# Patient Record
Sex: Female | Born: 1940
Health system: Southern US, Community
[De-identification: ages and names within clinical notes are randomized; demographics above are authoritative.]

## PROBLEM LIST (undated history)

## (undated) DIAGNOSIS — H409 Unspecified glaucoma: Secondary | ICD-10-CM

## (undated) DIAGNOSIS — C801 Malignant (primary) neoplasm, unspecified: Secondary | ICD-10-CM

## (undated) DIAGNOSIS — E119 Type 2 diabetes mellitus without complications: Secondary | ICD-10-CM

## (undated) DIAGNOSIS — R0600 Dyspnea, unspecified: Secondary | ICD-10-CM

## (undated) DIAGNOSIS — E785 Hyperlipidemia, unspecified: Secondary | ICD-10-CM

## (undated) DIAGNOSIS — M1990 Unspecified osteoarthritis, unspecified site: Secondary | ICD-10-CM

## (undated) DIAGNOSIS — F32A Depression, unspecified: Secondary | ICD-10-CM

## (undated) DIAGNOSIS — F329 Major depressive disorder, single episode, unspecified: Secondary | ICD-10-CM

## (undated) DIAGNOSIS — I10 Essential (primary) hypertension: Secondary | ICD-10-CM

## (undated) DIAGNOSIS — Z972 Presence of dental prosthetic device (complete) (partial): Secondary | ICD-10-CM

## (undated) DIAGNOSIS — Z9989 Dependence on other enabling machines and devices: Secondary | ICD-10-CM

## (undated) HISTORY — PX: EYE SURGERY: SHX253

## (undated) HISTORY — PX: ABDOMINAL HYSTERECTOMY: SHX81

---

## 1998-01-18 ENCOUNTER — Other Ambulatory Visit: Admission: RE | Admit: 1998-01-18 | Discharge: 1998-01-18 | Payer: Self-pay | Admitting: Obstetrics and Gynecology

## 2001-10-16 ENCOUNTER — Encounter: Admission: RE | Admit: 2001-10-16 | Discharge: 2001-10-16 | Payer: Self-pay | Admitting: *Deleted

## 2001-10-31 ENCOUNTER — Encounter (INDEPENDENT_AMBULATORY_CARE_PROVIDER_SITE_OTHER): Payer: Self-pay | Admitting: *Deleted

## 2001-10-31 ENCOUNTER — Ambulatory Visit (HOSPITAL_BASED_OUTPATIENT_CLINIC_OR_DEPARTMENT_OTHER): Admission: RE | Admit: 2001-10-31 | Discharge: 2001-10-31 | Payer: Self-pay | Admitting: *Deleted

## 2002-07-02 ENCOUNTER — Encounter: Admission: RE | Admit: 2002-07-02 | Discharge: 2002-07-02 | Payer: Self-pay | Admitting: *Deleted

## 2002-12-26 ENCOUNTER — Encounter: Admission: RE | Admit: 2002-12-26 | Discharge: 2002-12-26 | Payer: Self-pay | Admitting: *Deleted

## 2003-03-09 ENCOUNTER — Ambulatory Visit (HOSPITAL_BASED_OUTPATIENT_CLINIC_OR_DEPARTMENT_OTHER): Admission: RE | Admit: 2003-03-09 | Discharge: 2003-03-09 | Payer: Self-pay | Admitting: Orthopedic Surgery

## 2003-03-09 ENCOUNTER — Ambulatory Visit (HOSPITAL_COMMUNITY): Admission: RE | Admit: 2003-03-09 | Discharge: 2003-03-09 | Payer: Self-pay | Admitting: Orthopedic Surgery

## 2003-03-18 ENCOUNTER — Emergency Department (HOSPITAL_COMMUNITY): Admission: AD | Admit: 2003-03-18 | Discharge: 2003-03-18 | Payer: Self-pay | Admitting: Family Medicine

## 2004-01-04 ENCOUNTER — Encounter: Admission: RE | Admit: 2004-01-04 | Discharge: 2004-01-04 | Payer: Self-pay | Admitting: *Deleted

## 2005-01-27 ENCOUNTER — Encounter: Admission: RE | Admit: 2005-01-27 | Discharge: 2005-01-27 | Payer: Self-pay | Admitting: *Deleted

## 2005-04-10 HISTORY — PX: CERVICAL FUSION: SHX112

## 2005-07-10 ENCOUNTER — Encounter: Admission: RE | Admit: 2005-07-10 | Discharge: 2005-07-10 | Payer: Self-pay | Admitting: Endocrinology

## 2005-07-14 ENCOUNTER — Inpatient Hospital Stay (HOSPITAL_COMMUNITY): Admission: EM | Admit: 2005-07-14 | Discharge: 2005-07-20 | Payer: Self-pay | Admitting: Emergency Medicine

## 2005-07-19 ENCOUNTER — Encounter: Payer: Self-pay | Admitting: Anesthesiology

## 2006-01-31 ENCOUNTER — Encounter: Admission: RE | Admit: 2006-01-31 | Discharge: 2006-01-31 | Payer: Self-pay | Admitting: *Deleted

## 2007-02-12 ENCOUNTER — Encounter: Admission: RE | Admit: 2007-02-12 | Discharge: 2007-02-12 | Payer: Self-pay | Admitting: *Deleted

## 2010-08-26 NOTE — Op Note (Signed)
NAME:  JOSEY, DETTMANN                        ACCOUNT NO.:  [id number]   MEDICAL RECORD NO.:  [id number]                   PATIENT TYPE:  AMB   LOCATION:  DSC                                  FACILITY:  [facility]   PHYSICIAN:  Harvie Junior, M.D.                DATE OF BIRTH:  1940-10-20   DATE OF PROCEDURE:  03/09/2003  DATE OF DISCHARGE:                                 OPERATIVE REPORT   PREOPERATIVE DIAGNOSIS:  Plantar fasciitis left.   POSTOPERATIVE DIAGNOSIS:  Plantar fasciitis left.   PROCEDURE:  Left plantar fascia release, endoscopic.   SURGEON:  Harvie Junior, M.D.   ASSISTANT:  Marshia Ly, P.A.   ANESTHESIA:  General.   INDICATIONS FOR PROCEDURE:  A 70 year old female with a long history of  having significant plantar fascial pain on the left side.  She ultimately  had been evaluated and undergone multiple injection procedures which had  worked, but her pain ultimately recurred with physical therapy that included  stretching and icing.  She had been diligent with all of these by report,  but ultimately had failure of this treatment, so she was brought to the  operating room for endoscopic plantar fascia release.   DESCRIPTION OF PROCEDURE:  The patient was brought to the operating room and  after adequate anesthesia was obtained with a general anesthetic, the  patient was placed supine on the operating table.  The left leg was prepped  and draped in the usual sterile fashion.  Following this, an incision was  made in the skin at the intersection of the skin line at the heel with the  portion of the medial malleolus.  Where these two lines intersected, a small  incision was made, and the skin was dissected down to the level of the heel.  At this point, the cannula was placed through the heel. The plantar fascia  was identified off the superior and inferior surface.  This was freed above  and below and then a cannula was placed just below the endoscopic plantar  fascia.  At this point, a triangular blade was used.  Clearly  endoscopically, the plantar fascia could be identified.  This was then  divided with the triangular blade.  Once this was divided, the fascia could  be seen to gap open.  This was done under endoscopic guidance.  At this  point, the cannula was removed.  The probe was used again to see if there  were any remaining bands of the endoscopic fiber.  There were a few  remaining bands on the lateral side. This was divided with an osteotome and  then a check was made across the plantar aspect of the foot with the  osteotome to make sure that there were no further bands connecting to the  heel.  At this point, the wound was copiously irrigated and suctioned dry.  The portals were then closed with  a single nylon interrupted stitch.  10 mL  of 0.25% Marcaine was instilled into the heel for postoperative pain relief  and the patient was then taken to the recovery room where she was noted to  be in satisfactory condition after a sterile compressive dressing had been  applied.                                               Harvie Junior, M.D.    Ranae Plumber  D:  03/09/2003  T:  03/09/2003  Job:  [id number]

## 2010-08-26 NOTE — Discharge Summary (Signed)
NAME:  JASHLEY, YELLIN              ACCOUNT NO.:  [id number]   MEDICAL RECORD NO.:  [id number]            PATIENT TYPE:   LOCATION:                                 FACILITY:   PHYSICIAN:  Alfonse Alpers. Dagoberto Ligas, M.D.     DATE OF BIRTH:   DATE OF ADMISSION:  DATE OF DISCHARGE:                                 DISCHARGE SUMMARY   This is a 70 year old woman who presents with a history of neck pain.  This  has been present for approximately seven days prior to admission.  It  started rather acutely in the morning.  There is no history of trauma.  A  cervical spine x-ray previously showed some degenerative changes.  She has  had increasing persistence and pain with numbness in her right arm.  She had  significant pain and required pain relief.   She also has a history of diabetes mellitus type 2 requiring insulin and  this has been well controlled with her usual insulin regimen.  She also has  a history of nonspecific chest pain.  This resolved spontaneously.   PHYSICAL EXAMINATION:  Revealed a well-developed woman who was in no  distress but is experiencing severe pain in her neck.  Her lungs were clear  and cardiovascular exam and rhythm was regular.   IMPRESSION:  1. Neck pain secondary to radiculopathy.  2. Type 2 diabetes requiring insulin.   HOSPITAL COURSE:  She was admitted to the hospital and started on  analgesics.  She was seen by the neurosurgical service.  A MRI was done.  During this time, she has still required IV analgesics.  The MR scan showed  a spondylosis and stenosis in the right C6/C7 herniated disc.  She was seen  in consultation by Dr. Kathaleen Maser. Pool who did surgery.  At that point, the  patient was transferred to the service of Dr. Jordan Likes.   DISCHARGE DIAGNOSES:  1. Herniated nucleus pulposus.  2. Diabetes mellitus type 2 requiring insulin.   DISCHARGE MEDICATIONS:  She will continue taking the same medications she  has been taking prior to her admission and other  medications from Dr. Jordan Likes  are to be noted.   PLAN IN FOLLOW-UP:  She will be seen in the office in a period of two to  three weeks.   CONDITION ON DISCHARGE:  Improved.           ______________________________  Alfonse Alpers Dagoberto Ligas, M.D.     CGG/MEDQ  D:  02/24/2006  T:  02/24/2006  Job:  [id number]

## 2010-08-26 NOTE — H&P (Signed)
NAME:  Kayla Griffin, Kayla Griffin              ACCOUNT NO.:  [id number]   MEDICAL RECORD NO.:  [id number]          PATIENT TYPE:  INP   LOCATION:  1417                         FACILITY:  [facility]   PHYSICIAN:  Alfonse Alpers. Gegick, M.D.DATE OF BIRTH:  01-26-1941   DATE OF ADMISSION:  07/14/2005  DATE OF DISCHARGE:                                HISTORY & PHYSICAL   CHIEF COMPLAINT:  This is a 70 year old woman who presents with a history of  neck pain and chest pain.   HISTORY OF PRESENT ILLNESS:  The patient began having symptoms of neck pain  beginning approximately five to seven days prior to this admission.  She  awoke with the pain, did not have any history of trauma, and was seen in the  office.  She had significant discomfort and an x-ray of her cervical spine  was found, and this was consistent with degenerative joint disease.  She was  treated with analgesics, but continues to have severe pain.  The pain is  increasing and the pain is also located in her chest area.  The chest pain  was one of the presenting symptoms when she presented to the emergency room,  and this resolved spontaneously.  The pain was associated with her shoulder  pain on the right side also.  Associated with the pain, is a sense of  numbness in the right hand.   She has a history of type 2 diabetes mellitus.  This has been present since  1974.  Her control has generally been pretty good with a hemoglobin A1C of  6.7%.  Her usual hemoglobin A1C's are in the 7 range or less than 7 range.   She has a history of dyslipidemia, for which she is taking medications, and  also has a history of hypertension and esophageal reflux.  She has  osteopenia with a bone density level T score of  -2.1.   PAST MEDICAL HISTORY:  Essentially negative except for that mentioned above.   FAMILY HISTORY:  Noncontributory.   PERSONAL HISTORY:  She does not smoke or drink excessive amounts of alcohol.   ALLERGIES:  No history of  allergies.   MEDICATIONS PRIOR TO THIS ADMISSION:  1.  Novolin 70/30 mix 15 in the morning and 100 at suppertime.  2.  Actos 45 mg daily.  3.  __________ 10 mcg b.i.d.  4.  Lopressor 100 mg one daily.  5.  Maxzide one daily.  6.  Lipitor 20 mg 1/2 daily.  7.  Aciphex 20 mg one daily.  8.  Enteric coated aspirin 81 mg one daily.   REVIEW OF SYSTEMS:  Her weight has been stable.  CARDIOVASCULAR/RESPIRATORY:  See above.  GASTROINTESTINAL:  See above.  GENITOURINARY:  No complaints.   PHYSICAL EXAMINATION:  GENERAL:  This is a well-developed woman who appears  markedly uncomfortable.  HEENT:  Her head is normocephalic.  NECK:  Supple.  She is able to move her neck without pain.  No tenderness is  present.  LUNGS:  Clear.  BREASTS:  No masses are present.  CARDIOVASCULAR:  Rhythm is regular.  ABDOMEN:  Soft, no tenderness is present.  EXTREMITIES:  Normal.  NEUROMUSCULAR:  Grossly intact.   IMPRESSION:  1.  Cervical spine pain with radiculopathy.  2.  Type 2 diabetes.  3.  Chest pain, not related to cardiac disease, probably secondary to      referred pain from neck.  4.  History of dyslipidemia.  5.  History of hypertension.           ______________________________  Alfonse Alpers Dagoberto Ligas, M.D.     CGG/MEDQ  D:  07/15/2005  T:  07/15/2005  Job:  [id number]

## 2010-08-26 NOTE — Op Note (Signed)
Kayla Griffin, Kayla Griffin              ACCOUNT NO.:  [id number]   MEDICAL RECORD NO.:  [id number]          PATIENT TYPE:  INP   LOCATION:  3016                         FACILITY:  [facility]   PHYSICIAN:  Kathaleen Maser. Pool, M.D.    DATE OF BIRTH:  1940/07/29   DATE OF PROCEDURE:  07/19/2005  DATE OF DISCHARGE:                                 OPERATIVE REPORT   PREOPERATIVE DIAGNOSIS:  C5-6 spondylosis with stenosis and right C6-7  herniated nucleus pulposus with radiculopathy.   POSTOPERATIVE DIAGNOSIS:  C5-6 spondylosis with stenosis and right C6-7  herniated nucleus pulposus with radiculopathy.   PROCEDURE NAME:  C5-6 and C6-7 anterior cervical diskectomy and fusion,  allograft and plating.   SURGEON:  Kathaleen Maser. Pool, M.D.   ASSISTANT:  Donalee Citrin, M.D.   ANESTHESIA:  General orotracheal.   INDICATIONS:  Kayla Griffin is a 70 year old female with history of neck and  right upper extremity pain, paresthesias and weakness consistent with a  right-sided C7 radiculopathy which has failed conservative measures.  Workup  demonstrates evidence of significant spondylosis with stenosis at C5-6, as  well as an acute right-sided C6-7 disk herniation with compression of the  exiting right-sided C7 nerve root.  The patient has been counseled as to her  options.  She has decided to proceed with a two level anterior cervical  diskectomy and fusion with allograft and anterior plating in hopes of  improving her symptoms.   OPERATIVE NOTE:  The patient was taken to the operating room and placed on  the table in a supine position.  After adequate oral anesthesia was  achieved, the patient was positioned supine with her neck slightly extended  and held in place with halter traction.  The patient's anterior cervical  region is prepped and draped sterilely.  A 10-blade was used to make a  linear skin incision overlying the C6 vertebral level.  This was carried  down sharply to the platysma.  The platysma was then  divided vertically and  dissection proceeds along the medial border of the sternocleidomastoid  muscle and carotid sheath.  Trachea and esophagus are mobilized and  retracted towards the left.  Prevertebral fascia is stripped off the  anterior spinal column.  Longus colli muscles are then elevated bilaterally,  using electrocautery.  Deep self-retaining retractor was placed.  Intraoperative fluoroscopy was used and the C5-6 and C6-7 levels were  confirmed.  Disk space was then incised at both levels using a 15-blade in a  rectangular fashion.  A wide disk space clean-out was then achieved using  pituitary rongeurs, forward and backward angled Carlen curets.  Kerrison  rongeurs and high speed drill __________ the disk removed down to the level  of the posterior annulus.  Microscope was then brought into the field and  used throughout the remainder of the diskectomy.  Remaining aspects of  annulus and osteophytes were removed using high speed drill down to the  level of the posterior longitudinal ligament.  The posterior longitudinal  ligament was then elevated and resected in the usual fashion, using Kerrison  rongeurs.  The underlying  thecal sac was identified.  A wide center  decompression was then performed, undercutting the bodies of C5 and C6.  Decompression then proceeded into the internal foramen.  Wide anterior  foraminotomies were then performed along the course of the exiting C6 nerve  roots bilaterally.  Attention was then placed to the C6-7 level.  Once  again, a thorough decompression was achieved, both within the canal and also  with each neural foramen in particular with emphasis upon the right C7 nerve  root.  There was a large amount of free disk herniation which was  encountered along the way off to the right side which was completely  resected.  At this point the wound was then irrigated with antibiotic  solution.  Gelfoam was placed topically and hemostasis found to be  good.  A  6-mm LifeNet allograft wedge was then packed into place and recessed  approximately 1 mm from the anterior vertebral margin at C6-7.  A 5-mm  LifeNet wedge was then packed into place at the C5-6 level.  A 42-mm  Atlantis anterior cervical plate was then placed to the C5, C6 and C7  levels.  This then attached, under fluoroscopic guidance, using 13-mm  variable angle screws, 2 each at all three levels.  All screws were given  final tightening and found to be solid within bone.  A locking screw was  engaged at all three levels.  Final images revealed good position of bone  grafts and hardware __________ with normal alignment of the spine.  The  wound was then irrigated with antibiotic solution.  Hemostasis was ensured  with the bipolar electrocautery and the wound was then closed in typical  fashion.  Steri-Strips and sterile dressing were applied.  There were no  complications.  The patient was doing well and she returns to recovery room  postoperatively.           ______________________________  Kathaleen Maser Pool, M.D.     HAP/MEDQ  D:  07/19/2005  T:  07/20/2005  Job:  [id number]

## 2016-06-13 DIAGNOSIS — H04123 Dry eye syndrome of bilateral lacrimal glands: Secondary | ICD-10-CM | POA: Diagnosis not present

## 2016-06-13 DIAGNOSIS — H401232 Low-tension glaucoma, bilateral, moderate stage: Secondary | ICD-10-CM | POA: Diagnosis not present

## 2016-07-03 ENCOUNTER — Emergency Department (HOSPITAL_COMMUNITY)
Admission: EM | Admit: 2016-07-03 | Discharge: 2016-07-03 | Disposition: A | Discharge status: Home / Self Care | Payer: Medicare HMO | Attending: Emergency Medicine | Admitting: Emergency Medicine

## 2016-07-03 ENCOUNTER — Encounter (HOSPITAL_COMMUNITY): Payer: Self-pay | Admitting: Emergency Medicine

## 2016-07-03 ENCOUNTER — Emergency Department (HOSPITAL_COMMUNITY): Payer: Medicare HMO

## 2016-07-03 DIAGNOSIS — Z79899 Other long term (current) drug therapy: Secondary | ICD-10-CM | POA: Insufficient documentation

## 2016-07-03 DIAGNOSIS — Y929 Unspecified place or not applicable: Secondary | ICD-10-CM | POA: Diagnosis not present

## 2016-07-03 DIAGNOSIS — M25561 Pain in right knee: Secondary | ICD-10-CM | POA: Diagnosis not present

## 2016-07-03 DIAGNOSIS — Z7982 Long term (current) use of aspirin: Secondary | ICD-10-CM | POA: Insufficient documentation

## 2016-07-03 DIAGNOSIS — I10 Essential (primary) hypertension: Secondary | ICD-10-CM | POA: Diagnosis not present

## 2016-07-03 DIAGNOSIS — E119 Type 2 diabetes mellitus without complications: Secondary | ICD-10-CM | POA: Insufficient documentation

## 2016-07-03 DIAGNOSIS — Z794 Long term (current) use of insulin: Secondary | ICD-10-CM | POA: Insufficient documentation

## 2016-07-03 DIAGNOSIS — Y939 Activity, unspecified: Secondary | ICD-10-CM | POA: Insufficient documentation

## 2016-07-03 DIAGNOSIS — W19XXXA Unspecified fall, initial encounter: Secondary | ICD-10-CM | POA: Diagnosis not present

## 2016-07-03 DIAGNOSIS — R52 Pain, unspecified: Secondary | ICD-10-CM | POA: Diagnosis not present

## 2016-07-03 DIAGNOSIS — S8991XA Unspecified injury of right lower leg, initial encounter: Secondary | ICD-10-CM | POA: Diagnosis present

## 2016-07-03 DIAGNOSIS — Y999 Unspecified external cause status: Secondary | ICD-10-CM | POA: Diagnosis not present

## 2016-07-03 DIAGNOSIS — S82031A Displaced transverse fracture of right patella, initial encounter for closed fracture: Secondary | ICD-10-CM | POA: Diagnosis not present

## 2016-07-03 DIAGNOSIS — S82034A Nondisplaced transverse fracture of right patella, initial encounter for closed fracture: Secondary | ICD-10-CM

## 2016-07-03 HISTORY — DX: Type 2 diabetes mellitus without complications: E11.9

## 2016-07-03 HISTORY — DX: Essential (primary) hypertension: I10

## 2016-07-03 HISTORY — DX: Unspecified glaucoma: H40.9

## 2016-07-03 LAB — CBC WITH DIFFERENTIAL/PLATELET
Basophils Absolute: 0 10*3/uL (ref 0.0–0.1)
Basophils Relative: 0 %
Eosinophils Absolute: 0 10*3/uL (ref 0.0–0.7)
Eosinophils Relative: 0 %
HCT: 39.3 % (ref 36.0–46.0)
Hemoglobin: 13.3 g/dL (ref 12.0–15.0)
Lymphocytes Relative: 16 %
Lymphs Abs: 2.2 10*3/uL (ref 0.7–4.0)
MCH: 29.4 pg (ref 26.0–34.0)
MCHC: 33.8 g/dL (ref 30.0–36.0)
MCV: 86.9 fL (ref 78.0–100.0)
Monocytes Absolute: 1 10*3/uL (ref 0.1–1.0)
Monocytes Relative: 8 %
Neutro Abs: 10.4 10*3/uL — ABNORMAL HIGH (ref 1.7–7.7)
Neutrophils Relative %: 76 %
Platelets: 290 10*3/uL (ref 150–400)
RBC: 4.52 MIL/uL (ref 3.87–5.11)
RDW: 13.3 % (ref 11.5–15.5)
WBC: 13.6 10*3/uL — ABNORMAL HIGH (ref 4.0–10.5)

## 2016-07-03 LAB — BASIC METABOLIC PANEL
Anion gap: 9 (ref 5–15)
BUN: 11 mg/dL (ref 6–20)
CO2: 24 mmol/L (ref 22–32)
Calcium: 9.8 mg/dL (ref 8.9–10.3)
Chloride: 97 mmol/L — ABNORMAL LOW (ref 101–111)
Creatinine, Ser: 0.74 mg/dL (ref 0.44–1.00)
GFR calc Af Amer: >60 mL/min (ref >60)
GFR calc non Af Amer: >60 mL/min (ref >60)
Glucose, Bld: 111 mg/dL — ABNORMAL HIGH (ref 65–99)
Potassium: 3.8 mmol/L (ref 3.5–5.1)
Sodium: 130 mmol/L — ABNORMAL LOW (ref 135–145)

## 2016-07-03 MED ORDER — SODIUM CHLORIDE 0.9 % IV BOLUS (SEPSIS): 1000.0000 mL | Freq: Once | INTRAVENOUS | Status: DC

## 2016-07-03 MED ORDER — HYDROCODONE-ACETAMINOPHEN 5-325 MG PO TABS
1.0000 | ORAL_TABLET | Freq: Four times a day (QID) | ORAL | 0 refills | Status: DC | PRN
Start: 2016-07-03 — End: 2016-07-10

## 2016-07-03 NOTE — ED Provider Notes (Signed)
Pt seen and valuated. Discussed with Montine Circle PA-C. Patient here after fall with knee pain. Has patellar fracture on x-rays. Comfortable in immobilizer here. The patient discussed between Dr. Whitman Hero, and PA. He is to be partial weightbearing. Patient can walk with Gilford Rile. We'll have her road test with orthopedic take here. If able to tolerate ambulating and pain will follow-up with Dr. Erlinda Hong in his office tomorrow.   Tanna Furry, MD 07/03/16 [phone]

## 2016-07-03 NOTE — ED Provider Notes (Signed)
[facility] DEPT Provider Note   CSN: [id number] Arrival date & time: 07/03/16  1509     History   Chief Complaint Chief Complaint  Patient presents with  . Knee Pain    HPI Kayla Griffin is a 76 y.o. female.  Patient presents to the emergency department with chief complaint of right knee pain. She states that she fell landing on her right knee today. He complains moderates her pain. The pain is worsened with movement and palpation. She denies any other injuries. She has not taken anything for pain. There are no other modifying factors or associated symptoms.   The history is provided by the patient. No language interpreter was used.    Past Medical History:  Diagnosis Date  . Diabetes mellitus without complication ([place])   . Glaucoma   . Hypertension     There are no active problems to display for this patient.   History reviewed. No pertinent surgical history.  OB History    No data available       Home Medications    Prior to Admission medications   Medication Sig Start Date End Date Taking? Authorizing Provider  aspirin EC 81 MG tablet Take 81 mg by mouth daily.   Yes Historical Provider, MD  atorvastatin (LIPITOR) 40 MG tablet Take 40 mg by mouth daily.  05/16/16  Yes Historical Provider, MD  DORZOLAMIDE HCL OP Place 1 drop into both eyes 2 (two) times daily.   Yes Historical Provider, MD  hydrochlorothiazide (HYDRODIURIL) 25 MG tablet Take 25 mg by mouth 2 (two) times daily.   Yes Historical Provider, MD  insulin NPH-regular Human (NOVOLIN 70/30) (70-30) 100 UNIT/ML injection Inject 30 Units into the skin 2 (two) times daily with a meal.   Yes Historical Provider, MD  latanoprost (XALATAN) 0.005 % ophthalmic solution Place 1 drop into both eyes at bedtime.  06/15/16  Yes Historical Provider, MD  metFORMIN (GLUCOPHAGE) 500 MG tablet Take 500 mg by mouth 2 (two) times daily with a meal.   Yes Historical Provider, MD  metoprolol (LOPRESSOR) 100 MG tablet Take  100 mg by mouth 2 (two) times daily.   Yes Historical Provider, MD  ramipril (ALTACE) 10 MG capsule Take 20 mg by mouth daily.  05/16/16  Yes Historical Provider, MD    Family History No family history on file.  Social History Social History  Substance Use Topics  . Smoking status: Not on file  . Smokeless tobacco: Never Used  . Alcohol use No     Allergies   Actos [pioglitazone]   Review of Systems Review of Systems  All other systems reviewed and are negative.    Physical Exam Updated Vital Signs BP (!) 165/82 (BP Location: Left Arm)   Pulse 66   Temp 98.2 F (36.8 C) (Oral)   Resp 18   SpO2 98%   Physical Exam  Constitutional: She is oriented to person, place, and time. She appears well-developed and well-nourished.  HENT:  Head: Normocephalic and atraumatic.  Eyes: Conjunctivae and EOM are normal. Pupils are equal, round, and reactive to light.  Neck: Normal range of motion. Neck supple.  Cardiovascular: Normal rate and regular rhythm.  Exam reveals no gallop and no friction rub.   No murmur heard. Pulmonary/Chest: Effort normal and breath sounds normal. No respiratory distress. She has no wheezes. She has no rales. She exhibits no tenderness.  Abdominal: Soft. Bowel sounds are normal. She exhibits no distension and no mass. There is no  tenderness. There is no rebound and no guarding.  Musculoskeletal: Normal range of motion. She exhibits no edema or tenderness.  Right knee moderately swollen, mild abrasion, tender to palpation over the patella, no visible deformity, range of motion strength deferred secondary to pain  Neurological: She is alert and oriented to person, place, and time.  Skin: Skin is warm and dry.  Psychiatric: She has a normal mood and affect. Her behavior is normal. Judgment and thought content normal.  Nursing note and vitals reviewed.    ED Treatments / Results  Labs (all labs ordered are listed, but only abnormal results are  displayed) Labs Reviewed - No data to display  EKG  EKG Interpretation None       Radiology Dg Knee Complete 4 Views Right  Result Date: 07/03/2016 CLINICAL DATA:  Fall today.  Right patellar pain. EXAM: RIGHT KNEE - COMPLETE 4+ VIEW COMPARISON:  None. FINDINGS: Comminuted transverse mid to lower right patellar fracture with mild 5 mm distraction of the dominant fracture fragments. Severe overlying soft tissue swelling. No additional fracture. Trace suprapatellar right knee joint effusion. No dislocation. No suspicious focal osseous lesion. Minimal medial and patellofemoral compartment osteoarthritis. No radiopaque foreign body. IMPRESSION: Comminuted transverse mid to lower right patellar fracture. Electronically Signed   By: Ilona Sorrel M.D.   On: 07/03/2016 16:01    Procedures Procedures (including critical care time)  Medications Ordered in ED Medications - No data to display   Initial Impression / Assessment and Plan / ED Course  I have reviewed the triage vital signs and the nursing notes.  Pertinent labs & imaging results that were available during my care of the patient were reviewed by me and considered in my medical decision making (see chart for details).     Patient with patella fracture as pictured above. I discussed case with Dr. Jeneen Rinks, who agrees with orthopedic consultation. Appreciate Dr. Erlinda Hong, For telephone consult. He recommends knee immobilizer and follow-up in his office in the next day or 2. I discussed plan with patient. She was able to maneuver well using a walker. She has a walker at home. She is stable for discharge.  Final Clinical Impressions(s) / ED Diagnoses   Final diagnoses:  Closed nondisplaced transverse fracture of right patella, initial encounter    New Prescriptions New Prescriptions   HYDROCODONE-ACETAMINOPHEN (NORCO/VICODIN) 5-325 MG TABLET    Take 1-2 tablets by mouth every 6 (six) hours as needed.     Montine Circle, PA-C 07/03/16  2011    Tanna Furry, MD 07/12/16 1224

## 2016-07-03 NOTE — ED Triage Notes (Signed)
Per EMS pt complaint of right knee pain with weight bearing post fall.

## 2016-07-04 ENCOUNTER — Ambulatory Visit (INDEPENDENT_AMBULATORY_CARE_PROVIDER_SITE_OTHER): Payer: Medicare HMO | Admitting: Orthopaedic Surgery

## 2016-07-04 ENCOUNTER — Encounter (INDEPENDENT_AMBULATORY_CARE_PROVIDER_SITE_OTHER): Payer: Self-pay | Admitting: Orthopaedic Surgery

## 2016-07-04 DIAGNOSIS — S82031A Displaced transverse fracture of right patella, initial encounter for closed fracture: Secondary | ICD-10-CM

## 2016-07-04 NOTE — Progress Notes (Signed)
   Office Visit Note   Patient: Kayla Griffin           Date of Birth: [birth year]           MRN: [id number] Visit Date: 07/04/2016              Requested by: Aretta Nip, MD [place], [place] [address] PCP: Aretta Nip, MD   Assessment & Plan: Visit Diagnoses:  1. Displaced transverse fracture of right patella, initial encounter for closed fracture     Plan: Displaced right patella fracture likely with associated retinacular tears. Recommend surgical repair with cannulated screws versus partial patellectomy later this week. We discussed the risks benefits alternatives to surgery and she understands and wishes to proceed.  Follow-Up Instructions: Return for 2 week postop visit.   Orders:  No orders of the defined types were placed in this encounter.  No orders of the defined types were placed in this encounter.     Procedures: No procedures performed   Clinical Data: No additional findings.   Subjective: Chief Complaint  Patient presents with  . Right Knee - Pain    Patient 76 year old female with mechanical fall onto her right knee yesterday. X-rays in the ER showed a displaced transverse patella fracture. She has chronic pain with ambulation and inability to extend her knee. She lives alone. The pain does not radiate and is worse with weightbearing    Review of Systems  Constitutional: Negative.   HENT: Negative.   Eyes: Negative.   Respiratory: Negative.   Cardiovascular: Negative.   Endocrine: Negative.   Musculoskeletal: Negative.   Neurological: Negative.   Hematological: Negative.   Psychiatric/Behavioral: Negative.   All other systems reviewed and are negative.    Objective: Vital Signs: There were no vitals taken for this visit.  Physical Exam  Constitutional: She is oriented to person, place, and time. She appears well-developed and well-nourished.  HENT:  Head: Normocephalic and atraumatic.  Eyes: EOM are  normal.  Neck: Neck supple.  Pulmonary/Chest: Effort normal.  Abdominal: Soft.  Neurological: She is alert and oriented to person, place, and time.  Skin: Skin is warm. Capillary refill takes less than 2 seconds.  Psychiatric: She has a normal mood and affect. Her behavior is normal. Judgment and thought content normal.  Nursing note and vitals reviewed.   Ortho Exam Right knee exam shows a superficial anterior abrasions with bruising and swelling. She is not able to maintain a straight leg. She is neurovascular intact. Compartments are soft. Specialty Comments:  No specialty comments available.  Imaging: No results found.   PMFS History: Patient Active Problem List   Diagnosis Date Noted  . Displaced transverse fracture of right patella, initial encounter for closed fracture 07/04/2016   Past Medical History:  Diagnosis Date  . Diabetes mellitus without complication ([place])   . Glaucoma   . Hypertension     No family history on file.  No past surgical history on file. Social History   Occupational History  . Not on file.   Social History Main Topics  . Smoking status: Never Smoker  . Smokeless tobacco: Never Used  . Alcohol use No  . Drug use: No  . Sexual activity: Not on file

## 2016-07-05 ENCOUNTER — Other Ambulatory Visit (INDEPENDENT_AMBULATORY_CARE_PROVIDER_SITE_OTHER): Payer: Self-pay | Admitting: Orthopaedic Surgery

## 2016-07-05 DIAGNOSIS — S82001A Unspecified fracture of right patella, initial encounter for closed fracture: Secondary | ICD-10-CM | POA: Diagnosis not present

## 2016-07-05 DIAGNOSIS — S82031A Displaced transverse fracture of right patella, initial encounter for closed fracture: Secondary | ICD-10-CM

## 2016-07-06 ENCOUNTER — Encounter (HOSPITAL_COMMUNITY): Payer: Self-pay | Admitting: *Deleted

## 2016-07-06 NOTE — Progress Notes (Addendum)
Kayla Griffin denies chest pain.   Kayla Griffin is seen by Dr Buddy Duty for diabetes, states last A1C was 6.8 six months ago.  PCP is Dr Jordan Hawks [name] at [place] , [place]  Patient states that she checks CBG in pm only, last evening it was 75.  I instructed patient to check CBG to check CBG and if it is less than 70 to treat it with Glucose Gel, Glucose tablets or 1/2 cup of clear juice like apple juice or cranberry juice, or 1/2 cup of regular soda. (not cream soda). I instructed patient to recheck CBG in 15 minutes and if CBG is not greater than 70, to  Call [phone] (pre- op). If it is before pre-op opens to retreat as before and recheck CBG in 15 minutes. I told patient to make note of time that liquid is taken and amount, that surgical time may have to be adjusted.   Patient states that she does not have anyone to stay with her the 1st 24 hours after surgery.  I left a message on Kayla Griffin's voice mail at Dr Phoebe Sharps office.

## 2016-07-07 ENCOUNTER — Encounter (HOSPITAL_COMMUNITY): Payer: Self-pay | Admitting: *Deleted

## 2016-07-07 ENCOUNTER — Observation Stay (HOSPITAL_COMMUNITY): Payer: Medicare HMO

## 2016-07-07 ENCOUNTER — Observation Stay (HOSPITAL_COMMUNITY)
Admission: RE | Admit: 2016-07-07 | Discharge: 2016-07-10 | Disposition: A | Discharge status: Home / Self Care | Payer: Medicare HMO | Source: Ambulatory Visit | Attending: Orthopaedic Surgery | Admitting: Orthopaedic Surgery

## 2016-07-07 ENCOUNTER — Ambulatory Visit (HOSPITAL_COMMUNITY): Payer: Medicare HMO | Admitting: Anesthesiology

## 2016-07-07 ENCOUNTER — Other Ambulatory Visit: Payer: Self-pay | Admitting: Cardiology

## 2016-07-07 ENCOUNTER — Encounter (HOSPITAL_COMMUNITY)
Admission: RE | Disposition: A | Discharge status: Home / Self Care | Payer: Self-pay | Source: Ambulatory Visit | Attending: Orthopaedic Surgery

## 2016-07-07 DIAGNOSIS — Z8249 Family history of ischemic heart disease and other diseases of the circulatory system: Secondary | ICD-10-CM | POA: Insufficient documentation

## 2016-07-07 DIAGNOSIS — M1990 Unspecified osteoarthritis, unspecified site: Secondary | ICD-10-CM | POA: Insufficient documentation

## 2016-07-07 DIAGNOSIS — S82031A Displaced transverse fracture of right patella, initial encounter for closed fracture: Secondary | ICD-10-CM

## 2016-07-07 DIAGNOSIS — Z794 Long term (current) use of insulin: Secondary | ICD-10-CM | POA: Insufficient documentation

## 2016-07-07 DIAGNOSIS — S82041A Displaced comminuted fracture of right patella, initial encounter for closed fracture: Principal | ICD-10-CM | POA: Insufficient documentation

## 2016-07-07 DIAGNOSIS — Z888 Allergy status to other drugs, medicaments and biological substances status: Secondary | ICD-10-CM | POA: Diagnosis not present

## 2016-07-07 DIAGNOSIS — F329 Major depressive disorder, single episode, unspecified: Secondary | ICD-10-CM | POA: Diagnosis not present

## 2016-07-07 DIAGNOSIS — G8918 Other acute postprocedural pain: Secondary | ICD-10-CM | POA: Diagnosis not present

## 2016-07-07 DIAGNOSIS — H409 Unspecified glaucoma: Secondary | ICD-10-CM | POA: Diagnosis not present

## 2016-07-07 DIAGNOSIS — Z7982 Long term (current) use of aspirin: Secondary | ICD-10-CM | POA: Insufficient documentation

## 2016-07-07 DIAGNOSIS — R69 Illness, unspecified: Secondary | ICD-10-CM | POA: Diagnosis not present

## 2016-07-07 DIAGNOSIS — E785 Hyperlipidemia, unspecified: Secondary | ICD-10-CM | POA: Insufficient documentation

## 2016-07-07 DIAGNOSIS — Z981 Arthrodesis status: Secondary | ICD-10-CM | POA: Diagnosis not present

## 2016-07-07 DIAGNOSIS — Z8542 Personal history of malignant neoplasm of other parts of uterus: Secondary | ICD-10-CM | POA: Insufficient documentation

## 2016-07-07 DIAGNOSIS — I10 Essential (primary) hypertension: Secondary | ICD-10-CM | POA: Insufficient documentation

## 2016-07-07 DIAGNOSIS — Z90710 Acquired absence of both cervix and uterus: Secondary | ICD-10-CM | POA: Insufficient documentation

## 2016-07-07 DIAGNOSIS — Z9889 Other specified postprocedural states: Secondary | ICD-10-CM

## 2016-07-07 DIAGNOSIS — S82001A Unspecified fracture of right patella, initial encounter for closed fracture: Secondary | ICD-10-CM | POA: Diagnosis not present

## 2016-07-07 DIAGNOSIS — Z79899 Other long term (current) drug therapy: Secondary | ICD-10-CM | POA: Diagnosis not present

## 2016-07-07 DIAGNOSIS — X58XXXA Exposure to other specified factors, initial encounter: Secondary | ICD-10-CM | POA: Insufficient documentation

## 2016-07-07 DIAGNOSIS — R0600 Dyspnea, unspecified: Secondary | ICD-10-CM | POA: Insufficient documentation

## 2016-07-07 DIAGNOSIS — Y939 Activity, unspecified: Secondary | ICD-10-CM | POA: Diagnosis not present

## 2016-07-07 DIAGNOSIS — Z823 Family history of stroke: Secondary | ICD-10-CM | POA: Diagnosis not present

## 2016-07-07 DIAGNOSIS — E119 Type 2 diabetes mellitus without complications: Secondary | ICD-10-CM | POA: Diagnosis not present

## 2016-07-07 DIAGNOSIS — Z419 Encounter for procedure for purposes other than remedying health state, unspecified: Secondary | ICD-10-CM

## 2016-07-07 DIAGNOSIS — R9431 Abnormal electrocardiogram [ECG] [EKG]: Secondary | ICD-10-CM

## 2016-07-07 HISTORY — DX: Malignant (primary) neoplasm, unspecified: C80.1

## 2016-07-07 HISTORY — DX: Unspecified osteoarthritis, unspecified site: M19.90

## 2016-07-07 HISTORY — DX: Depression, unspecified: F32.A

## 2016-07-07 HISTORY — DX: Dyspnea, unspecified: R06.00

## 2016-07-07 HISTORY — PX: ORIF PATELLA: SHX5033

## 2016-07-07 HISTORY — DX: Major depressive disorder, single episode, unspecified: F32.9

## 2016-07-07 LAB — GLUCOSE, CAPILLARY
Glucose-Capillary: 170 mg/dL — ABNORMAL HIGH (ref 65–99)
Glucose-Capillary: 156 mg/dL — ABNORMAL HIGH (ref 65–99)
Glucose-Capillary: 150 mg/dL — ABNORMAL HIGH (ref 65–99)
Glucose-Capillary: 140 mg/dL — ABNORMAL HIGH (ref 65–99)
Glucose-Capillary: 241 mg/dL — ABNORMAL HIGH (ref 65–99)

## 2016-07-07 SURGERY — OPEN REDUCTION INTERNAL FIXATION (ORIF) PATELLA
Anesthesia: General | Site: Knee | Laterality: Right

## 2016-07-07 MED ORDER — SUCCINYLCHOLINE CHLORIDE 200 MG/10ML IV SOSY
PREFILLED_SYRINGE | INTRAVENOUS | Status: AC
  Filled 2016-07-07: qty 10

## 2016-07-07 MED ORDER — ONDANSETRON HCL 4 MG PO TABS: 4.0000 mg | ORAL_TABLET | Freq: Three times a day (TID) | ORAL | 0 refills | Status: DC | PRN

## 2016-07-07 MED ORDER — PROMETHAZINE HCL 25 MG/ML IJ SOLN: 6.2500 mg | INTRAMUSCULAR | Status: DC | PRN

## 2016-07-07 MED ORDER — PHENYLEPHRINE 40 MCG/ML (10ML) SYRINGE FOR IV PUSH (FOR BLOOD PRESSURE SUPPORT)
PREFILLED_SYRINGE | INTRAVENOUS | Status: AC
  Filled 2016-07-07: qty 10

## 2016-07-07 MED ORDER — FENTANYL CITRATE (PF) 250 MCG/5ML IJ SOLN
INTRAMUSCULAR | Status: AC
  Filled 2016-07-07: qty 5

## 2016-07-07 MED ORDER — SUCCINYLCHOLINE CHLORIDE 20 MG/ML IJ SOLN
INTRAMUSCULAR | Status: DC | PRN
Start: 2016-07-07 — End: 2016-07-07
  Administered 2016-07-07: 120 mg via INTRAVENOUS

## 2016-07-07 MED ORDER — METOPROLOL TARTRATE 100 MG PO TABS
100.0000 mg | ORAL_TABLET | Freq: Two times a day (BID) | ORAL | Status: DC
  Administered 2016-07-07 – 2016-07-10 (×6): 100 mg via ORAL
  Filled 2016-07-07 (×6): qty 1

## 2016-07-07 MED ORDER — ASPIRIN EC 325 MG PO TBEC: 325.0000 mg | DELAYED_RELEASE_TABLET | Freq: Every day | ORAL | 0 refills | Status: DC

## 2016-07-07 MED ORDER — INSULIN ASPART 100 UNIT/ML ~~LOC~~ SOLN
0.0000 [IU] | Freq: Three times a day (TID) | SUBCUTANEOUS | Status: DC
  Administered 2016-07-08 – 2016-07-09 (×4): 4 [IU] via SUBCUTANEOUS
  Administered 2016-07-09: 7 [IU] via SUBCUTANEOUS
  Administered 2016-07-09: 4 [IU] via SUBCUTANEOUS
  Administered 2016-07-10: 7 [IU] via SUBCUTANEOUS
  Administered 2016-07-10: 4 [IU] via SUBCUTANEOUS

## 2016-07-07 MED ORDER — HYDROCHLOROTHIAZIDE 25 MG PO TABS
25.0000 mg | ORAL_TABLET | Freq: Every day | ORAL | Status: DC
  Administered 2016-07-07 – 2016-07-10 (×4): 25 mg via ORAL
  Filled 2016-07-07 (×4): qty 1

## 2016-07-07 MED ORDER — 0.9 % SODIUM CHLORIDE (POUR BTL) OPTIME
TOPICAL | Status: DC | PRN
  Administered 2016-07-07: 3000 mL

## 2016-07-07 MED ORDER — PROMETHAZINE HCL 25 MG PO TABS: 25.0000 mg | ORAL_TABLET | Freq: Four times a day (QID) | ORAL | 1 refills | Status: DC | PRN

## 2016-07-07 MED ORDER — MIDAZOLAM HCL 2 MG/2ML IJ SOLN
INTRAMUSCULAR | Status: AC
  Filled 2016-07-07: qty 2

## 2016-07-07 MED ORDER — LIDOCAINE 2% (20 MG/ML) 5 ML SYRINGE
INTRAMUSCULAR | Status: AC
  Filled 2016-07-07: qty 5

## 2016-07-07 MED ORDER — METHOCARBAMOL 750 MG PO TABS: 750.0000 mg | ORAL_TABLET | Freq: Two times a day (BID) | ORAL | 0 refills | Status: DC | PRN

## 2016-07-07 MED ORDER — OXYCODONE HCL 5 MG PO TABS
5.0000 mg | ORAL_TABLET | ORAL | Status: DC | PRN
  Administered 2016-07-08 – 2016-07-09 (×8): 10 mg via ORAL
  Filled 2016-07-07 (×8): qty 2

## 2016-07-07 MED ORDER — ROCURONIUM BROMIDE 100 MG/10ML IV SOLN
INTRAVENOUS | Status: DC | PRN
  Administered 2016-07-07: 30 mg via INTRAVENOUS

## 2016-07-07 MED ORDER — CEFAZOLIN SODIUM-DEXTROSE 2-4 GM/100ML-% IV SOLN
2.0000 g | INTRAVENOUS | Status: AC
  Administered 2016-07-07: 2 g via INTRAVENOUS

## 2016-07-07 MED ORDER — ONDANSETRON HCL 4 MG/2ML IJ SOLN: 4.0000 mg | Freq: Once | INTRAMUSCULAR | Status: DC | PRN

## 2016-07-07 MED ORDER — ONDANSETRON HCL 4 MG PO TABS: 4.0000 mg | ORAL_TABLET | Freq: Four times a day (QID) | ORAL | Status: DC | PRN

## 2016-07-07 MED ORDER — SORBITOL 70 % SOLN: 30.0000 mL | Freq: Every day | Status: DC | PRN

## 2016-07-07 MED ORDER — SODIUM CHLORIDE 0.9 % IV SOLN
INTRAVENOUS | Status: DC
  Administered 2016-07-07 – 2016-07-08 (×3): via INTRAVENOUS

## 2016-07-07 MED ORDER — FENTANYL CITRATE (PF) 100 MCG/2ML IJ SOLN
25.0000 ug | INTRAMUSCULAR | Status: DC | PRN
  Administered 2016-07-07 (×2): 25 ug via INTRAVENOUS
  Administered 2016-07-07: 50 ug via INTRAVENOUS

## 2016-07-07 MED ORDER — ONDANSETRON HCL 4 MG/2ML IJ SOLN
INTRAMUSCULAR | Status: AC
  Filled 2016-07-07: qty 2

## 2016-07-07 MED ORDER — OXYCODONE HCL 5 MG PO TABS: 5.0000 mg | ORAL_TABLET | Freq: Once | ORAL | Status: DC | PRN

## 2016-07-07 MED ORDER — METOCLOPRAMIDE HCL 5 MG PO TABS: 5.0000 mg | ORAL_TABLET | Freq: Three times a day (TID) | ORAL | Status: DC | PRN

## 2016-07-07 MED ORDER — MIDAZOLAM HCL 5 MG/5ML IJ SOLN
INTRAMUSCULAR | Status: DC | PRN
  Administered 2016-07-07: 1 mg via INTRAVENOUS

## 2016-07-07 MED ORDER — OXYCODONE HCL 5 MG/5ML PO SOLN: 5.0000 mg | Freq: Once | ORAL | Status: DC | PRN

## 2016-07-07 MED ORDER — ROCURONIUM BROMIDE 50 MG/5ML IV SOSY
PREFILLED_SYRINGE | INTRAVENOUS | Status: AC
  Filled 2016-07-07: qty 5

## 2016-07-07 MED ORDER — METHOCARBAMOL 500 MG PO TABS
500.0000 mg | ORAL_TABLET | Freq: Four times a day (QID) | ORAL | Status: DC | PRN
  Administered 2016-07-09: 500 mg via ORAL
  Filled 2016-07-07 (×2): qty 1

## 2016-07-07 MED ORDER — KETOROLAC TROMETHAMINE 30 MG/ML IJ SOLN
30.0000 mg | Freq: Four times a day (QID) | INTRAMUSCULAR | Status: AC | PRN
  Administered 2016-07-07: 30 mg via INTRAVENOUS

## 2016-07-07 MED ORDER — INSULIN ASPART 100 UNIT/ML ~~LOC~~ SOLN
0.0000 [IU] | Freq: Every day | SUBCUTANEOUS | Status: DC
  Administered 2016-07-07: 2 [IU] via SUBCUTANEOUS

## 2016-07-07 MED ORDER — SUGAMMADEX SODIUM 500 MG/5ML IV SOLN
INTRAVENOUS | Status: DC | PRN
  Administered 2016-07-07: 400 mg via INTRAVENOUS

## 2016-07-07 MED ORDER — FENTANYL CITRATE (PF) 100 MCG/2ML IJ SOLN
INTRAMUSCULAR | Status: AC
  Filled 2016-07-07: qty 2

## 2016-07-07 MED ORDER — PHENYLEPHRINE HCL 10 MG/ML IJ SOLN
INTRAMUSCULAR | Status: DC | PRN
  Administered 2016-07-07: 80 ug via INTRAVENOUS

## 2016-07-07 MED ORDER — LACTATED RINGERS IV SOLN
INTRAVENOUS | Status: DC
  Administered 2016-07-07 (×2): via INTRAVENOUS

## 2016-07-07 MED ORDER — SENNOSIDES-DOCUSATE SODIUM 8.6-50 MG PO TABS: 1.0000 | ORAL_TABLET | Freq: Every evening | ORAL | 1 refills | Status: DC | PRN

## 2016-07-07 MED ORDER — PROPOFOL 10 MG/ML IV BOLUS
INTRAVENOUS | Status: AC
  Filled 2016-07-07: qty 20

## 2016-07-07 MED ORDER — ATORVASTATIN CALCIUM 40 MG PO TABS
40.0000 mg | ORAL_TABLET | Freq: Every day | ORAL | Status: DC
  Administered 2016-07-08 – 2016-07-10 (×3): 40 mg via ORAL
  Filled 2016-07-07 (×3): qty 1

## 2016-07-07 MED ORDER — MEPERIDINE HCL 25 MG/ML IJ SOLN: 6.2500 mg | INTRAMUSCULAR | Status: DC | PRN

## 2016-07-07 MED ORDER — MAGNESIUM CITRATE PO SOLN: 1.0000 | Freq: Once | ORAL | Status: DC | PRN

## 2016-07-07 MED ORDER — RAMIPRIL 10 MG PO CAPS
20.0000 mg | ORAL_CAPSULE | Freq: Every day | ORAL | Status: DC
  Administered 2016-07-08 – 2016-07-10 (×3): 20 mg via ORAL
  Filled 2016-07-07 (×4): qty 2

## 2016-07-07 MED ORDER — POLYETHYLENE GLYCOL 3350 17 G PO PACK
17.0000 g | PACK | Freq: Every day | ORAL | Status: DC | PRN
  Administered 2016-07-09: 17 g via ORAL
  Filled 2016-07-07: qty 1

## 2016-07-07 MED ORDER — ACETAMINOPHEN 325 MG PO TABS
650.0000 mg | ORAL_TABLET | Freq: Four times a day (QID) | ORAL | Status: DC | PRN
  Filled 2016-07-07: qty 2

## 2016-07-07 MED ORDER — OXYCODONE HCL ER 10 MG PO T12A: 10.0000 mg | EXTENDED_RELEASE_TABLET | Freq: Two times a day (BID) | ORAL | 0 refills | Status: DC

## 2016-07-07 MED ORDER — DIPHENHYDRAMINE HCL 12.5 MG/5ML PO ELIX: 25.0000 mg | ORAL_SOLUTION | ORAL | Status: DC | PRN

## 2016-07-07 MED ORDER — MORPHINE SULFATE (PF) 2 MG/ML IV SOLN: 1.0000 mg | INTRAVENOUS | Status: DC | PRN

## 2016-07-07 MED ORDER — DEXAMETHASONE SODIUM PHOSPHATE 10 MG/ML IJ SOLN
INTRAMUSCULAR | Status: AC
  Filled 2016-07-07: qty 1

## 2016-07-07 MED ORDER — CEFAZOLIN SODIUM-DEXTROSE 2-4 GM/100ML-% IV SOLN
INTRAVENOUS | Status: AC
  Filled 2016-07-07: qty 100

## 2016-07-07 MED ORDER — ASPIRIN EC 325 MG PO TBEC
325.0000 mg | DELAYED_RELEASE_TABLET | Freq: Every day | ORAL | Status: DC
  Administered 2016-07-08 – 2016-07-10 (×3): 325 mg via ORAL
  Filled 2016-07-07 (×3): qty 1

## 2016-07-07 MED ORDER — KETOROLAC TROMETHAMINE 30 MG/ML IJ SOLN
INTRAMUSCULAR | Status: AC
  Filled 2016-07-07: qty 1

## 2016-07-07 MED ORDER — HYDROMORPHONE HCL 1 MG/ML IJ SOLN: 0.2500 mg | INTRAMUSCULAR | Status: DC | PRN

## 2016-07-07 MED ORDER — METOCLOPRAMIDE HCL 5 MG/ML IJ SOLN: 5.0000 mg | Freq: Three times a day (TID) | INTRAMUSCULAR | Status: DC | PRN

## 2016-07-07 MED ORDER — ONDANSETRON HCL 4 MG/2ML IJ SOLN: 4.0000 mg | Freq: Four times a day (QID) | INTRAMUSCULAR | Status: DC | PRN

## 2016-07-07 MED ORDER — OXYCODONE-ACETAMINOPHEN 5-325 MG PO TABS: 1.0000 | ORAL_TABLET | ORAL | 0 refills | Status: DC | PRN

## 2016-07-07 MED ORDER — LATANOPROST 0.005 % OP SOLN
1.0000 [drp] | Freq: Every day | OPHTHALMIC | Status: DC
  Administered 2016-07-08 – 2016-07-09 (×2): 1 [drp] via OPHTHALMIC
  Filled 2016-07-07: qty 2.5

## 2016-07-07 MED ORDER — LIDOCAINE HCL (CARDIAC) 20 MG/ML IV SOLN
INTRAVENOUS | Status: DC | PRN
  Administered 2016-07-07: 60 mg via INTRAVENOUS

## 2016-07-07 MED ORDER — ACETAMINOPHEN 650 MG RE SUPP: 650.0000 mg | Freq: Four times a day (QID) | RECTAL | Status: DC | PRN

## 2016-07-07 MED ORDER — PROPOFOL 10 MG/ML IV BOLUS
INTRAVENOUS | Status: DC | PRN
  Administered 2016-07-07: 130 mg via INTRAVENOUS

## 2016-07-07 MED ORDER — METHOCARBAMOL 1000 MG/10ML IJ SOLN
500.0000 mg | Freq: Four times a day (QID) | INTRAVENOUS | Status: DC | PRN
  Filled 2016-07-07: qty 5

## 2016-07-07 MED ORDER — FENTANYL CITRATE (PF) 100 MCG/2ML IJ SOLN
INTRAMUSCULAR | Status: DC | PRN
  Administered 2016-07-07: 100 ug via INTRAVENOUS

## 2016-07-07 MED ORDER — CEFAZOLIN SODIUM-DEXTROSE 2-4 GM/100ML-% IV SOLN
2.0000 g | Freq: Four times a day (QID) | INTRAVENOUS | Status: AC
  Administered 2016-07-07 – 2016-07-08 (×3): 2 g via INTRAVENOUS
  Filled 2016-07-07 (×3): qty 100

## 2016-07-07 SURGICAL SUPPLY — 62 items
BANDAGE ACE 4X5 VEL STRL LF (GAUZE/BANDAGES/DRESSINGS) ×1 IMPLANT
BANDAGE ACE 6X5 VEL STRL LF (GAUZE/BANDAGES/DRESSINGS) ×1 IMPLANT
BIT DRILL QC 2.7 6.3IN  SHORT (BIT) ×1
BIT DRILL QC 2.7 6.3IN SHORT (BIT) IMPLANT
BNDG COHESIVE 4X5 TAN STRL (GAUZE/BANDAGES/DRESSINGS) IMPLANT
BNDG COHESIVE 6X5 TAN STRL LF (GAUZE/BANDAGES/DRESSINGS) ×2 IMPLANT
COVER SURGICAL LIGHT HANDLE (MISCELLANEOUS) ×2 IMPLANT
CUFF TOURNIQUET SINGLE 34IN LL (TOURNIQUET CUFF) ×1 IMPLANT
CUFF TOURNIQUET SINGLE 44IN (TOURNIQUET CUFF) IMPLANT
DRAPE C-ARM 42X72 X-RAY (DRAPES) ×1 IMPLANT
DRAPE C-ARMOR (DRAPES) ×1 IMPLANT
DRAPE IMP U-DRAPE 54X76 (DRAPES) ×2 IMPLANT
DRAPE INCISE IOBAN 66X45 STRL (DRAPES) ×2 IMPLANT
DRAPE U-SHAPE 47X51 STRL (DRAPES) ×1 IMPLANT
DRSG PAD ABDOMINAL 8X10 ST (GAUZE/BANDAGES/DRESSINGS) ×1 IMPLANT
DURAPREP 26ML APPLICATOR (WOUND CARE) ×2 IMPLANT
ELECT CAUTERY BLADE 6.4 (BLADE) ×2 IMPLANT
ELECT REM PT RETURN 9FT ADLT (ELECTROSURGICAL) ×2
ELECTRODE REM PT RTRN 9FT ADLT (ELECTROSURGICAL) ×1 IMPLANT
FACESHIELD WRAPAROUND (MASK) IMPLANT
FACESHIELD WRAPAROUND OR TEAM (MASK) IMPLANT
GAUZE SPONGE 4X4 12PLY STRL (GAUZE/BANDAGES/DRESSINGS) ×2 IMPLANT
GAUZE SPONGE 4X4 12PLY STRL LF (GAUZE/BANDAGES/DRESSINGS) ×1 IMPLANT
GAUZE XEROFORM 5X9 LF (GAUZE/BANDAGES/DRESSINGS) ×1 IMPLANT
GLOVE SKINSENSE NS SZ7.5 (GLOVE) ×2
GLOVE SKINSENSE STRL SZ7.5 (GLOVE) ×2 IMPLANT
GOWN STRL REIN XL XLG (GOWN DISPOSABLE) ×6 IMPLANT
K-WIRE 2.0 (WIRE) ×4
K-WIRE TROCAR PT 2.0 150MM (WIRE) ×4
KIT BASIN OR (CUSTOM PROCEDURE TRAY) ×2 IMPLANT
KIT ROOM TURNOVER OR (KITS) ×2 IMPLANT
KWIRE TROCAR PT 2.0 150 (WIRE) IMPLANT
KWIRE TROCAR PT 2.0 150MM (WIRE) ×4 IMPLANT
NDL HYPO 25GX1X1/2 BEV (NEEDLE) IMPLANT
NEEDLE HYPO 25GX1X1/2 BEV (NEEDLE) ×2 IMPLANT
NS IRRIG 1000ML POUR BTL (IV SOLUTION) ×2 IMPLANT
PACK ORTHO EXTREMITY (CUSTOM PROCEDURE TRAY) ×2 IMPLANT
PAD ARMBOARD 7.5X6 YLW CONV (MISCELLANEOUS) ×4 IMPLANT
PAD CAST 3X4 CTTN HI CHSV (CAST SUPPLIES) IMPLANT
PAD CAST 4YDX4 CTTN HI CHSV (CAST SUPPLIES) IMPLANT
PADDING CAST COTTON 3X4 STRL (CAST SUPPLIES)
PADDING CAST COTTON 4X4 STRL (CAST SUPPLIES) ×2
PADDING CAST COTTON 6X4 STRL (CAST SUPPLIES) ×1 IMPLANT
PASSER SUT SWANSON 36MM LOOP (INSTRUMENTS) IMPLANT
SPONGE LAP 18X18 X RAY DECT (DISPOSABLE) ×2 IMPLANT
STOCKINETTE IMPERVIOUS LG (DRAPES) ×2 IMPLANT
SUCTION FRAZIER HANDLE 10FR (MISCELLANEOUS) ×1
SUCTION TUBE FRAZIER 10FR DISP (MISCELLANEOUS) ×1 IMPLANT
SUT ETHIBOND 5 LR DA (SUTURE) ×6 IMPLANT
SUT ETHILON 2 0 FS 18 (SUTURE) IMPLANT
SUT ETHILON 3 0 PS 1 (SUTURE) IMPLANT
SUT FIBERWIRE #2 38 T-5 BLUE (SUTURE)
SUT VIC AB 0 CT1 27 (SUTURE)
SUT VIC AB 0 CT1 27XBRD ANBCTR (SUTURE) IMPLANT
SUT VIC AB 2-0 CT1 27 (SUTURE)
SUT VIC AB 2-0 CT1 TAPERPNT 27 (SUTURE) IMPLANT
SUTURE FIBERWR #2 38 T-5 BLUE (SUTURE) IMPLANT
SYR CONTROL 10ML LL (SYRINGE) IMPLANT
TOWEL OR 17X24 6PK STRL BLUE (TOWEL DISPOSABLE) ×2 IMPLANT
TOWEL OR 17X26 10 PK STRL BLUE (TOWEL DISPOSABLE) ×4 IMPLANT
TUBE CONNECTING 12X1/4 (SUCTIONS) ×2 IMPLANT
WATER STERILE IRR 1000ML POUR (IV SOLUTION) ×2 IMPLANT

## 2016-07-07 NOTE — Transfer of Care (Signed)
Immediate Anesthesia Transfer of Care Note  Patient: Kayla Griffin  Procedure(s) Performed: Procedure(s): Partial Patellectomy (Right)  Patient Location: PACU  Anesthesia Type:General  Level of Consciousness: awake and alert   Airway & Oxygen Therapy: Patient Spontanous Breathing and Patient connected to nasal cannula oxygen  Post-op Assessment: Report given to RN and Post -op Vital signs reviewed and stable  Post vital signs: Reviewed and stable  Last Vitals:  Vitals:   07/07/16 0648 07/07/16 1548  BP: (!) 145/53 137/60  Pulse: 67 66  Resp: 14 19  Temp: 37.1 C 36.6 C    Last Pain:  Vitals:   07/07/16 1548  TempSrc:   PainSc: 0-No pain      Patients Stated Pain Goal: 7 (76/19/50 9326)  Complications: No apparent anesthesia complications

## 2016-07-07 NOTE — H&P (Signed)
PREOPERATIVE H&P  Chief Complaint: right patella fracture  HPI: Kayla Griffin is a 76 y.o. female who presents for surgical treatment of right patella fracture.  She denies any changes in medical history.  Past Medical History:  Diagnosis Date  . Arthritis   . Cancer ([place])    Intermetrial CA  . Depression   . Diabetes mellitus without complication ([place])    Type II  . Dyspnea    due to pain  . Glaucoma   . Hypertension    Past Surgical History:  Procedure Laterality Date  . ABDOMINAL HYSTERECTOMY    . CERVICAL FUSION  2007  . EYE SURGERY Bilateral    Social History   Social History  . Marital status: Single    Spouse name: N/A  . Number of children: N/A  . Years of education: N/A   Social History Main Topics  . Smoking status: Never Smoker  . Smokeless tobacco: Never Used  . Alcohol use No  . Drug use: No  . Sexual activity: Not Asked   Other Topics Concern  . None   Social History Narrative  . None   History reviewed. No pertinent family history. Allergies  Allergen Reactions  . Actos [Pioglitazone] Swelling    SWELLING REACTION UNSPECIFIED    Prior to Admission medications   Medication Sig Start Date End Date Taking? Authorizing Provider  aspirin EC 81 MG tablet Take 81 mg by mouth daily.   Yes Historical Provider, MD  atorvastatin (LIPITOR) 40 MG tablet Take 40 mg by mouth daily.  05/16/16  Yes Historical Provider, MD  DORZOLAMIDE HCL OP Place 1 drop into both eyes 2 (two) times daily.   Yes Historical Provider, MD  hydrochlorothiazide (HYDRODIURIL) 25 MG tablet Take 25 mg by mouth daily.    Yes Historical Provider, MD  HYDROcodone-acetaminophen (NORCO/VICODIN) 5-325 MG tablet Take 1-2 tablets by mouth every 6 (six) hours as needed. 07/03/16  Yes Montine Circle, PA-C  insulin NPH-regular Human (NOVOLIN 70/30) (70-30) 100 UNIT/ML injection Inject 30 Units into the skin 2 (two) times daily with a meal.   Yes Historical Provider, MD  latanoprost  (XALATAN) 0.005 % ophthalmic solution Place 1 drop into both eyes at bedtime.  06/15/16  Yes Historical Provider, MD  metFORMIN (GLUCOPHAGE) 500 MG tablet Take 500 mg by mouth 2 (two) times daily with a meal.   Yes Historical Provider, MD  metoprolol (LOPRESSOR) 100 MG tablet Take 100 mg by mouth 2 (two) times daily.   Yes Historical Provider, MD  ramipril (ALTACE) 10 MG capsule Take 20 mg by mouth daily.  05/16/16  Yes Historical Provider, MD     Positive ROS: All other systems have been reviewed and were otherwise negative with the exception of those mentioned in the HPI and as above.  Physical Exam: General: Alert, no acute distress Cardiovascular: No pedal edema Respiratory: No cyanosis, no use of accessory musculature GI: abdomen soft Skin: No lesions in the area of chief complaint Neurologic: Sensation intact distally Psychiatric: Patient is competent for consent with normal mood and affect Lymphatic: no lymphedema  MUSCULOSKELETAL: exam stable  Assessment: right patella fracture  Plan: Plan for Procedure(s): OPEN REDUCTION INTERNAL (ORIF) FIXATION  RIGHT PATELLA VERSUS PARTIAL PATELLECTOMY  The risks benefits and alternatives were discussed with the patient including but not limited to the risks of nonoperative treatment, versus surgical intervention including infection, bleeding, nerve injury,  blood clots, cardiopulmonary complications, morbidity, mortality, among others, and they were willing to  proceed.   Eduard Roux, MD   07/07/2016 7:15 AM

## 2016-07-07 NOTE — Anesthesia Postprocedure Evaluation (Signed)
Anesthesia Post Note  Patient: Kayla Griffin  Procedure(s) Performed: Procedure(s) (LRB): Partial Patellectomy (Right)  Anesthesia Type: General       Last Vitals:  Vitals:   07/07/16 1719 07/07/16 1728  BP: (!) 162/70 (!) 160/55  Pulse: 78 83  Resp: (!) 22 18  Temp:  36.3 C    Last Pain:  Vitals:   07/07/16 1728  TempSrc:   PainSc: 0-No pain                 Seriyah Collison COKER

## 2016-07-07 NOTE — Op Note (Signed)
   Date of Surgery: 07/07/2016  INDICATIONS: Ms. Witters is a 76 y.o.-year-old female with a right inferior pole patella fracture;  The patient did consent to the procedure after discussion of the risks and benefits.  PREOPERATIVE DIAGNOSIS: Right inferior pole comminuted patella fracture  POSTOPERATIVE DIAGNOSIS: Same.  PROCEDURE: Open treatment of right patella fracture with partial patellectomy and advancement of patellar tendon  SURGEON: N. Eduard Roux, M.D.  ASSIST: Ky Barban, RNFA.  ANESTHESIA:  general  IV FLUIDS AND URINE: See anesthesia.  ESTIMATED BLOOD LOSS: Minimal mL.  IMPLANTS: None  DRAINS: None  COMPLICATIONS: None.  DESCRIPTION OF PROCEDURE: The patient was brought to the operating room and placed supine on the operating table.  The patient had been signed prior to the procedure and this was documented. The patient had the anesthesia placed by the anesthesiologist.  A time-out was performed to confirm that this was the correct patient, site, side and location. The patient did receive antibiotics prior to the incision and was re-dosed during the procedure as needed at indicated intervals.  A tourniquet was placed.  The patient had the operative extremity prepped and draped in the standard surgical fashion.    A midline incision over the knee was used. Blunt dissection was carried down to the peritenon. Peritenon was elevated off of the patellar tendon and the patella. The fracture was exposed. Organized hematoma was removed from the fracture site. The knee joint was then thoroughly lavaged. We then evaluated the inferior pole fracture which was diminutive and comminuted and not amenable to fixation with screws. Therefore we made the decision to perform a partial patellectomy and advancement of the patellar tendon. I then used 2 #5 FiberWire sutures in order to suture the patellar tendon in a Krakw fashion. 3 parallel drill holes were then made through the patella. The  sutures were then passed through the drill holes using a suture passer. With the knee in full extension the sutures were tied down in order to approximate the edge of the patellar tendon to the patella. The wound was then thoroughly irrigated. Small retinacular tear was performed using #1 Vicryl. Subcutaneous layer was closed with 0 Vicryl and 2-0 Vicryl. The skin was closed with 3-0 nylon. Sterile dressings were applied. Patient was placed back into the Bledsoe brace. She tolerated the procedure well and had no immediate competitions.  POSTOPERATIVE PLAN: She will be weight-bear as tolerated in the Bledsoe brace. We will admit her for mobilization with physical therapy. She may need to go to a skilled nursing facility.  Azucena Cecil, MD [place] 3:23 PM

## 2016-07-07 NOTE — Progress Notes (Addendum)
OR desk called  And informed pt will need  Cardiology consult before surgery.   Dr Orene Desanctis request we call cardiology to assess the patient.Call placed to on call (card Master).

## 2016-07-07 NOTE — Anesthesia Postprocedure Evaluation (Signed)
Anesthesia Post Note  Patient: Kayla Griffin  Procedure(s) Performed: Procedure(s) (LRB): Partial Patellectomy (Right)  Patient location during evaluation: PACU Anesthesia Type: General and Regional Level of consciousness: awake, awake and alert and oriented Pain management: pain level controlled Vital Signs Assessment: post-procedure vital signs reviewed and stable Respiratory status: spontaneous breathing, nonlabored ventilation and respiratory function stable Cardiovascular status: blood pressure returned to baseline Anesthetic complications: no       Last Vitals:  Vitals:   07/07/16 1719 07/07/16 1728  BP: (!) 162/70 (!) 160/55  Pulse: 78 83  Resp: (!) 22 18  Temp:  36.3 C    Last Pain:  Vitals:   07/07/16 1728  TempSrc:   PainSc: 0-No pain                 Aylene Acoff COKER

## 2016-07-07 NOTE — Anesthesia Preprocedure Evaluation (Addendum)
Anesthesia Evaluation  Patient identified by MRN, date of birth, ID band Patient awake    Reviewed: Allergy & Precautions, NPO status , Patient's Chart, lab work & pertinent test results  Airway Mallampati: II  TM Distance: >3 FB Neck ROM: Full    Dental  (+) Edentulous Upper, Edentulous Lower   Pulmonary    breath sounds clear to auscultation       Cardiovascular hypertension,  Rhythm:Regular Rate:Normal  Abnormal EKG shows ?infarct. Nothing to compare. Patient denies any cardia history but is diabetic   Neuro/Psych    GI/Hepatic   Endo/Other  diabetes  Renal/GU      Musculoskeletal  (+) Arthritis ,   Abdominal   Peds  Hematology   Anesthesia Other Findings   Reproductive/Obstetrics                           Anesthesia Physical Anesthesia Plan  ASA: III  Anesthesia Plan: General   Post-op Pain Management: GA combined w/ Regional for post-op pain   Induction: Intravenous  Airway Management Planned: Oral ETT  Additional Equipment:   Intra-op Plan: Utilization Of Total Body Hypothermia per surgeon request  Post-operative Plan: Extubation in OR  Informed Consent: I have reviewed the patients History and Physical, chart, labs and discussed the procedure including the risks, benefits and alternatives for the proposed anesthesia with the patient or authorized representative who has indicated his/her understanding and acceptance.   Dental advisory given  Plan Discussed with: CRNA  Anesthesia Plan Comments: (Will need Cardiology clearance prior to surgery)      Anesthesia Quick Evaluation

## 2016-07-07 NOTE — Anesthesia Procedure Notes (Addendum)
Anesthesia Regional Block: Adductor canal block   Pre-Anesthetic Checklist: ,, timeout performed, Correct Patient, Correct Site, Correct Laterality, Correct Procedure, Correct Position, site marked, Risks and benefits discussed,  Surgical consent,  Pre-op evaluation,  At surgeon's request and post-op pain management  Laterality: Right  Prep: chloraprep       Needles:  Injection technique: Single-shot  Needle Type: Echogenic Stimulator Needle      Needle Gauge: 22     Additional Needles:   Procedures: ultrasound guided,,,,,,,,  Narrative:  Start time: 07/07/2016 4:50 PM End time: 07/07/2016 4:55 PM Injection made incrementally with aspirations every 5 mL.  Performed by: Personally   Additional Notes: 30 cc 0.5% Bupivacaine with 1:200 epi injected easily

## 2016-07-07 NOTE — Anesthesia Procedure Notes (Signed)
Procedure Name: Intubation Date/Time: 07/07/2016 2:03 PM Performed by: Ollen Bowl Pre-anesthesia Checklist: Patient identified, Emergency Drugs available, Suction available, Patient being monitored and Timeout performed Patient Re-evaluated:Patient Re-evaluated prior to inductionOxygen Delivery Method: Circle system utilized and Simple face mask Preoxygenation: Pre-oxygenation with 100% oxygen Intubation Type: IV induction Ventilation: Mask ventilation without difficulty Laryngoscope Size: Miller and 2 Grade View: Grade I Tube type: Oral Tube size: 7.5 mm Number of attempts: 1 Airway Equipment and Method: Patient positioned with wedge pillow and Stylet Placement Confirmation: ETT inserted through vocal cords under direct vision,  positive ETCO2 and breath sounds checked- equal and bilateral Secured at: 21 cm Tube secured with: Tape Dental Injury: Teeth and Oropharynx as per pre-operative assessment

## 2016-07-07 NOTE — Discharge Instructions (Signed)
Postoperative instructions:  Weightbearing instructions: as tolerated in bledsoe brace  Keep your dressing and/or splint clean and dry at all times.  You can remove your dressing on post-operative day #3 and change with a dry/sterile dressing or Band-Aids as needed thereafter.    Incision instructions:  Do not soak your incision for 3 weeks after surgery.  If the incision gets wet, pat dry and do not scrub the incision.  Pain control:  You have been given a prescription to be taken as directed for post-operative pain control.  In addition, elevate the operative extremity above the heart at all times to prevent swelling and throbbing pain.  Take over-the-counter Colace, 100mg  by mouth twice a day while taking narcotic pain medications to help prevent constipation.  Follow up appointments: 1) 10-14 days for suture removal and wound check. 2) Dr. Erlinda Hong as scheduled.   -------------------------------------------------------------------------------------------------------------  After Surgery Pain Control:  After your surgery, post-surgical discomfort or pain is likely. This discomfort can last several days to a few weeks. At certain times of the day your discomfort may be more intense.  Did you receive a nerve block?  A nerve block can provide pain relief for one hour to two days after your surgery. As long as the nerve block is working, you will experience little or no sensation in the area the surgeon operated on.  As the nerve block wears off, you will begin to experience pain or discomfort. It is very important that you begin taking your prescribed pain medication before the nerve block fully wears off. Treating your pain at the first sign of the block wearing off will ensure your pain is better controlled and more tolerable when full-sensation returns. Do not wait until the pain is intolerable, as the medicine will be less effective. It is better to treat pain in advance than to try and catch  up.  General Anesthesia:  If you did not receive a nerve block during your surgery, you will need to start taking your pain medication shortly after your surgery and should continue to do so as prescribed by your surgeon.  Pain Medication:  Most commonly we prescribe Vicodin and Percocet for post-operative pain. Both of these medications contain a combination of acetaminophen (Tylenol) and a narcotic to help control pain.   It takes between 30 and 45 minutes before pain medication starts to work. It is important to take your medication before your pain level gets too intense.   Nausea is a common side effect of many pain medications. You will want to eat something before taking your pain medicine to help prevent nausea.   If you are taking a prescription pain medication that contains acetaminophen, we recommend that you do not take additional over the counter acetaminophen (Tylenol).  Other pain relieving options:   Using a cold pack to ice the affected area a few times a day (15 to 20 minutes at a time) can help to relieve pain, reduce swelling and bruising.   Elevation of the affected area can also help to reduce pain and swelling.     You have an echo scheduled at 2:00pm on 07/21/16 in our [address] office and then on 07/24/16 you have follow up with Dr. Jerilynn Mages. Croitoru's PA at 2:00PM at our [facility] office above  [employer] across from [place].  Call [phone] if any questions

## 2016-07-07 NOTE — Consult Note (Signed)
Reason for Consult: abnormal EKG   Referring Physician:  Dr. Orene Desanctis and Dr. Erlinda Hong  PCP:  Aretta Nip, MD  Primary Cardiologist:New--Dr. Jerilynn Mages. Channing Yeager   Kayla Griffin is an 75 y.o. female.    Chief Complaint: pt here for Rt patella fixation and EKG was found to be abnormal   HPI:  Asked to see 39 yof with HTN, DM and HLD with abnormal EKG for surgical clearance/risk.  Pt has no hx herself of CAD but + in family and she does have risk factors.  She has never had cardiac disease though with high intake of caffeine she had tachycardia in distant past - stopping caffeine resolved the problem.  She has never had chest pain.  She has been in her usual state of health until Monday she tripped on uneven concrete and fell fracturing her patella.  She is here now for surgery.    No hx of syncope, chest pain or SOB until this week with walker she does have SOB pushing that around with her knee in a brace.   EKG with SR Q wave in III and AVF.     Past Medical History:  Diagnosis Date  . Arthritis   . Cancer ([place])    Intermetrial CA  . Depression   . Diabetes mellitus without complication ([place])    Type II  . Dyspnea    due to pain  . Glaucoma   . Hypertension     Past Surgical History:  Procedure Laterality Date  . ABDOMINAL HYSTERECTOMY    . CERVICAL FUSION  2007  . EYE SURGERY Bilateral     Family History  Problem Relation Age of Onset  . CVA Mother   . Heart disease Father 69  . Heart attack Sister   . Heart disease Sister   . Arrhythmia Sister   . CVA Sister    Social History:  reports that she has never smoked. She has never used smokeless tobacco. She reports that she does not drink alcohol or use drugs.  Allergies:  Allergies  Allergen Reactions  . Actos [Pioglitazone] Swelling    SWELLING REACTION UNSPECIFIED     OUTPATIENT MEDICATIONS: No current facility-administered medications on file prior to encounter.    Current Outpatient  Prescriptions on File Prior to Encounter  Medication Sig Dispense Refill  . aspirin EC 81 MG tablet Take 81 mg by mouth daily.    Marland Kitchen atorvastatin (LIPITOR) 40 MG tablet Take 40 mg by mouth daily.     . DORZOLAMIDE HCL OP Place 1 drop into both eyes 2 (two) times daily.    . hydrochlorothiazide (HYDRODIURIL) 25 MG tablet Take 25 mg by mouth daily.     Marland Kitchen HYDROcodone-acetaminophen (NORCO/VICODIN) 5-325 MG tablet Take 1-2 tablets by mouth every 6 (six) hours as needed. 15 tablet 0  . insulin NPH-regular Human (NOVOLIN 70/30) (70-30) 100 UNIT/ML injection Inject 30 Units into the skin 2 (two) times daily with a meal.    . latanoprost (XALATAN) 0.005 % ophthalmic solution Place 1 drop into both eyes at bedtime.     . metFORMIN (GLUCOPHAGE) 500 MG tablet Take 500 mg by mouth 2 (two) times daily with a meal.    . metoprolol (LOPRESSOR) 100 MG tablet Take 100 mg by mouth 2 (two) times daily.    . ramipril (ALTACE) 10 MG capsule Take 20 mg by mouth daily.        Results for orders placed  or performed during the hospital encounter of 07/07/16 (from the past 48 hour(s))  Glucose, capillary     Status: Abnormal   Collection Time: 07/07/16  6:55 AM  Result Value Ref Range   Glucose-Capillary 170 (H) 65 - 99 mg/dL   Comment 1 Notify RN    Comment 2 Document in Chart   Glucose, capillary     Status: Abnormal   Collection Time: 07/07/16  9:20 AM  Result Value Ref Range   Glucose-Capillary 156 (H) 65 - 99 mg/dL   Comment 1 Notify RN    Comment 2 Document in Chart    No results found.  ROS: General:no colds or fevers, no weight changes Skin:no rashes or ulcers HEENT:no blurred vision, no congestion CV:see HPI PUL:see HPI GI:no diarrhea constipation or melena, no indigestion GU:no hematuria, no dysuria MS:no joint pain, no claudication Neuro:no syncope, no lightheadedness Endo:+ diabetes, no thyroid disease   Blood pressure (!) 145/53, pulse 67, temperature 98.8 F (37.1 C), temperature source  Oral, resp. rate 14, height 5\' 5"  (1.651 m), weight 208 lb (94.3 kg), SpO2 97 %.  Wt Readings from Last 3 Encounters:  07/07/16 208 lb (94.3 kg)    PE: General:Pleasant affect, NAD Skin:Warm and dry, brisk capillary refill HEENT:normocephalic, sclera clear, mucus membranes moist Neck:supple, no JVD, no bruits  Heart:S1S2 RRR without murmur, gallup, rub or click Lungs:clear without rales, rhonchi, or wheezes HKV:QQVZ, non tender, + BS, do not palpate liver spleen or masses Ext:no lower ext edema, 2+ pedal pulses, 2+ radial pulses, brace on Rt leg and pain in that leg. Neuro:alert and oriented X 3, MAE, follows commands, + facial symmetry    Assessment/Plan Abnormal EKG - from EKG in 2007 Q waves are new.  But pt is asymptomatic and has been.   She does have risk factors and family hx for CAD.   She should have cardiac eval  But Dr. Sallyanne Kuster has  Seen and evaluated and we believe she is low risk for surgery.  Will schedule outpt follow up in 2 weeks for further eval.     --she has an office appt on April 16th at 2:00PM with Dr. Victorino December PA.    Fx Patella- for surgery  HTN per PCP  HLD per PCP  DM per PCP  Cecilie Kicks  Nurse Practitioner Certified [place] Pager [phone] or after 5pm or weekends call [phone] 07/07/2016, 9:32 AM   I have seen and examined the patient along with Cecilie Kicks, NP.  I have reviewed the chart, notes and new data.  I agree with NP's note.  Key new complaints: excellent functional status without dyspnea or angina. No history of previous events that could be a myocardial infarction.  Key examination changes: normal CV exam Key new findings / data: ECG is compared to a prior ECG from 2007 (in [facility]) and the broad and deep inferior Q waves are new. There is also substantial QRS prolongation, almost meeting criteria for LBBB.  PLAN: Unclear if the ECG changes are post-infarction or related to the intraventricular conduction  abnormality. Either way, there are no signs of acute or recent ischemic changes on the ECG. Her surgical risk is low and the procedure has low CV risk. She has numerous coronary risk factors, but they all appear to be appropriately addressed. She is on a high dose of a highly active statin, aspirin and a high dose of beta blocker (care should be taken that her metoprolol is continued throughout  the entire perioperative period). Would go ahead with the elective surgery today and go ahead with her cardiac workup as an outpatient. Will start with an echocardiogram and then plan additional coronary workup if LVEF is depressed or if there are regional wall motion abnormalities.  Sanda Klein, MD, [place] [phone] 07/07/2016, 9:46 AM

## 2016-07-07 NOTE — Progress Notes (Signed)
Trish from Cardiology called and informed of need for consult states she will be have some one come over as soon as possible, Dr Orene Desanctis informed.

## 2016-07-08 ENCOUNTER — Encounter (HOSPITAL_COMMUNITY): Payer: Self-pay | Admitting: Orthopaedic Surgery

## 2016-07-08 DIAGNOSIS — S82041A Displaced comminuted fracture of right patella, initial encounter for closed fracture: Secondary | ICD-10-CM | POA: Diagnosis not present

## 2016-07-08 LAB — GLUCOSE, CAPILLARY
Glucose-Capillary: 176 mg/dL — ABNORMAL HIGH (ref 65–99)
Glucose-Capillary: 227 mg/dL — ABNORMAL HIGH (ref 65–99)
Glucose-Capillary: 184 mg/dL — ABNORMAL HIGH (ref 65–99)
Glucose-Capillary: 155 mg/dL — ABNORMAL HIGH (ref 65–99)

## 2016-07-08 LAB — HEMOGLOBIN A1C
Hgb A1c MFr Bld: 6.4 % — ABNORMAL HIGH (ref 4.8–5.6)
Mean Plasma Glucose: 137 mg/dL

## 2016-07-08 NOTE — Care Management Obs Status (Signed)
[facility] NOTIFICATION   Patient Details  Name: Kayla Griffin MRN: [id number] Date of Birth: 1940-09-15   Medicare Observation Status Notification Given:  Yes    CrutchfieldAntony Haste, RN 07/08/2016, 4:45 PM

## 2016-07-08 NOTE — Evaluation (Signed)
Physical Therapy Evaluation Patient Details Name: Kayla Griffin MRN: [id number] DOB: July 05, 1940 Today's Date: 07/08/2016   History of Present Illness  Pt is a 76 y/o female admitted for R patellar fx s/p partial patellectomy. PMH including but not limited to DM, HTN and glaucoma  Clinical Impression  Pt presented supine in bed with HOB elevated, awake and willing to participate in therapy session. Prior to admission, pt reported that she ambulated with use of SPC and was independent with ADLs. Pt currently required min A for bed mobility and min to mod A for transfers with RW. Pt very unsteady in standing and with transfers. Pt reported that she lives alone and initially stated that she has no one that could assist her upon d/c. Pt later stated that her neighbor is available PRN. PT recommending pt d/c to SNF for ST rehab prior to returning home for further intensive therapy services. Pt adamant about returning home. Pt would continue to benefit from skilled physical therapy services at this time while admitted and after d/c to address the below listed limitations in order to improve overall safety and independence with functional mobility.     Follow Up Recommendations SNF;Supervision/Assistance - 24 hour (if pt refuses, she will need 24/7 physical assistance, HHPT, HHOT and [place] aide)    Equipment Recommendations  None recommended by PT    Recommendations for Other Services       Precautions / Restrictions Precautions Precautions: Fall Required Braces or Orthoses: Other Brace/Splint Other Brace/Splint: Bledsoe (R) Restrictions Weight Bearing Restrictions: Yes RLE Weight Bearing: Weight bearing as tolerated      Mobility  Bed Mobility Overal bed mobility: Needs Assistance Bed Mobility: Supine to Sit     Supine to sit: Min assist     General bed mobility comments: increased time, use of bed rails, min A for movement of R LE off of bed  Transfers Overall transfer level:  Needs assistance Equipment used: Rolling walker (2 wheeled) Transfers: Sit to/from [facility] Sit to Stand: Mod assist Stand pivot transfers: Min assist       General transfer comment: increased time, VC'ing for bilateral hand placement, mod A to rise from bed x1, BSC x1 and recliner chair x1  Ambulation/Gait                Stairs            Wheelchair Mobility    Modified Rankin (Stroke Patients Only)       Balance Overall balance assessment: Needs assistance Sitting-balance support: Feet supported Sitting balance-Leahy Scale: Fair     Standing balance support: During functional activity;Bilateral upper extremity supported Standing balance-Leahy Scale: Poor Standing balance comment: pt reliant on bilateral UEs on RW; total A with peri care                             Pertinent Vitals/Pain Pain Assessment: Faces Faces Pain Scale: Hurts little more Pain Location: R knee Pain Descriptors / Indicators: Sore Pain Intervention(s): Monitored during session;Repositioned    Home Living Family/patient expects to be discharged to:: Private residence Living Arrangements: Alone Available Help at Discharge: Friend(s);Family;Available PRN/intermittently Type of Home: House Home Access: Stairs to enter Entrance Stairs-Rails: None Entrance Stairs-Number of Steps: 1 Home Layout: Two level;1/2 bath on main level;Bed/bath upstairs Home Equipment: Walker - 2 wheels;Bedside commode;Cane - single point      Prior Function Level of Independence: Independent with assistive device(s)  Comments: pt reported that she previously ambulated with [facility]     Hand Dominance        Extremity/Trunk Assessment   Upper Extremity Assessment Upper Extremity Assessment: Defer to OT evaluation    Lower Extremity Assessment Lower Extremity Assessment: RLE deficits/detail RLE Deficits / Details: pt with decreased strength and ROM limitations  secondary to post-op. Pt with slightly diminshed sensation on plantar surface of foot.  RLE: Unable to fully assess due to pain;Unable to fully assess due to immobilization    Cervical / Trunk Assessment Cervical / Trunk Assessment: Kyphotic  Communication   Communication: No difficulties  Cognition Arousal/Alertness: Awake/alert Behavior During Therapy: WFL for tasks assessed/performed Overall Cognitive Status: Within Functional Limits for tasks assessed                                        General Comments      Exercises     Assessment/Plan    PT Assessment Patient needs continued PT services  PT Problem List Decreased strength;Decreased range of motion;Decreased activity tolerance;Decreased balance;Decreased mobility;Decreased coordination;Decreased knowledge of use of DME;Decreased safety awareness;Decreased knowledge of precautions;Pain       PT Treatment Interventions DME instruction;Gait training;Stair training;Functional mobility training;Therapeutic activities;Therapeutic exercise;Balance training;Neuromuscular re-education;Patient/family education    PT Goals (Current goals can be found in the Care Plan section)  Acute Rehab PT Goals Patient Stated Goal: return home PT Goal Formulation: With patient Time For Goal Achievement: 07/22/16 Potential to Achieve Goals: Good    Frequency Min 3X/week   Barriers to discharge Decreased caregiver support      Co-evaluation               End of Session Equipment Utilized During Treatment: Gait belt Activity Tolerance: Patient limited by pain;Patient limited by fatigue Patient left: in chair;with call bell/phone within reach Nurse Communication: Mobility status PT Visit Diagnosis: Other abnormalities of gait and mobility (R26.89);Pain Pain - Right/Left: Right Pain - part of body: Knee    Time: 3500-9381 PT Time Calculation (min) (ACUTE ONLY): 25 min   Charges:   PT Evaluation $PT Eval  Moderate Complexity: 1 Procedure PT Treatments $Therapeutic Activity: 8-22 mins   PT G Codes:   PT G-Codes **NOT FOR INPATIENT CLASS** Functional Assessment Tool Used: AM-PAC 6 Clicks Basic Mobility;Clinical judgement Functional Limitation: Mobility: Walking and moving around Mobility: Walking and Moving Around Current Status (W2993): At least 40 percent but less than 60 percent impaired, limited or restricted Mobility: Walking and Moving Around Goal Status [phone]): At least 20 percent but less than 40 percent impaired, limited or restricted    [facility], [place], DPT [place] 07/08/2016, 11:16 AM

## 2016-07-08 NOTE — Care Management Note (Signed)
75 yo F fell at home and sustained a R patellar fx. s/p partial patellectomy.  Received referral to assist with [facility] and DME.  PT is recommending SNF.  Met with pt at bedside. She lives alone. She has a sister and a brother-in-law but they are not able to assist her 24 hrs. She has a neighbor that gets the mail for her. Discussed PT recommendations and the importance of a safe d/c plan. She refuses to go to SNF. She reports that her sister has been in different nursing homes and she did not like them. She plans to return home and stay downstairs where she has 1/2 a bathroom and the kitchen. Discussed HH PT and an aide. Explained to pt that the [facility] aide won't provide care every day and they only provide assistance with a bath and getting her OOB. She is adamant about SNF. Provided pt with a list of [place] agencies. She stated that her sister used a [facility] agency in the past. She contacted her sister to discuss the agency but her sister is not at home.   Discussed D/C plan with Budd Palmer, RN and Peyton Bottoms.   Will continue to f/u to assist with the D/C plan.

## 2016-07-08 NOTE — Progress Notes (Signed)
Will dc patient per PT recs to snf or 24/7 assistance

## 2016-07-08 NOTE — Progress Notes (Signed)
Subjective: Patient doing reasonably well. jective: Vital signs in last 24 hours: Temp:  [97.3 F (36.3 C)-98.6 F (37 C)] 98.4 F (36.9 C) (03/31 7579) Pulse Rate:  [62-85] 64 (03/31 0638) Resp:  [16-22] 18 (03/30 1950) BP: (127-162)/(50-104) 143/55 (03/31 0638) SpO2:  [95 %-100 %] 95 % (03/31 7282)  Intake/Output from previous day: 03/30 0701 - 03/31 0700 In: 2475 [I.V.:2375; IV Piggyback:100] Out: 350 [Urine:300; Blood:50] Intake/Output this shift: No intake/output data recorded.  Exam:  Dorsiflexion/Plantar flexion intact  Labs: No results for input(s): HGB in the last 72 hours. No results for input(s): WBC, RBC, HCT, PLT in the last 72 hours. No results for input(s): NA, K, CL, CO2, BUN, CREATININE, GLUCOSE, CALCIUM in the last 72 hours. No results for input(s): LABPT, INR in the last 72 hours.  Assessment/Plan: Plan at this time is to mobilize with physical therapy with the leg in a knee immobilizer.  Do not want any flexion of the knee at this time.  We'll likely need skilled nursing home placement.   G Scott Dean 07/08/2016, 11:06 AM

## 2016-07-08 NOTE — Progress Notes (Addendum)
Contacted Dr. Marlou Sa and discussed OBS status and d/c plan. Pt is not safe to return home alone today. Met with pt and explained OBS status and the ABN form. Pt signed the ABN form.  Pt reports that her sister used [facility] for [facility]. Contacted Jamaine at [facility] for [facility] PT, OT, and an aide referral.  Pt has a RW and a 3-in-BSC.

## 2016-07-08 NOTE — Evaluation (Signed)
Occupational Therapy Evaluation Patient Details Name: Kayla Griffin MRN: [id number] DOB: 1940-04-12 Today's Date: 07/08/2016    History of Present Illness Pt is a 76 y/o female admitted for R patellar fx s/p partial patellectomy. PMH including but not limited to DM, HTN and glaucoma   Clinical Impression   PTA, pt was independent with ADL and functional mobility and was living alone. Pt currently requires min assist for LB ADL and stand-pivot toilet transfers and demonstrates significantly decreased safety awareness during ADL tasks. Pt demonstrates significant decline from PLOF and would benefit from short-term SNF placement in order to maximize independence with ADL. However, pt adamantly declining SNF at this time and thus will need maximum home health services. OT will continue to follow acutely to improve independence with ADL.    Follow Up Recommendations  SNF;Supervision/Assistance - 24 hour (if refuses, needs max HH services)    Equipment Recommendations  Tub/shower bench    Recommendations for Other Services       Precautions / Restrictions Precautions Precautions: Fall Required Braces or Orthoses: Other Brace/Splint Other Brace/Splint: Bledsoe (R) Restrictions Weight Bearing Restrictions: Yes RLE Weight Bearing: Weight bearing as tolerated      Mobility Bed Mobility Overal bed mobility: Needs Assistance Bed Mobility: Supine to Sit     Supine to sit: Min guard     General bed mobility comments: Min guard for safety.   Transfers Overall transfer level: Needs assistance Equipment used: Rolling walker (2 wheeled) Transfers: Sit to/from Stand Sit to Stand: Min assist Stand pivot transfers: Min assist       General transfer comment: Min steadying assist.    Balance Overall balance assessment: Needs assistance Sitting-balance support: Feet supported Sitting balance-Leahy Scale: Fair     Standing balance support: During functional activity;Bilateral  upper extremity supported Standing balance-Leahy Scale: Poor Standing balance comment: Pt reliant on B UE on RW in standing.                           ADL either performed or assessed with clinical judgement   ADL Overall ADL's : Needs assistance/impaired Eating/Feeding: Set up;Sitting   Grooming: Set up;Sitting   Upper Body Bathing: Min guard;Sitting   Lower Body Bathing: Minimal assistance;Sit to/from stand   Upper Body Dressing : Min guard;Sitting   Lower Body Dressing: Minimal assistance;Sit to/from stand   Toilet Transfer: Minimal assistance;RW;BSC;Stand-pivot   Toileting- Water quality scientist and Hygiene: Sit to/from stand;Minimal assistance         General ADL Comments: Pt adamant that she will be returning home rather than SNF. Educated pt on safety concerns and she reports understanding but continues to refuse.      Vision Patient Visual Report: No change from baseline Vision Assessment?: No apparent visual deficits     Perception     Praxis      Pertinent Vitals/Pain Pain Assessment: Faces Faces Pain Scale: Hurts even more Pain Location: R knee Pain Descriptors / Indicators: Sore Pain Intervention(s): Limited activity within patient's tolerance;Monitored during session;Repositioned     Hand Dominance Right   Extremity/Trunk Assessment Upper Extremity Assessment Upper Extremity Assessment: Generalized weakness   Lower Extremity Assessment Lower Extremity Assessment: Defer to PT evaluation   Cervical / Trunk Assessment Cervical / Trunk Assessment: Kyphotic   Communication Communication Communication: No difficulties   Cognition Arousal/Alertness: Awake/alert Behavior During Therapy: WFL for tasks assessed/performed Overall Cognitive Status: Within Functional Limits for tasks assessed  General Comments       Exercises     Shoulder Instructions      Home Living Family/patient  expects to be discharged to:: Private residence Living Arrangements: Alone Available Help at Discharge: Friend(s);Family;Available PRN/intermittently Type of Home: House Home Access: Stairs to enter [employer] of Steps: 1 Entrance Stairs-Rails: None Home Layout: Two level;1/2 bath on main level;Bed/bath upstairs Alternate Level Stairs-Number of Steps: flight   Bathroom Shower/Tub: Tub/shower unit (only bathing option is upstairs)         Home Equipment: Environmental consultant - 2 wheels;Bedside commode;Cane - single point          Prior Functioning/Environment Level of Independence: Independent with assistive device(s)        Comments: pt reported that she previously ambulated with [facility]        OT Problem List: Decreased strength;Decreased range of motion;Decreased activity tolerance;Impaired balance (sitting and/or standing);Decreased safety awareness;Decreased knowledge of use of DME or AE;Decreased knowledge of precautions;Pain      OT Treatment/Interventions: Self-care/ADL training;Therapeutic exercise;Energy conservation;DME and/or AE instruction;Therapeutic activities;Patient/family education;Balance training    OT Goals(Current goals can be found in the care plan section) Acute Rehab OT Goals Patient Stated Goal: return home OT Goal Formulation: With patient Time For Goal Achievement: 07/22/16 Potential to Achieve Goals: Good ADL Goals Pt Will Perform Lower Body Bathing: with modified independence;sit to/from stand Pt Will Perform Lower Body Dressing: with modified independence;sit to/from stand Pt Will Transfer to Toilet: with modified independence;ambulating;bedside commode Pt Will Perform Toileting - Clothing Manipulation and hygiene: with modified independence;sit to/from stand Pt Will Perform Tub/Shower Transfer: with modified independence;Tub transfer;3 in 1;rolling walker;ambulating Additional ADL Goal #1: Pt will identify 3 strategies to prevent fall prevention  at home in order to improve safety with ADL.  OT Frequency: Min 2X/week   Barriers to D/C:            Co-evaluation              End of Session Equipment Utilized During Treatment: Gait belt;Rolling walker Nurse Communication: Mobility status  Activity Tolerance: Patient tolerated treatment well Patient left: in chair;with call bell/phone within reach  OT Visit Diagnosis: Other abnormalities of gait and mobility (R26.89);Pain Pain - Right/Left: Right Pain - part of body: Knee                Time: 2482-5003 OT Time Calculation (min): 19 min Charges:  OT General Charges $OT Visit: 1 Procedure OT Evaluation $OT Eval Moderate Complexity: 1 Procedure G-Codes: OT G-codes **NOT FOR INPATIENT CLASS** Functional Assessment Tool Used: AM-PAC 6 Clicks Daily Activity Functional Limitation: Self care Self Care Current Status (B0488): At least 40 percent but less than 60 percent impaired, limited or restricted Self Care Goal Status (Q9169): At least 1 percent but less than 20 percent impaired, limited or restricted   Norman Herrlich, [place] OTR/L  Pager: [place] 07/08/2016, 4:17 PM

## 2016-07-09 DIAGNOSIS — S82041A Displaced comminuted fracture of right patella, initial encounter for closed fracture: Secondary | ICD-10-CM | POA: Diagnosis not present

## 2016-07-09 LAB — GLUCOSE, CAPILLARY
Glucose-Capillary: 165 mg/dL — ABNORMAL HIGH (ref 65–99)
Glucose-Capillary: 203 mg/dL — ABNORMAL HIGH (ref 65–99)
Glucose-Capillary: 189 mg/dL — ABNORMAL HIGH (ref 65–99)

## 2016-07-09 NOTE — Progress Notes (Signed)
Patient making better progress today with occupational and physical therapy Anticipate being able to discharge to home tomorrow. Continue physical therapy for now.

## 2016-07-09 NOTE — Progress Notes (Signed)
Occupational Therapy Treatment Patient Details Name: Kayla Griffin MRN: [id number] DOB: [birth year] Today's Date: 07/09/2016    History of present illness Pt is a 76 y/o female admitted for R patellar fx s/p partial patellectomy. PMH including but not limited to DM, HTN and glaucoma   OT comments  Pt demonstrating good progress toward OT goals. She was able to ambulate into bathroom for toileting and standing grooming tasks with fluctuating assist from min guard to min assist. She demonstrates decreased activity tolerance for ADL requiring therapeutic rest breaks following approximately 3 minutes of activity. Pt continues to decline SNF placement and will need maximum home health services post-acute D/C. Educated pt on energy conservation and safety strategies post-acute D/C. Continue to feel that pt demonstrates decreased understanding of her need for assistance but she was able to identify when she required rest which is an improvement from OT evaluation. Will continue to follow acutely.    Follow Up Recommendations  SNF;Supervision/Assistance - 24 hour (Maximum HH services if refuses)    Equipment Recommendations  Tub/shower bench    Recommendations for Other Services      Precautions / Restrictions Precautions Precautions: Fall Required Braces or Orthoses: Other Brace/Splint Other Brace/Splint: Bledsoe (R) Restrictions Weight Bearing Restrictions: Yes RLE Weight Bearing: Weight bearing as tolerated       Mobility Bed Mobility Overal bed mobility: Needs Assistance Bed Mobility: Supine to Sit     Supine to sit: Min guard     General bed mobility comments: Min guard for safety, increased time, and heavy use of bed rails.  Transfers Overall transfer level: Needs assistance Equipment used: Rolling walker (2 wheeled) Transfers: Sit to/from Stand Sit to Stand: Min guard Stand pivot transfers: Min guard       General transfer comment: Min guard for safety. Pt required  increased time.    Balance Overall balance assessment: Needs assistance Sitting-balance support: Feet supported Sitting balance-Leahy Scale: Fair     Standing balance support: During functional activity;Bilateral upper extremity supported Standing balance-Leahy Scale: Fair Standing balance comment: Able to statically stand without UE support.                           ADL either performed or assessed with clinical judgement   ADL Overall ADL's : Needs assistance/impaired     Grooming: Min guard;Standing;Brushing hair                   Toilet Transfer: Min guard;RW;Ambulation;Minimal Patent examiner Details (indicate cue type and reason): Pt able to ambulate to bathroom with fluctuating assist from min guard to min assist.         Functional mobility during ADLs: Min guard;Minimal assistance;Rolling walker General ADL Comments: Pt able to tolerate ambulation into bathroom and approximately 3 minutes of standing activity at the sink incorporating dynamic reaching tasks to challenge standing balance. Pt with significantly decreased activity tolerance for ADL and required standing rest break for approximately 30 seconds. Educated pt concerning safety post-acute D/C. She remains insistent that she will be safe at home although she does acknowledge her need for assistance while admitted.     Vision   Vision Assessment?: No apparent visual deficits   Perception     Praxis      Cognition Arousal/Alertness: Awake/alert Behavior During Therapy: WFL for tasks assessed/performed Overall Cognitive Status: Within Functional Limits for tasks assessed  Exercises     Shoulder Instructions       General Comments      Pertinent Vitals/ Pain       Pain Assessment: Faces Faces Pain Scale: Hurts little more Pain Location: R knee Pain Descriptors / Indicators: Sore Pain Intervention(s):  Monitored during session;Repositioned  Home Living                                          Prior Functioning/Environment              Frequency  Min 2X/week        Progress Toward Goals  OT Goals(current goals can now be found in the care plan section)     Acute Rehab OT Goals Patient Stated Goal: return home OT Goal Formulation: With patient Time For Goal Achievement: 07/22/16 Potential to Achieve Goals: Good ADL Goals Pt Will Perform Lower Body Bathing: with modified independence;sit to/from stand Pt Will Perform Lower Body Dressing: with modified independence;sit to/from stand Pt Will Transfer to Toilet: with modified independence;ambulating;bedside commode Pt Will Perform Toileting - Clothing Manipulation and hygiene: with modified independence;sit to/from stand Pt Will Perform Tub/Shower Transfer: with modified independence;Tub transfer;3 in 1;rolling walker;ambulating Additional ADL Goal #1: Pt will identify 3 strategies to prevent fall prevention at home in order to improve safety with ADL.  Plan Discharge plan remains appropriate    Co-evaluation                 End of Session Equipment Utilized During Treatment: Gait belt;Rolling walker  OT Visit Diagnosis: Other abnormalities of gait and mobility (R26.89);Pain Pain - Right/Left: Right Pain - part of body: Knee   Activity Tolerance Patient tolerated treatment well   Patient Left in chair;with call bell/phone within reach   Nurse Communication Mobility status    Functional Assessment Tool Used: AM-PAC 6 Clicks Daily Activity Functional Limitation: Self care Self Care Current Status (J8563): At least 40 percent but less than 60 percent impaired, limited or restricted Self Care Goal Status (J4970): At least 1 percent but less than 20 percent impaired, limited or restricted   Time: 0950-1010 OT Time Calculation (min): 20 min  Charges: OT G-codes **NOT FOR INPATIENT  CLASS** Functional Assessment Tool Used: AM-PAC 6 Clicks Daily Activity Functional Limitation: Self care Self Care Current Status (Y6378): At least 40 percent but less than 60 percent impaired, limited or restricted Self Care Goal Status (H8850): At least 1 percent but less than 20 percent impaired, limited or restricted OT General Charges $OT Visit: 1 Procedure OT Treatments $Self Care/Home Management : 8-22 mins  Norman Herrlich, MS OTR/L  Pager: [place] 07/09/2016, 11:07 AM

## 2016-07-10 DIAGNOSIS — S82041A Displaced comminuted fracture of right patella, initial encounter for closed fracture: Secondary | ICD-10-CM | POA: Diagnosis not present

## 2016-07-10 LAB — GLUCOSE, CAPILLARY
Glucose-Capillary: 163 mg/dL — ABNORMAL HIGH (ref 65–99)
Glucose-Capillary: 163 mg/dL — ABNORMAL HIGH (ref 65–99)
Glucose-Capillary: 216 mg/dL — ABNORMAL HIGH (ref 65–99)

## 2016-07-10 NOTE — Progress Notes (Signed)
Subjective: Patient stable.  She is walking in the hall with her Bledsoe brace and position   Objective: Vital signs in last 24 hours: Temp:  [98.4 F (36.9 C)-98.6 F (37 C)] 98.4 F (36.9 C) (04/02 0500) Pulse Rate:  [62-71] 62 (04/02 0500) BP: (140-146)/(50-51) 140/50 (04/02 0500) SpO2:  [95 %-96 %] 96 % (04/02 0500)  Intake/Output from previous day: 04/01 0701 - 04/02 0700 In: 240 [P.O.:240] Out: -  Intake/Output this shift: Total I/O In: 480 [P.O.:480] Out: -   Exam:  Dorsiflexion/Plantar flexion intact  Labs: No results for input(s): HGB in the last 72 hours. No results for input(s): WBC, RBC, HCT, PLT in the last 72 hours. No results for input(s): NA, K, CL, CO2, BUN, CREATININE, GLUCOSE, CALCIUM in the last 72 hours. No results for input(s): LABPT, INR in the last 72 hours.  Assessment/Plan: Plan is for discharge today with maximum home health services.  She will follow up with Dr. Sherrian Divers next week   Anderson [place] 07/10/2016, 12:07 PM

## 2016-07-10 NOTE — Progress Notes (Signed)
Pt is ready for discharge from unit to home. Family to provided transportation. All personal belongings with pt. Discharge instructions and RX reviewed with pt. Pt demonstrates no s/sx of distress. Follow up appts reviewed with pt

## 2016-07-10 NOTE — Clinical Social Work Note (Signed)
CSW spoke with pt at bedside. At this time pt is refusing SNF placement at d/c. Pt is set up with [facility]. RNCM notified. Clinical Social Worker will sign off for now as social work intervention is no longer needed. Please consult [place] again if new need arises.  [address], [place]

## 2016-07-10 NOTE — Progress Notes (Signed)
Occupational Therapy Treatment Patient Details Name: BERNETA SCONYERS MRN: [id number] DOB: 04-02-1941 Today's Date: 07/10/2016    History of present illness Pt is a 76 y/o female admitted for R patellar fx s/p partial patellectomy. PMH including but not limited to DM, HTN and glaucoma   OT comments  Pt is demonstrating steady progress towards OT goals, currently overall supervision level.  Pt was able to ambulate around room and into bathroom with RW at overall supervision level, requiring min cues for safety due to quick speed with mobility.  Pt completed hand hygiene in bathroom and requested to return to sitting without completing any other grooming tasks in standing.  Was able to doff/don socks with increased time, declining use of AE.  Pt able to verbalize 3 fall prevention strategies with use of energy conservation, slower paced movements, and use of bag on RW to increase safety when transporting items.  Pt would benefit from 24/7 supervision initially, however pt reports she will not have 24 hour assist but has family/neighbors who can "check on her".     Follow Up Recommendations  Supervision/Assistance - 24 hour (Maximum HH services as she will only have intermittent supervsion)    Equipment Recommendations  Tub/shower bench    Recommendations for Other Services      Precautions / Restrictions Precautions Precautions: Fall Required Braces or Orthoses: Other Brace/Splint Other Brace/Splint: Bledsoe (R) Restrictions Weight Bearing Restrictions: Yes RLE Weight Bearing: Weight bearing as tolerated       Mobility Bed Mobility Overal bed mobility: Modified Independent Bed Mobility: Supine to Sit     Supine to sit: Modified independent (Device/Increase time)        Transfers Overall transfer level: Needs assistance Equipment used: Rolling walker (2 wheeled) Transfers: Sit to/from Stand Sit to Stand: Supervision Stand pivot transfers: Supervision       General  transfer comment: Pt with improved mobility from weekend, with supervision for mobility due to quick movements.  Able to verbalize strategies to increase safety.        ADL either performed or assessed with clinical judgement   ADL       Grooming: Supervision/safety;Standing;Wash/dry hands               Lower Body Dressing: Sit to/from stand;Supervision/safety   Toilet Transfer: Supervision/safety;Ambulation;RW;BSC Toilet Transfer Details (indicate cue type and reason): Pt ambulated to bathroom with overall supervision with use of RW, somewhat impulsive movements however overall safe with mobility         Functional mobility during ADLs: Supervision/safety;Rolling walker General ADL Comments: Pt ambulated into bathroom and around room with RW at overall supervision level.  Maintained standing to complete hand hygiene with overall supervision.  Pt requested to sit after hand hygiene.  Doffed/donned socks in sitting with rest break between each foot.  Discussed energy conservation strategies and modifications to increase safety with mobility.  Pt continues to insist on d/c home and reports she will have intermittent assist with family and neighbors checking in on her.     Vision Patient Visual Report: No change from baseline Vision Assessment?: No apparent visual deficits          Cognition Arousal/Alertness: Awake/alert Behavior During Therapy: WFL for tasks assessed/performed Overall Cognitive Status: Within Functional Limits for tasks assessed  Pertinent Vitals/ Pain       Pain Assessment: No/denies pain         Frequency  Min 2X/week        Progress Toward Goals  OT Goals(current goals can now be found in the care plan section)  Progress towards OT goals: Progressing toward goals  Acute Rehab OT Goals Patient Stated Goal: return home OT Goal Formulation: With patient Time For Goal  Achievement: 07/22/16 Potential to Achieve Goals: Good  Plan Discharge plan remains appropriate       End of Session Equipment Utilized During Treatment: Gait belt;Rolling walker  OT Visit Diagnosis: Other abnormalities of gait and mobility (R26.89)   Activity Tolerance Patient tolerated treatment well   Patient Left in chair;with call bell/phone within reach   Nurse Communication Mobility status    Functional Assessment Tool Used: AM-PAC 6 Clicks Daily Activity Functional Limitation: Self care Self Care Current Status (E9611): At least 20 percent but less than 40 percent impaired, limited or restricted Self Care Goal Status (E4353): At least 1 percent but less than 20 percent impaired, limited or restricted   Time: 9122-5834 OT Time Calculation (min): 18 min  Charges: OT G-codes **NOT FOR INPATIENT CLASS** Functional Assessment Tool Used: AM-PAC 6 Clicks Daily Activity Functional Limitation: Self care Self Care Current Status (M2194): At least 20 percent but less than 40 percent impaired, limited or restricted Self Care Goal Status (F1252): At least 1 percent but less than 20 percent impaired, limited or restricted OT General Charges $OT Visit: 1 Procedure OT Treatments $Self Care/Home Management : 8-22 mins  Simonne Come, [phone] 07/10/2016, 11:38 AM

## 2016-07-10 NOTE — Progress Notes (Signed)
qPhysical Therapy Treatment Patient Details Name: Kayla Griffin MRN: [id number] DOB: 1941/03/29 Today's Date: 07/10/2016    History of Present Illness Pt is a 76 y/o female admitted for R patellar fx s/p partial patellectomy. PMH including but not limited to DM, HTN and glaucoma    PT Comments    Patient is making progress toward mobility goals. Pt refusing rehab prior to d/c home. Pt reported she has needed equipment at home. Recommending HHPT for further skilled PT services to maximize independence and safety with mobility.    Follow Up Recommendations  Home health PT;Supervision for mobility/OOB     Equipment Recommendations  None recommended by PT    Recommendations for Other Services       Precautions / Restrictions Precautions Precautions: Fall Required Braces or Orthoses: Other Brace/Splint Other Brace/Splint: Bledsoe (R) Restrictions Weight Bearing Restrictions: Yes RLE Weight Bearing: Weight bearing as tolerated    Mobility  Bed Mobility Overal bed mobility: Modified Independent Bed Mobility: Supine to Sit     Supine to sit: Modified independent (Device/Increase time)     General bed mobility comments: pt OOB in chair upon arrival  Transfers Overall transfer level: Needs assistance Equipment used: Rolling walker (2 wheeled) Transfers: Sit to/from Stand Sit to Stand: Min guard Stand pivot transfers: Supervision       General transfer comment: min guard for safety; carry over of safe hand placement/technique  Ambulation/Gait Ambulation/Gait assistance: Min guard Ambulation Distance (Feet):  (69ft X2 with seated rest break) Assistive device: Rolling walker (2 wheeled) Gait Pattern/deviations: Step-through pattern;Decreased stride length     General Gait Details: cues for sequencing   Stairs Stairs: Yes   Stair Management: One rail Right;Step to pattern;Sideways Number of Stairs: 4 General stair comments: cues for sequencing and  technique  Wheelchair Mobility    Modified Rankin (Stroke Patients Only)       Balance Overall balance assessment: Needs assistance Sitting-balance support: Feet supported Sitting balance-Leahy Scale: Good     Standing balance support: During functional activity;Bilateral upper extremity supported Standing balance-Leahy Scale: Poor                              Cognition Arousal/Alertness: Awake/alert Behavior During Therapy: WFL for tasks assessed/performed Overall Cognitive Status: Within Functional Limits for tasks assessed                                        Exercises      General Comments        Pertinent Vitals/Pain Pain Assessment: No/denies pain Pain Intervention(s): Monitored during session    Home Living                      Prior Function            PT Goals (current goals can now be found in the care plan section) Acute Rehab PT Goals Patient Stated Goal: return home PT Goal Formulation: With patient Time For Goal Achievement: 07/22/16 Potential to Achieve Goals: Good Progress towards PT goals: Progressing toward goals    Frequency    Min 3X/week      PT Plan Discharge plan needs to be updated    Co-evaluation             End of Session Equipment Utilized During Treatment: Gait belt Activity Tolerance:  Patient tolerated treatment well Patient left: in chair;with call bell/phone within reach Nurse Communication: Mobility status PT Visit Diagnosis: Other abnormalities of gait and mobility (R26.89);Pain Pain - Right/Left: Right Pain - part of body: Knee     Time: 1322-1340 PT Time Calculation (min) (ACUTE ONLY): 18 min  Charges:  $Gait Training: 8-22 mins                    G Codes:       Earney Navy, PTA Pager: [phone]     Darliss Cheney 07/10/2016, 2:45 PM

## 2016-07-11 DIAGNOSIS — I10 Essential (primary) hypertension: Secondary | ICD-10-CM | POA: Diagnosis not present

## 2016-07-11 DIAGNOSIS — Z794 Long term (current) use of insulin: Secondary | ICD-10-CM | POA: Diagnosis not present

## 2016-07-11 DIAGNOSIS — Z7982 Long term (current) use of aspirin: Secondary | ICD-10-CM | POA: Diagnosis not present

## 2016-07-11 DIAGNOSIS — Z79891 Long term (current) use of opiate analgesic: Secondary | ICD-10-CM | POA: Diagnosis not present

## 2016-07-11 DIAGNOSIS — S82091D Other fracture of right patella, subsequent encounter for closed fracture with routine healing: Secondary | ICD-10-CM | POA: Diagnosis not present

## 2016-07-11 DIAGNOSIS — M1991 Primary osteoarthritis, unspecified site: Secondary | ICD-10-CM | POA: Diagnosis not present

## 2016-07-11 DIAGNOSIS — E119 Type 2 diabetes mellitus without complications: Secondary | ICD-10-CM | POA: Diagnosis not present

## 2016-07-13 DIAGNOSIS — M1991 Primary osteoarthritis, unspecified site: Secondary | ICD-10-CM | POA: Diagnosis not present

## 2016-07-13 DIAGNOSIS — Z7982 Long term (current) use of aspirin: Secondary | ICD-10-CM | POA: Diagnosis not present

## 2016-07-13 DIAGNOSIS — E119 Type 2 diabetes mellitus without complications: Secondary | ICD-10-CM | POA: Diagnosis not present

## 2016-07-13 DIAGNOSIS — Z79891 Long term (current) use of opiate analgesic: Secondary | ICD-10-CM | POA: Diagnosis not present

## 2016-07-13 DIAGNOSIS — S82091D Other fracture of right patella, subsequent encounter for closed fracture with routine healing: Secondary | ICD-10-CM | POA: Diagnosis not present

## 2016-07-13 DIAGNOSIS — I10 Essential (primary) hypertension: Secondary | ICD-10-CM | POA: Diagnosis not present

## 2016-07-13 DIAGNOSIS — Z794 Long term (current) use of insulin: Secondary | ICD-10-CM | POA: Diagnosis not present

## 2016-07-13 NOTE — Discharge Summary (Signed)
Physician Discharge Summary      Patient ID: Kayla Griffin MRN: [id number] DOB/AGE: [birth year] 76 y.o.  Admit date: 07/07/2016 Discharge date: 07/13/2016  Admission Diagnoses:  <principal problem not specified>  Discharge Diagnoses:  Active Problems:   History of open reduction and internal fixation (ORIF) procedure   Past Medical History:  Diagnosis Date  . Arthritis   . Cancer ([place])    Intermetrial CA  . Depression   . Diabetes mellitus without complication ([place])    Type II  . Dyspnea    due to pain  . Glaucoma   . Hypertension     Surgeries: Procedure(s): Partial Patellectomy on 07/07/2016   Consultants (if any):   Discharged Condition: Improved  Hospital Course: HADYN AZER is an 76 y.o. female who was admitted 07/07/2016 with a diagnosis of <principal problem not specified> and went to the operating room on 07/07/2016 and underwent the above named procedures.    She was given perioperative antibiotics:  Anti-infectives    Start     Dose/Rate Route Frequency Ordered Stop   07/07/16 2000  ceFAZolin (ANCEF) IVPB 2g/100 mL premix     2 g 200 mL/hr over 30 Minutes Intravenous Every 6 hours 07/07/16 1744 07/08/16 1108   07/07/16 0648  ceFAZolin (ANCEF) IVPB 2g/100 mL premix     2 g 200 mL/hr over 30 Minutes Intravenous On call to O.R. 07/07/16 0648 07/07/16 1410   07/07/16 0648  ceFAZolin (ANCEF) 2-4 GM/100ML-% IVPB    Comments:  Forte, Lindsi   : cabinet override      07/07/16 0648 07/07/16 1410    .  She was given sequential compression devices, early ambulation for DVT prophylaxis.  She benefited maximally from the hospital stay and there were no complications.    Recent vital signs:  Vitals:   07/09/16 2200 07/10/16 0500  BP: (!) 146/51 (!) 140/50  Pulse: 71 62  Resp:    Temp: 98.6 F (37 C) 98.4 F (36.9 C)    Recent laboratory studies:  Lab Results  Component Value Date   HGB 13.3 07/03/2016   Lab Results  Component Value Date   WBC 13.6 (H) 07/03/2016   PLT 290 07/03/2016   No results found for: INR Lab Results  Component Value Date   NA 130 (L) 07/03/2016   K 3.8 07/03/2016   CL 97 (L) 07/03/2016   CO2 24 07/03/2016   BUN 11 07/03/2016   CREATININE 0.74 07/03/2016   GLUCOSE 111 (H) 07/03/2016    Discharge Medications:   Allergies as of 07/10/2016      Reactions   Actos [pioglitazone] Swelling   SWELLING REACTION UNSPECIFIED       Medication List    STOP taking these medications   HYDROcodone-acetaminophen 5-325 MG tablet Commonly known as:  NORCO/VICODIN     TAKE these medications   aspirin EC 325 MG tablet Take 1 tablet (325 mg total) by mouth daily. What changed:  medication strength  how much to take   atorvastatin 40 MG tablet Commonly known as:  LIPITOR Take 40 mg by mouth daily.   DORZOLAMIDE HCL OP Place 1 drop into both eyes 2 (two) times daily.   hydrochlorothiazide 25 MG tablet Commonly known as:  HYDRODIURIL Take 25 mg by mouth daily.   insulin NPH-regular Human (70-30) 100 UNIT/ML injection Commonly known as:  NOVOLIN 70/30 Inject 30 Units into the skin 2 (two) times daily with a meal.   latanoprost 0.005 % ophthalmic  solution Commonly known as:  XALATAN Place 1 drop into both eyes at bedtime.   metFORMIN 500 MG tablet Commonly known as:  GLUCOPHAGE Take 500 mg by mouth 2 (two) times daily with a meal.   methocarbamol 750 MG tablet Commonly known as:  ROBAXIN Take 1 tablet (750 mg total) by mouth 2 (two) times daily as needed for muscle spasms.   metoprolol 100 MG tablet Commonly known as:  LOPRESSOR Take 100 mg by mouth 2 (two) times daily.   ondansetron 4 MG tablet Commonly known as:  ZOFRAN Take 1-2 tablets (4-8 mg total) by mouth every 8 (eight) hours as needed for nausea or vomiting.   oxyCODONE 10 mg 12 hr tablet Commonly known as:  OXYCONTIN Take 1 tablet (10 mg total) by mouth every 12 (twelve) hours.   oxyCODONE-acetaminophen 5-325 MG  tablet Commonly known as:  PERCOCET Take 1-2 tablets by mouth every 4 (four) hours as needed for severe pain.   promethazine 25 MG tablet Commonly known as:  PHENERGAN Take 1 tablet (25 mg total) by mouth every 6 (six) hours as needed for nausea.   ramipril 10 MG capsule Commonly known as:  ALTACE Take 20 mg by mouth daily.   senna-docusate 8.6-50 MG tablet Commonly known as:  SENOKOT S Take 1 tablet by mouth at bedtime as needed.       Diagnostic Studies: Dg Knee 1-2 Views Right  Result Date: 07/07/2016 CLINICAL DATA:  Portable fluoroscopic imaging for ORIF of a patellar fracture. EXAM: RIGHT KNEE - 1-2 VIEW; DG C-ARM 61-120 MIN COMPARISON:  07/03/2016 FINDINGS: Two operative images show the open reduction of the patellar fracture. The fracture components are in near anatomic alignment. There is no evidence of an operative complication. IMPRESSION: Operative imaging during ORIF of a right patellar fracture. Electronically Signed   By: Lajean Manes M.D.   On: 07/07/2016 16:26   Dg Knee Complete 4 Views Right  Result Date: 07/03/2016 CLINICAL DATA:  Fall today.  Right patellar pain. EXAM: RIGHT KNEE - COMPLETE 4+ VIEW COMPARISON:  None. FINDINGS: Comminuted transverse mid to lower right patellar fracture with mild 5 mm distraction of the dominant fracture fragments. Severe overlying soft tissue swelling. No additional fracture. Trace suprapatellar right knee joint effusion. No dislocation. No suspicious focal osseous lesion. Minimal medial and patellofemoral compartment osteoarthritis. No radiopaque foreign body. IMPRESSION: Comminuted transverse mid to lower right patellar fracture. Electronically Signed   By: Ilona Sorrel M.D.   On: 07/03/2016 16:01   Dg C-arm 1-60 Min  Result Date: 07/07/2016 CLINICAL DATA:  Portable fluoroscopic imaging for ORIF of a patellar fracture. EXAM: RIGHT KNEE - 1-2 VIEW; DG C-ARM 61-120 MIN COMPARISON:  07/03/2016 FINDINGS: Two operative images show the  open reduction of the patellar fracture. The fracture components are in near anatomic alignment. There is no evidence of an operative complication. IMPRESSION: Operative imaging during ORIF of a right patellar fracture. Electronically Signed   By: Lajean Manes M.D.   On: 07/07/2016 16:26    Disposition: 01-Home or Self Care  Discharge Instructions    Call MD / Call 911    Complete by:  As directed    If you experience chest pain or shortness of breath, CALL 911 and be transported to the hospital emergency room.  If you develope a fever above 101 F, pus (white drainage) or increased drainage or redness at the wound, or calf pain, call your surgeon's office.   Constipation Prevention    Complete  by:  As directed    Drink plenty of fluids.  Prune juice may be helpful.  You may use a stool softener, such as Colace (over the counter) 100 mg twice a day.  Use MiraLax (over the counter) for constipation as needed.   Diet - low sodium heart healthy    Complete by:  As directed    Increase activity slowly as tolerated    Complete by:  As directed       Follow-up Information    Sanda Klein, MD Follow up on 07/25/2016.   Specialty:  Cardiology Why:  at 2:00PM with Kerin Ransom, PA for Dr. Sallyanne Kuster.  Contact information: [address] [place] [address] [phone]        [place] Office Follow up on 07/21/2016.   Specialty:  Cardiology Why:  at 2:00pm  but come at 1:30 AM at [facility] street office.   Contact information: [address], [address] [place]       Eduard Roux, MD Follow up in 2 week(s).   Specialty:  Orthopedic Surgery Why:  For suture removal, For wound re-check Contact information: [place] [address] [phone]        [facility] Follow up.   Why:  [facility] will provide home health physical therapy, occupational therapy, and  an aide. Contact information: [address] [place] [address] [phone]            Signed: Eduard Roux 07/13/2016, 3:01 PM

## 2016-07-14 DIAGNOSIS — Z794 Long term (current) use of insulin: Secondary | ICD-10-CM | POA: Diagnosis not present

## 2016-07-14 DIAGNOSIS — I10 Essential (primary) hypertension: Secondary | ICD-10-CM | POA: Diagnosis not present

## 2016-07-14 DIAGNOSIS — M1991 Primary osteoarthritis, unspecified site: Secondary | ICD-10-CM | POA: Diagnosis not present

## 2016-07-14 DIAGNOSIS — E119 Type 2 diabetes mellitus without complications: Secondary | ICD-10-CM | POA: Diagnosis not present

## 2016-07-14 DIAGNOSIS — Z79891 Long term (current) use of opiate analgesic: Secondary | ICD-10-CM | POA: Diagnosis not present

## 2016-07-14 DIAGNOSIS — Z7982 Long term (current) use of aspirin: Secondary | ICD-10-CM | POA: Diagnosis not present

## 2016-07-14 DIAGNOSIS — S82091D Other fracture of right patella, subsequent encounter for closed fracture with routine healing: Secondary | ICD-10-CM | POA: Diagnosis not present

## 2016-07-17 ENCOUNTER — Telehealth: Payer: Self-pay | Admitting: Cardiovascular Disease

## 2016-07-17 DIAGNOSIS — I10 Essential (primary) hypertension: Secondary | ICD-10-CM | POA: Diagnosis not present

## 2016-07-17 DIAGNOSIS — M1991 Primary osteoarthritis, unspecified site: Secondary | ICD-10-CM | POA: Diagnosis not present

## 2016-07-17 DIAGNOSIS — Z794 Long term (current) use of insulin: Secondary | ICD-10-CM | POA: Diagnosis not present

## 2016-07-17 DIAGNOSIS — Z79891 Long term (current) use of opiate analgesic: Secondary | ICD-10-CM | POA: Diagnosis not present

## 2016-07-17 DIAGNOSIS — Z7982 Long term (current) use of aspirin: Secondary | ICD-10-CM | POA: Diagnosis not present

## 2016-07-17 DIAGNOSIS — S82091D Other fracture of right patella, subsequent encounter for closed fracture with routine healing: Secondary | ICD-10-CM | POA: Diagnosis not present

## 2016-07-17 DIAGNOSIS — E119 Type 2 diabetes mellitus without complications: Secondary | ICD-10-CM | POA: Diagnosis not present

## 2016-07-17 NOTE — Telephone Encounter (Signed)
Returned the phone call to the patient. Informed her that she should go ahead and have her ECHO done this Friday. Someone would call with the results and unless there was an severe abnormality with the results then she could wait for Dr. Victorino December first available. She stated that she understood.

## 2016-07-17 NOTE — Telephone Encounter (Signed)
Her follow up after the echo is not urgent. She can wait until first available appointment. Can call her back with results within a day or two after she has the echo, but unless there is an unexpected and severe abnormality on the echo, OK to wait for first appt available with me. Thanks [employer]

## 2016-07-17 NOTE — Telephone Encounter (Signed)
Returned the phone call to the patient. She has an appointment for an echo on 4/13 and then a follow up on 4/17 with a PA. She has currently been discharged from the hospital. Dr. Sallyanne Kuster had been assigned to see her in the hospital. She has not been seen by a cardiologist in the past. She is adamant that she will not be seen by a PA and only wants a physician. Dr. Sallyanne Kuster does not have any openings at this time. She wants to know if she may see another physician.

## 2016-07-17 NOTE — Telephone Encounter (Signed)
New message   Pt is calling about her appts coming up. She is upset over having an appt with Kerin Ransom on 07/25/16 after having an echo. She wants to see the doctor.

## 2016-07-18 ENCOUNTER — Inpatient Hospital Stay (INDEPENDENT_AMBULATORY_CARE_PROVIDER_SITE_OTHER): Payer: Self-pay | Admitting: Orthopaedic Surgery

## 2016-07-21 ENCOUNTER — Other Ambulatory Visit: Payer: Self-pay

## 2016-07-21 ENCOUNTER — Ambulatory Visit (INDEPENDENT_AMBULATORY_CARE_PROVIDER_SITE_OTHER): Payer: Medicare HMO

## 2016-07-21 ENCOUNTER — Ambulatory Visit (HOSPITAL_COMMUNITY): Payer: Medicare HMO | Attending: Cardiovascular Disease

## 2016-07-21 ENCOUNTER — Ambulatory Visit (INDEPENDENT_AMBULATORY_CARE_PROVIDER_SITE_OTHER): Payer: Medicare HMO | Admitting: Orthopaedic Surgery

## 2016-07-21 DIAGNOSIS — I517 Cardiomegaly: Secondary | ICD-10-CM | POA: Diagnosis not present

## 2016-07-21 DIAGNOSIS — S82031A Displaced transverse fracture of right patella, initial encounter for closed fracture: Secondary | ICD-10-CM

## 2016-07-21 DIAGNOSIS — R9431 Abnormal electrocardiogram [ECG] [EKG]: Secondary | ICD-10-CM | POA: Diagnosis not present

## 2016-07-21 NOTE — Progress Notes (Signed)
Kayla Griffin is 2 weeks status post partial cheilectomy and patellar tendon advancement. She is doing well. Surgical scar is clean dry and intact. No signs of infection. Her range of motion is 0-45 with good extension strength. The sutures were removed today. I did tell her that I only want her to ambulate within the confines of the brace. I do not want her to flex this more than what the brace will allow. I will see her back in 4 weeks for recheck. No x-rays needed. We'll likely will likely open up the brace.

## 2016-07-25 ENCOUNTER — Ambulatory Visit: Payer: Medicare HMO | Admitting: Cardiology

## 2016-07-26 ENCOUNTER — Telehealth: Payer: Self-pay | Admitting: Cardiovascular Disease

## 2016-07-26 DIAGNOSIS — S82091D Other fracture of right patella, subsequent encounter for closed fracture with routine healing: Secondary | ICD-10-CM | POA: Diagnosis not present

## 2016-07-26 DIAGNOSIS — Z794 Long term (current) use of insulin: Secondary | ICD-10-CM | POA: Diagnosis not present

## 2016-07-26 DIAGNOSIS — Z7982 Long term (current) use of aspirin: Secondary | ICD-10-CM | POA: Diagnosis not present

## 2016-07-26 DIAGNOSIS — M1991 Primary osteoarthritis, unspecified site: Secondary | ICD-10-CM | POA: Diagnosis not present

## 2016-07-26 DIAGNOSIS — E119 Type 2 diabetes mellitus without complications: Secondary | ICD-10-CM | POA: Diagnosis not present

## 2016-07-26 DIAGNOSIS — Z79891 Long term (current) use of opiate analgesic: Secondary | ICD-10-CM | POA: Diagnosis not present

## 2016-07-26 DIAGNOSIS — I10 Essential (primary) hypertension: Secondary | ICD-10-CM | POA: Diagnosis not present

## 2016-07-26 NOTE — Telephone Encounter (Signed)
New Message     Pt is requesting to switch cardiologists she no longer wants to go to the [place] office . Please refer her to cardiologist at [facility] and she does not want to she a APP.  When this is approved please call her with an appointment.

## 2016-07-27 NOTE — Telephone Encounter (Signed)
No objection MCr 

## 2016-07-28 ENCOUNTER — Encounter (HOSPITAL_COMMUNITY): Payer: Self-pay | Admitting: Orthopaedic Surgery

## 2016-07-28 NOTE — Addendum Note (Signed)
Addendum  created 07/28/16 1046 by Josephine Igo, CRNA   Anesthesia Event edited, Anesthesia Staff edited

## 2016-07-31 ENCOUNTER — Telehealth (INDEPENDENT_AMBULATORY_CARE_PROVIDER_SITE_OTHER): Payer: Self-pay | Admitting: Orthopaedic Surgery

## 2016-07-31 NOTE — Telephone Encounter (Signed)
Barnabas Lister from [facility] called saying that the patient canceled her appointment with PT today and will be having her final PT session Wednesday. Shes doing very well, but he suggested outpatient care. CB # [phone]

## 2016-07-31 NOTE — Telephone Encounter (Signed)
See message.

## 2016-07-31 NOTE — Telephone Encounter (Signed)
ok 

## 2016-08-02 DIAGNOSIS — Z7982 Long term (current) use of aspirin: Secondary | ICD-10-CM | POA: Diagnosis not present

## 2016-08-02 DIAGNOSIS — E119 Type 2 diabetes mellitus without complications: Secondary | ICD-10-CM | POA: Diagnosis not present

## 2016-08-02 DIAGNOSIS — Z79891 Long term (current) use of opiate analgesic: Secondary | ICD-10-CM | POA: Diagnosis not present

## 2016-08-02 DIAGNOSIS — Z794 Long term (current) use of insulin: Secondary | ICD-10-CM | POA: Diagnosis not present

## 2016-08-02 DIAGNOSIS — I10 Essential (primary) hypertension: Secondary | ICD-10-CM | POA: Diagnosis not present

## 2016-08-02 DIAGNOSIS — S82091D Other fracture of right patella, subsequent encounter for closed fracture with routine healing: Secondary | ICD-10-CM | POA: Diagnosis not present

## 2016-08-02 DIAGNOSIS — M1991 Primary osteoarthritis, unspecified site: Secondary | ICD-10-CM | POA: Diagnosis not present

## 2016-08-10 ENCOUNTER — Ambulatory Visit (INDEPENDENT_AMBULATORY_CARE_PROVIDER_SITE_OTHER): Payer: Medicare HMO | Admitting: Cardiology

## 2016-08-10 ENCOUNTER — Encounter: Payer: Self-pay | Admitting: Cardiology

## 2016-08-10 ENCOUNTER — Encounter (INDEPENDENT_AMBULATORY_CARE_PROVIDER_SITE_OTHER): Payer: Self-pay

## 2016-08-10 VITALS — BP 130/78 | HR 65 | Ht 65.0 in | Wt 199.8 lb

## 2016-08-10 DIAGNOSIS — E119 Type 2 diabetes mellitus without complications: Secondary | ICD-10-CM | POA: Diagnosis not present

## 2016-08-10 DIAGNOSIS — R9431 Abnormal electrocardiogram [ECG] [EKG]: Secondary | ICD-10-CM | POA: Diagnosis not present

## 2016-08-10 DIAGNOSIS — I10 Essential (primary) hypertension: Secondary | ICD-10-CM

## 2016-08-10 NOTE — Patient Instructions (Signed)
Medication Instructions:  The current medical regimen is effective;  continue present plan and medications.  Follow-Up: Follow up as needed with Dr Skains.  Thank you for choosing [facility]!!     

## 2016-08-10 NOTE — Progress Notes (Signed)
Cardiology Office Note:    Date:  08/10/2016   ID:  Kayla [facility], DOB [birth year], MRN [id number]  PCP:  Kayla Nip, MD  Cardiologist:  Kayla Furbish, MD  previously seen by Dr. Sallyanne [facility] on 07/07/16 in consultation  Referring MD: Kayla Nip, MD     History of Present Illness:    [facility] is a 76 y.o. female with a hx of Diabetes, hypertension, hyperlipidemia with abnormal EKG seen previously on 06/10/16  by Dr. Sallyanne Griffin for preoperative evaluation. Her EKG demonstrated new Inferior Q waves. She was asymptomatic. She has risk factors and family history of CAD.  An echocardiogram was performed on 07/21/16 which was reassuring. See below for details.  Minor shortness of breath walking currently with her walker she continues her rehabilitation efforts. She sometimes will have "heartburn", will drink milk or water and it usually relieves itself. She sees Dr. Buddy [facility] for her diabetes.  Past Medical History:  Diagnosis Date  . Arthritis   . Cancer ([place])    Intermetrial CA  . Depression   . Diabetes mellitus without complication ([place])    Type II  . Dyspnea    due to pain  . Glaucoma   . Hypertension     Past Surgical History:  Procedure Laterality Date  . ABDOMINAL HYSTERECTOMY    . CERVICAL FUSION  2007  . EYE SURGERY Bilateral   . ORIF PATELLA Right 07/07/2016   Procedure: Partial Patellectomy;  Surgeon: Kayla Koyanagi, MD;  [facility]: [facility];  Service: Orthopedics;  Laterality: Right;    Current Medications: Current Meds  Medication Sig  . aspirin EC 325 MG tablet Take 1 tablet (325 mg total) by mouth daily.  Marland Kitchen atorvastatin (LIPITOR) 40 MG tablet Take 40 mg by mouth daily.   . DORZOLAMIDE HCL OP Place 1 drop into both eyes 2 (two) times daily.  . hydrochlorothiazide (HYDRODIURIL) 25 MG tablet Take 25 mg by mouth daily.   . insulin NPH-regular Human (NOVOLIN 70/30) (70-30) 100 UNIT/ML injection Inject 30 Units into the skin 2 (two) times daily with a meal.  .  latanoprost (XALATAN) 0.005 % ophthalmic solution Place 1 drop into both eyes at bedtime.   . metFORMIN (GLUCOPHAGE) 500 MG tablet Take 500 mg by mouth 2 (two) times daily with a meal.  . methocarbamol (ROBAXIN) 750 MG tablet Take 1 tablet (750 mg total) by mouth 2 (two) times daily as needed for muscle spasms.  . metoprolol (LOPRESSOR) 100 MG tablet Take 100 mg by mouth 2 (two) times daily.  Marland Kitchen oxyCODONE-acetaminophen (PERCOCET) 5-325 MG tablet Take 1-2 tablets by mouth every 4 (four) hours as needed for severe pain.  . ramipril (ALTACE) 10 MG capsule Take 20 mg by mouth daily.      Allergies:   Actos [pioglitazone]   Social History   Social History  . Marital status: Single    Spouse name: N/A  . Number of children: N/A  . Years of education: N/A   Social History Main Topics  . Smoking status: Never Smoker  . Smokeless tobacco: Never Used  . Alcohol use No  . Drug use: No  . Sexual activity: Not Asked   Other Topics Concern  . None   Social History Narrative  . None     Family History: The patient's family history includes Arrhythmia in her sister; CVA in her mother and sister; Heart attack in her sister; Heart disease in her sister; Heart disease (age of onset: 1)  in her father.  ROS:   Please see the history of present illness.    All other systems reviewed and are negative.  EKGs/Labs/Other Studies Reviewed:    The following studies were reviewed today:  Echocardiogram 07/21/16: - Left ventricle: The cavity size was normal. There was mild   concentric hypertrophy. Systolic function was normal. The   estimated ejection fraction was in the range of 55% to 60%. Wall   motion was normal; there were no regional wall motion   abnormalities. Doppler parameters are consistent with abnormal   left ventricular relaxation (grade 1 diastolic dysfunction).   Doppler parameters are consistent with indeterminate ventricular   filling pressure. - Aortic valve: Transvalvular  velocity was within the normal range.   There was no stenosis. There was no regurgitation. - Mitral valve: Transvalvular velocity was within the normal range.   There was no evidence for stenosis. There was trivial   regurgitation. - Left atrium: The atrium was severely dilated. - Right ventricle: The cavity size was normal. Wall thickness was   normal. Systolic function was normal. - Tricuspid valve: There was no regurgitation.  EKG:  07/07/16-sinus rhythm with inferior Q waves personally seen.  Recent Labs: 07/03/2016: BUN 11; Creatinine, Ser 0.74; Hemoglobin 13.3; Platelets 290; Potassium 3.8; Sodium 130   Recent Lipid Panel No results found for: CHOL, TRIG, HDL, CHOLHDL, VLDL, LDLCALC, LDLDIRECT  Physical Exam:    VS:  BP 130/78   Pulse 65   Ht 5\' 5"  (1.651 m)   Wt 199 lb 12.8 oz (90.6 kg)   SpO2 99%   BMI 33.25 kg/m     Wt Readings from Last 3 Encounters:  08/10/16 199 lb 12.8 oz (90.6 kg)  07/07/16 208 lb (94.3 kg)     GEN:  Well nourished, well developed in no acute distress HEENT: Normal NECK: No JVD; No carotid bruits LYMPHATICS: No lymphadenopathy CARDIAC: RRR, no murmurs, rubs, gallops RESPIRATORY:  Clear to auscultation without rales, wheezing or rhonchi  ABDOMEN: Soft, non-tender, non-distended MUSCULOSKELETAL:  No edema; No deformity Currently in right knee brace. Postop changes noted. SKIN: Warm and dry NEUROLOGIC:  Alert and oriented x 3 PSYCHIATRIC:  Normal affect   ASSESSMENT:    1. Abnormal EKG   2. Essential hypertension   3. Diabetes mellitus with coincident hypertension (HCC)    PLAN:    In order of problems listed above:  Abnormal EKG  - Thankfully, echocardiogram shows normal ejection fraction and no wall motion abnormalities, Personally viewed. There is no evidence of prior infarct pattern. Excellent.  - Previously determined to be low risk for surgery. Agree.  - She underwent knee surgery, Dr. Erlinda [facility], doing well.  - Continue to work on  conditioning.  Essential hypertension  - Followed by [facility]. Excellent. Currently well controlled.  Hyperlipidemia  - No changes, per [facility].  Diabetes with hypertension/hyperlipidemia  - Per primary team.  No need for cardiology follow-up. Please let us know if we can be of further assistance.   Medication Adjustments/Labs and Tests Ordered: Current medicines are reviewed at length with the patient today.  Concerns regarding medicines are outlined above. Labs and tests ordered and medication changes are outlined in the patient instructions below:  Patient Instructions  Medication Instructions:  The current medical regimen is effective;  continue present plan and medications.  Follow-Up: Follow up as needed with Dr Marlou Porch.  Thank you for choosing [facility]!!        Signed, Kayla Furbish,  MD  08/10/2016 1:34 PM    [facility]

## 2016-08-17 ENCOUNTER — Ambulatory Visit (INDEPENDENT_AMBULATORY_CARE_PROVIDER_SITE_OTHER): Payer: Medicare HMO | Admitting: Orthopaedic Surgery

## 2016-08-17 ENCOUNTER — Encounter (INDEPENDENT_AMBULATORY_CARE_PROVIDER_SITE_OTHER): Payer: Self-pay | Admitting: Orthopaedic Surgery

## 2016-08-17 DIAGNOSIS — S82031A Displaced transverse fracture of right patella, initial encounter for closed fracture: Secondary | ICD-10-CM

## 2016-08-17 NOTE — Progress Notes (Signed)
[facility] is 6 weeks status post partial patellectomy and patellar tendon advancement. She is doing well. She denies any pain. She is ready to remove the Bledsoe brace. On exam her incision is healed. She has a small area that has been abraded by the brace. There is no signs of infection or drainage. I recommend Bactroban ointment to this area once a day. At this point she can discontinue Bledsoe brace. Referral for outpatient physical therapy was given today. I will like to see her back in about 6 weeks or so.

## 2016-08-24 DIAGNOSIS — M25561 Pain in right knee: Secondary | ICD-10-CM | POA: Diagnosis not present

## 2016-08-30 DIAGNOSIS — M25561 Pain in right knee: Secondary | ICD-10-CM | POA: Diagnosis not present

## 2016-09-06 DIAGNOSIS — M25561 Pain in right knee: Secondary | ICD-10-CM | POA: Diagnosis not present

## 2016-09-13 DIAGNOSIS — M25561 Pain in right knee: Secondary | ICD-10-CM | POA: Diagnosis not present

## 2016-10-24 ENCOUNTER — Ambulatory Visit (INDEPENDENT_AMBULATORY_CARE_PROVIDER_SITE_OTHER): Payer: Medicare HMO | Admitting: Orthopaedic Surgery

## 2016-10-24 ENCOUNTER — Encounter (INDEPENDENT_AMBULATORY_CARE_PROVIDER_SITE_OTHER): Payer: Self-pay | Admitting: Orthopaedic Surgery

## 2016-10-24 DIAGNOSIS — S82031D Displaced transverse fracture of right patella, subsequent encounter for closed fracture with routine healing: Secondary | ICD-10-CM | POA: Diagnosis not present

## 2016-10-24 DIAGNOSIS — S82031A Displaced transverse fracture of right patella, initial encounter for closed fracture: Secondary | ICD-10-CM

## 2016-10-24 NOTE — Progress Notes (Signed)
   Office Visit Note   Patient: [facility]           Date of Birth: 22-Mar-1941           MRN: [id number] Visit Date: 10/24/2016              Requested by: Aretta Nip, [place], [place] [address] PCP: Aretta Nip, MD   Assessment & Plan: Visit Diagnoses:  1. Displaced transverse fracture of right patella, initial encounter for closed fracture     Plan: Patient has reached MMI at this point. She doing well and resumed her normal activities. Follow-up with me as needed.  Follow-Up Instructions: Return if symptoms worsen or fail to improve.   Orders:  No orders of the defined types were placed in this encounter.  No orders of the defined types were placed in this encounter.     Procedures: No procedures performed   Clinical Data: No additional findings.   Subjective: Chief Complaint  Patient presents with  . Right Knee - Pain, Follow-up    [facility] is 109 day status post partial patellectomy. She doing well. She states that she is back to normal.    Review of Systems   Objective: Vital Signs: There were no vitals taken for this visit.  Physical Exam  Ortho Exam She is ambulatory with a cane. Surgical scar is fully healed. Excellent range of motion without pain. Good extensor strength. Specialty Comments:  No specialty comments available.  Imaging: No results found.   PMFS History: Patient Active Problem List   Diagnosis Date Noted  . Abnormal EKG 08/10/2016  . Diabetes mellitus with coincident hypertension ([place]) 08/10/2016  . History of open reduction and internal fixation (ORIF) procedure 07/07/2016  . Displaced transverse fracture of right patella, initial encounter for closed fracture 07/04/2016   Past Medical History:  Diagnosis Date  . Arthritis   . Cancer ([place])    Intermetrial CA  . Depression   . Diabetes mellitus without complication ([place])    Type II  . Dyspnea    due to pain  . Glaucoma     . Hypertension     Family History  Problem Relation Age of Onset  . CVA Mother   . Heart disease Father 22  . Heart attack Sister   . Heart disease Sister   . Arrhythmia Sister   . CVA Sister     Past Surgical History:  Procedure Laterality Date  . ABDOMINAL HYSTERECTOMY    . CERVICAL FUSION  2007  . EYE SURGERY Bilateral   . ORIF PATELLA Right 07/07/2016   Procedure: Partial Patellectomy;  Surgeon: Leandrew Koyanagi, MD;  Location: [facility];  Service: Orthopedics;  Laterality: Right;   Social History   Occupational History  . Not on file.   Social History Main Topics  . Smoking status: Never Smoker  . Smokeless tobacco: Never Used  . Alcohol use No  . Drug use: No  . Sexual activity: Not on file

## 2016-11-23 DIAGNOSIS — H35363 Drusen (degenerative) of macula, bilateral: Secondary | ICD-10-CM | POA: Diagnosis not present

## 2016-11-23 DIAGNOSIS — H401132 Primary open-angle glaucoma, bilateral, moderate stage: Secondary | ICD-10-CM | POA: Diagnosis not present

## 2016-11-23 DIAGNOSIS — H3561 Retinal hemorrhage, right eye: Secondary | ICD-10-CM | POA: Diagnosis not present

## 2016-11-23 DIAGNOSIS — E113293 Type 2 diabetes mellitus with mild nonproliferative diabetic retinopathy without macular edema, bilateral: Secondary | ICD-10-CM | POA: Diagnosis not present

## 2017-01-08 DIAGNOSIS — E871 Hypo-osmolality and hyponatremia: Secondary | ICD-10-CM | POA: Diagnosis not present

## 2017-01-08 DIAGNOSIS — Z794 Long term (current) use of insulin: Secondary | ICD-10-CM | POA: Diagnosis not present

## 2017-01-08 DIAGNOSIS — M810 Age-related osteoporosis without current pathological fracture: Secondary | ICD-10-CM | POA: Diagnosis not present

## 2017-01-08 DIAGNOSIS — I10 Essential (primary) hypertension: Secondary | ICD-10-CM | POA: Diagnosis not present

## 2017-01-08 DIAGNOSIS — Z23 Encounter for immunization: Secondary | ICD-10-CM | POA: Diagnosis not present

## 2017-01-08 DIAGNOSIS — Z5181 Encounter for therapeutic drug level monitoring: Secondary | ICD-10-CM | POA: Diagnosis not present

## 2017-01-08 DIAGNOSIS — E11319 Type 2 diabetes mellitus with unspecified diabetic retinopathy without macular edema: Secondary | ICD-10-CM | POA: Diagnosis not present

## 2017-01-26 DIAGNOSIS — E871 Hypo-osmolality and hyponatremia: Secondary | ICD-10-CM | POA: Diagnosis not present

## 2017-02-02 DIAGNOSIS — E871 Hypo-osmolality and hyponatremia: Secondary | ICD-10-CM | POA: Diagnosis not present

## 2017-02-20 DIAGNOSIS — E119 Type 2 diabetes mellitus without complications: Secondary | ICD-10-CM | POA: Diagnosis not present

## 2017-02-20 DIAGNOSIS — H353132 Nonexudative age-related macular degeneration, bilateral, intermediate dry stage: Secondary | ICD-10-CM | POA: Diagnosis not present

## 2017-02-20 DIAGNOSIS — E113293 Type 2 diabetes mellitus with mild nonproliferative diabetic retinopathy without macular edema, bilateral: Secondary | ICD-10-CM | POA: Diagnosis not present

## 2017-02-20 DIAGNOSIS — H401232 Low-tension glaucoma, bilateral, moderate stage: Secondary | ICD-10-CM | POA: Diagnosis not present

## 2017-04-17 DIAGNOSIS — E785 Hyperlipidemia, unspecified: Secondary | ICD-10-CM | POA: Diagnosis not present

## 2017-04-17 DIAGNOSIS — Z794 Long term (current) use of insulin: Secondary | ICD-10-CM | POA: Diagnosis not present

## 2017-04-17 DIAGNOSIS — E11319 Type 2 diabetes mellitus with unspecified diabetic retinopathy without macular edema: Secondary | ICD-10-CM | POA: Diagnosis not present

## 2017-04-17 DIAGNOSIS — I10 Essential (primary) hypertension: Secondary | ICD-10-CM | POA: Diagnosis not present

## 2017-04-17 DIAGNOSIS — Z0000 Encounter for general adult medical examination without abnormal findings: Secondary | ICD-10-CM | POA: Diagnosis not present

## 2017-04-17 DIAGNOSIS — M810 Age-related osteoporosis without current pathological fracture: Secondary | ICD-10-CM | POA: Diagnosis not present

## 2017-06-11 DIAGNOSIS — M810 Age-related osteoporosis without current pathological fracture: Secondary | ICD-10-CM | POA: Diagnosis not present

## 2017-06-11 DIAGNOSIS — M8588 Other specified disorders of bone density and structure, other site: Secondary | ICD-10-CM | POA: Diagnosis not present

## 2017-07-12 DIAGNOSIS — I10 Essential (primary) hypertension: Secondary | ICD-10-CM | POA: Diagnosis not present

## 2017-07-12 DIAGNOSIS — E871 Hypo-osmolality and hyponatremia: Secondary | ICD-10-CM | POA: Diagnosis not present

## 2017-07-12 DIAGNOSIS — Z794 Long term (current) use of insulin: Secondary | ICD-10-CM | POA: Diagnosis not present

## 2017-07-12 DIAGNOSIS — M810 Age-related osteoporosis without current pathological fracture: Secondary | ICD-10-CM | POA: Diagnosis not present

## 2017-07-12 DIAGNOSIS — E11319 Type 2 diabetes mellitus with unspecified diabetic retinopathy without macular edema: Secondary | ICD-10-CM | POA: Diagnosis not present

## 2017-07-12 DIAGNOSIS — Z5181 Encounter for therapeutic drug level monitoring: Secondary | ICD-10-CM | POA: Diagnosis not present

## 2017-07-31 DIAGNOSIS — H401232 Low-tension glaucoma, bilateral, moderate stage: Secondary | ICD-10-CM | POA: Diagnosis not present

## 2017-12-20 DIAGNOSIS — H401132 Primary open-angle glaucoma, bilateral, moderate stage: Secondary | ICD-10-CM | POA: Diagnosis not present

## 2017-12-20 DIAGNOSIS — H35363 Drusen (degenerative) of macula, bilateral: Secondary | ICD-10-CM | POA: Diagnosis not present

## 2017-12-20 DIAGNOSIS — H3561 Retinal hemorrhage, right eye: Secondary | ICD-10-CM | POA: Diagnosis not present

## 2017-12-20 DIAGNOSIS — H33321 Round hole, right eye: Secondary | ICD-10-CM | POA: Diagnosis not present

## 2017-12-20 DIAGNOSIS — H33322 Round hole, left eye: Secondary | ICD-10-CM | POA: Diagnosis not present

## 2017-12-20 DIAGNOSIS — E113293 Type 2 diabetes mellitus with mild nonproliferative diabetic retinopathy without macular edema, bilateral: Secondary | ICD-10-CM | POA: Diagnosis not present

## 2018-01-15 DIAGNOSIS — Z5181 Encounter for therapeutic drug level monitoring: Secondary | ICD-10-CM | POA: Diagnosis not present

## 2018-01-15 DIAGNOSIS — Z794 Long term (current) use of insulin: Secondary | ICD-10-CM | POA: Diagnosis not present

## 2018-01-15 DIAGNOSIS — Z23 Encounter for immunization: Secondary | ICD-10-CM | POA: Diagnosis not present

## 2018-01-15 DIAGNOSIS — M810 Age-related osteoporosis without current pathological fracture: Secondary | ICD-10-CM | POA: Diagnosis not present

## 2018-01-15 DIAGNOSIS — I10 Essential (primary) hypertension: Secondary | ICD-10-CM | POA: Diagnosis not present

## 2018-01-15 DIAGNOSIS — E11319 Type 2 diabetes mellitus with unspecified diabetic retinopathy without macular edema: Secondary | ICD-10-CM | POA: Diagnosis not present

## 2018-01-15 DIAGNOSIS — E871 Hypo-osmolality and hyponatremia: Secondary | ICD-10-CM | POA: Diagnosis not present

## 2018-02-07 DIAGNOSIS — H353132 Nonexudative age-related macular degeneration, bilateral, intermediate dry stage: Secondary | ICD-10-CM | POA: Diagnosis not present

## 2018-02-07 DIAGNOSIS — E119 Type 2 diabetes mellitus without complications: Secondary | ICD-10-CM | POA: Diagnosis not present

## 2018-02-07 DIAGNOSIS — H401232 Low-tension glaucoma, bilateral, moderate stage: Secondary | ICD-10-CM | POA: Diagnosis not present

## 2018-02-07 DIAGNOSIS — E113291 Type 2 diabetes mellitus with mild nonproliferative diabetic retinopathy without macular edema, right eye: Secondary | ICD-10-CM | POA: Diagnosis not present

## 2018-04-23 DIAGNOSIS — Z0000 Encounter for general adult medical examination without abnormal findings: Secondary | ICD-10-CM | POA: Diagnosis not present

## 2018-04-23 DIAGNOSIS — Z1211 Encounter for screening for malignant neoplasm of colon: Secondary | ICD-10-CM | POA: Diagnosis not present

## 2018-04-23 DIAGNOSIS — Z794 Long term (current) use of insulin: Secondary | ICD-10-CM | POA: Diagnosis not present

## 2018-04-23 DIAGNOSIS — E871 Hypo-osmolality and hyponatremia: Secondary | ICD-10-CM | POA: Diagnosis not present

## 2018-04-23 DIAGNOSIS — M810 Age-related osteoporosis without current pathological fracture: Secondary | ICD-10-CM | POA: Diagnosis not present

## 2018-04-23 DIAGNOSIS — E113291 Type 2 diabetes mellitus with mild nonproliferative diabetic retinopathy without macular edema, right eye: Secondary | ICD-10-CM | POA: Diagnosis not present

## 2018-04-23 DIAGNOSIS — E7800 Pure hypercholesterolemia, unspecified: Secondary | ICD-10-CM | POA: Diagnosis not present

## 2018-04-23 DIAGNOSIS — I10 Essential (primary) hypertension: Secondary | ICD-10-CM | POA: Diagnosis not present

## 2018-04-23 DIAGNOSIS — Z7984 Long term (current) use of oral hypoglycemic drugs: Secondary | ICD-10-CM | POA: Diagnosis not present

## 2018-07-18 DIAGNOSIS — Z794 Long term (current) use of insulin: Secondary | ICD-10-CM | POA: Diagnosis not present

## 2018-07-18 DIAGNOSIS — I10 Essential (primary) hypertension: Secondary | ICD-10-CM | POA: Diagnosis not present

## 2018-07-18 DIAGNOSIS — E11319 Type 2 diabetes mellitus with unspecified diabetic retinopathy without macular edema: Secondary | ICD-10-CM | POA: Diagnosis not present

## 2018-07-18 DIAGNOSIS — M810 Age-related osteoporosis without current pathological fracture: Secondary | ICD-10-CM | POA: Diagnosis not present

## 2018-07-18 DIAGNOSIS — Z5181 Encounter for therapeutic drug level monitoring: Secondary | ICD-10-CM | POA: Diagnosis not present

## 2018-10-22 DIAGNOSIS — I10 Essential (primary) hypertension: Secondary | ICD-10-CM | POA: Diagnosis not present

## 2018-10-22 DIAGNOSIS — N8110 Cystocele, unspecified: Secondary | ICD-10-CM | POA: Diagnosis not present

## 2018-10-22 DIAGNOSIS — R350 Frequency of micturition: Secondary | ICD-10-CM | POA: Diagnosis not present

## 2018-11-04 DIAGNOSIS — R197 Diarrhea, unspecified: Secondary | ICD-10-CM | POA: Diagnosis not present

## 2018-11-12 DIAGNOSIS — N819 Female genital prolapse, unspecified: Secondary | ICD-10-CM | POA: Diagnosis not present

## 2018-11-12 DIAGNOSIS — N8111 Cystocele, midline: Secondary | ICD-10-CM | POA: Diagnosis not present

## 2018-11-15 DIAGNOSIS — R339 Retention of urine, unspecified: Secondary | ICD-10-CM | POA: Diagnosis not present

## 2018-11-15 DIAGNOSIS — E6609 Other obesity due to excess calories: Secondary | ICD-10-CM | POA: Diagnosis not present

## 2018-11-15 DIAGNOSIS — N8111 Cystocele, midline: Secondary | ICD-10-CM | POA: Diagnosis not present

## 2018-11-15 DIAGNOSIS — N819 Female genital prolapse, unspecified: Secondary | ICD-10-CM | POA: Diagnosis not present

## 2018-11-15 DIAGNOSIS — N398 Other specified disorders of urinary system: Secondary | ICD-10-CM | POA: Diagnosis not present

## 2018-11-15 DIAGNOSIS — N816 Rectocele: Secondary | ICD-10-CM | POA: Diagnosis not present

## 2018-11-15 DIAGNOSIS — Z6836 Body mass index (BMI) 36.0-36.9, adult: Secondary | ICD-10-CM | POA: Diagnosis not present

## 2018-12-04 DIAGNOSIS — N813 Complete uterovaginal prolapse: Secondary | ICD-10-CM | POA: Diagnosis not present

## 2018-12-04 DIAGNOSIS — R338 Other retention of urine: Secondary | ICD-10-CM | POA: Diagnosis not present

## 2018-12-04 DIAGNOSIS — N816 Rectocele: Secondary | ICD-10-CM | POA: Diagnosis not present

## 2018-12-12 DIAGNOSIS — N8111 Cystocele, midline: Secondary | ICD-10-CM | POA: Diagnosis not present

## 2018-12-12 DIAGNOSIS — N398 Other specified disorders of urinary system: Secondary | ICD-10-CM | POA: Diagnosis not present

## 2018-12-12 DIAGNOSIS — N816 Rectocele: Secondary | ICD-10-CM | POA: Diagnosis not present

## 2018-12-12 DIAGNOSIS — Z4689 Encounter for fitting and adjustment of other specified devices: Secondary | ICD-10-CM | POA: Diagnosis not present

## 2018-12-17 DIAGNOSIS — H401232 Low-tension glaucoma, bilateral, moderate stage: Secondary | ICD-10-CM | POA: Diagnosis not present

## 2018-12-24 DIAGNOSIS — H401132 Primary open-angle glaucoma, bilateral, moderate stage: Secondary | ICD-10-CM | POA: Diagnosis not present

## 2018-12-24 DIAGNOSIS — E113293 Type 2 diabetes mellitus with mild nonproliferative diabetic retinopathy without macular edema, bilateral: Secondary | ICD-10-CM | POA: Diagnosis not present

## 2018-12-24 DIAGNOSIS — H43812 Vitreous degeneration, left eye: Secondary | ICD-10-CM | POA: Diagnosis not present

## 2018-12-24 DIAGNOSIS — H31009 Unspecified chorioretinal scars, unspecified eye: Secondary | ICD-10-CM | POA: Diagnosis not present

## 2019-01-17 DIAGNOSIS — M810 Age-related osteoporosis without current pathological fracture: Secondary | ICD-10-CM | POA: Diagnosis not present

## 2019-01-17 DIAGNOSIS — Z5181 Encounter for therapeutic drug level monitoring: Secondary | ICD-10-CM | POA: Diagnosis not present

## 2019-01-17 DIAGNOSIS — E11319 Type 2 diabetes mellitus with unspecified diabetic retinopathy without macular edema: Secondary | ICD-10-CM | POA: Diagnosis not present

## 2019-01-17 DIAGNOSIS — Z23 Encounter for immunization: Secondary | ICD-10-CM | POA: Diagnosis not present

## 2019-01-17 DIAGNOSIS — I10 Essential (primary) hypertension: Secondary | ICD-10-CM | POA: Diagnosis not present

## 2019-01-17 DIAGNOSIS — Z794 Long term (current) use of insulin: Secondary | ICD-10-CM | POA: Diagnosis not present

## 2019-01-17 DIAGNOSIS — E1165 Type 2 diabetes mellitus with hyperglycemia: Secondary | ICD-10-CM | POA: Diagnosis not present

## 2019-02-17 DIAGNOSIS — M545 Low back pain: Secondary | ICD-10-CM | POA: Diagnosis not present

## 2019-02-17 DIAGNOSIS — I10 Essential (primary) hypertension: Secondary | ICD-10-CM | POA: Diagnosis not present

## 2019-02-17 DIAGNOSIS — M533 Sacrococcygeal disorders, not elsewhere classified: Secondary | ICD-10-CM | POA: Diagnosis not present

## 2019-03-13 DIAGNOSIS — Z4689 Encounter for fitting and adjustment of other specified devices: Secondary | ICD-10-CM | POA: Diagnosis not present

## 2019-03-13 DIAGNOSIS — N816 Rectocele: Secondary | ICD-10-CM | POA: Diagnosis not present

## 2019-03-13 DIAGNOSIS — N819 Female genital prolapse, unspecified: Secondary | ICD-10-CM | POA: Diagnosis not present

## 2019-03-13 DIAGNOSIS — N8111 Cystocele, midline: Secondary | ICD-10-CM | POA: Diagnosis not present

## 2019-06-19 DIAGNOSIS — H401232 Low-tension glaucoma, bilateral, moderate stage: Secondary | ICD-10-CM | POA: Diagnosis not present

## 2019-06-19 DIAGNOSIS — E119 Type 2 diabetes mellitus without complications: Secondary | ICD-10-CM | POA: Diagnosis not present

## 2019-06-19 DIAGNOSIS — Z961 Presence of intraocular lens: Secondary | ICD-10-CM | POA: Diagnosis not present

## 2019-06-19 DIAGNOSIS — H353132 Nonexudative age-related macular degeneration, bilateral, intermediate dry stage: Secondary | ICD-10-CM | POA: Diagnosis not present

## 2019-07-25 DIAGNOSIS — Z79899 Other long term (current) drug therapy: Secondary | ICD-10-CM | POA: Diagnosis not present

## 2019-07-25 DIAGNOSIS — Z794 Long term (current) use of insulin: Secondary | ICD-10-CM | POA: Diagnosis not present

## 2019-07-25 DIAGNOSIS — E11319 Type 2 diabetes mellitus with unspecified diabetic retinopathy without macular edema: Secondary | ICD-10-CM | POA: Diagnosis not present

## 2019-07-25 DIAGNOSIS — M810 Age-related osteoporosis without current pathological fracture: Secondary | ICD-10-CM | POA: Diagnosis not present

## 2019-07-25 DIAGNOSIS — Z23 Encounter for immunization: Secondary | ICD-10-CM | POA: Diagnosis not present

## 2019-07-25 DIAGNOSIS — I10 Essential (primary) hypertension: Secondary | ICD-10-CM | POA: Diagnosis not present

## 2019-08-19 ENCOUNTER — Emergency Department (HOSPITAL_BASED_OUTPATIENT_CLINIC_OR_DEPARTMENT_OTHER)
Admission: EM | Admit: 2019-08-19 | Discharge: 2019-08-19 | Disposition: A | Discharge status: Home / Self Care | Payer: Medicare HMO | Attending: Emergency Medicine | Admitting: Emergency Medicine

## 2019-08-19 ENCOUNTER — Emergency Department (HOSPITAL_BASED_OUTPATIENT_CLINIC_OR_DEPARTMENT_OTHER): Payer: Medicare HMO

## 2019-08-19 ENCOUNTER — Other Ambulatory Visit: Payer: Self-pay

## 2019-08-19 ENCOUNTER — Encounter (HOSPITAL_BASED_OUTPATIENT_CLINIC_OR_DEPARTMENT_OTHER): Payer: Self-pay | Admitting: Emergency Medicine

## 2019-08-19 DIAGNOSIS — Y929 Unspecified place or not applicable: Secondary | ICD-10-CM | POA: Insufficient documentation

## 2019-08-19 DIAGNOSIS — I10 Essential (primary) hypertension: Secondary | ICD-10-CM | POA: Insufficient documentation

## 2019-08-19 DIAGNOSIS — S42401A Unspecified fracture of lower end of right humerus, initial encounter for closed fracture: Secondary | ICD-10-CM | POA: Diagnosis not present

## 2019-08-19 DIAGNOSIS — Z888 Allergy status to other drugs, medicaments and biological substances status: Secondary | ICD-10-CM | POA: Insufficient documentation

## 2019-08-19 DIAGNOSIS — E119 Type 2 diabetes mellitus without complications: Secondary | ICD-10-CM | POA: Insufficient documentation

## 2019-08-19 DIAGNOSIS — W010XXA Fall on same level from slipping, tripping and stumbling without subsequent striking against object, initial encounter: Secondary | ICD-10-CM | POA: Diagnosis not present

## 2019-08-19 DIAGNOSIS — Z79899 Other long term (current) drug therapy: Secondary | ICD-10-CM | POA: Diagnosis not present

## 2019-08-19 DIAGNOSIS — Y9301 Activity, walking, marching and hiking: Secondary | ICD-10-CM | POA: Insufficient documentation

## 2019-08-19 DIAGNOSIS — S42491A Other displaced fracture of lower end of right humerus, initial encounter for closed fracture: Secondary | ICD-10-CM

## 2019-08-19 DIAGNOSIS — Z7982 Long term (current) use of aspirin: Secondary | ICD-10-CM | POA: Diagnosis not present

## 2019-08-19 DIAGNOSIS — S0990XA Unspecified injury of head, initial encounter: Secondary | ICD-10-CM | POA: Insufficient documentation

## 2019-08-19 DIAGNOSIS — Y999 Unspecified external cause status: Secondary | ICD-10-CM | POA: Diagnosis not present

## 2019-08-19 DIAGNOSIS — S59901A Unspecified injury of right elbow, initial encounter: Secondary | ICD-10-CM | POA: Diagnosis present

## 2019-08-19 DIAGNOSIS — Z7984 Long term (current) use of oral hypoglycemic drugs: Secondary | ICD-10-CM | POA: Insufficient documentation

## 2019-08-19 DIAGNOSIS — W19XXXA Unspecified fall, initial encounter: Secondary | ICD-10-CM

## 2019-08-19 LAB — CBG MONITORING, ED: Glucose-Capillary: 125 mg/dL — ABNORMAL HIGH (ref 70–99)

## 2019-08-19 MED ORDER — OXYCODONE-ACETAMINOPHEN 5-325 MG PO TABS: 1.0000 | ORAL_TABLET | Freq: Four times a day (QID) | ORAL | 0 refills | Status: DC | PRN

## 2019-08-19 MED ORDER — OXYCODONE-ACETAMINOPHEN 5-325 MG PO TABS
2.0000 | ORAL_TABLET | Freq: Once | ORAL | Status: AC
  Administered 2019-08-19: 2 via ORAL
  Filled 2019-08-19: qty 2

## 2019-08-19 NOTE — ED Notes (Signed)
Checked BS. Pt states they are diabetic and just wanted it checked. Pt alert, skin warm and dry.

## 2019-08-19 NOTE — ED Provider Notes (Signed)
[facility] EMERGENCY DEPARTMENT Provider Note   CSN: [id number] Arrival date & time: 08/19/19  1512     History Chief Complaint  Patient presents with   [facility] is a 79 y.o. female hx of DM, HTN, here with fall.  Patient was walking and accidentally tripped on the carpet and landed on the right elbow.  Patient did also hit her left forehead as well.  Patient noticed swelling of the right elbow.  No meds prior to arrival.  Patient is not on any blood thinners.  Patient states that she had previous knee surgery with Dr. Erlinda Hong and wants to stay in the practice.  The history is provided by the patient.       Past Medical History:  Diagnosis Date   Arthritis    Cancer ([place])    Intermetrial CA   Depression    Diabetes mellitus without complication ([place])    Type II   Dyspnea    due to pain   Glaucoma    Hypertension     Patient Active Problem List   Diagnosis Date Noted   Abnormal EKG 08/10/2016   Diabetes mellitus with coincident hypertension ([place]) 08/10/2016   History of open reduction and internal fixation (ORIF) procedure 07/07/2016   Displaced transverse fracture of right patella, initial encounter for closed fracture 07/04/2016    Past Surgical History:  Procedure Laterality Date   ABDOMINAL HYSTERECTOMY     CERVICAL FUSION  2007   EYE SURGERY Bilateral    ORIF PATELLA Right 07/07/2016   Procedure: Partial Patellectomy;  Surgeon: Leandrew Koyanagi, MD;  Location: [facility];  Service: Orthopedics;  Laterality: Right;     OB History   No obstetric history on file.     Family History  Problem Relation Age of Onset   CVA Mother    Heart disease Father 48   Heart attack Sister    Heart disease Sister    Arrhythmia Sister    CVA Sister     Social History   Tobacco Use   Smoking status: Never Smoker   Smokeless tobacco: Never Used  Substance Use Topics   Alcohol use: No   Drug use: No    Home  Medications Prior to Admission medications   Medication Sig Start Date End Date Taking? Authorizing Provider  aspirin EC 325 MG tablet Take 1 tablet (325 mg total) by mouth daily. 07/07/16   Leandrew Koyanagi, MD  atorvastatin (LIPITOR) 40 MG tablet Take 40 mg by mouth daily.  05/16/16   [provider]  DORZOLAMIDE HCL OP Place 1 drop into both eyes 2 (two) times daily.    [provider]  hydrochlorothiazide (HYDRODIURIL) 25 MG tablet Take 25 mg by mouth daily.     [provider]  insulin NPH-regular Human (NOVOLIN 70/30) (70-30) 100 UNIT/ML injection Inject 30 Units into the skin 2 (two) times daily with a meal.    [provider]  latanoprost (XALATAN) 0.005 % ophthalmic solution Place 1 drop into both eyes at bedtime.  06/15/16   [provider]  metFORMIN (GLUCOPHAGE) 500 MG tablet Take 500 mg by mouth 2 (two) times daily with a meal.    [provider]  methocarbamol (ROBAXIN) 750 MG tablet Take 1 tablet (750 mg total) by mouth 2 (two) times daily as needed for muscle spasms. Patient not taking: Reported on 08/17/2016 07/07/16   Leandrew Koyanagi, MD  metoprolol (LOPRESSOR) 100 MG  tablet Take 100 mg by mouth 2 (two) times daily.    [provider]  oxyCODONE-acetaminophen (PERCOCET) 5-325 MG tablet Take 1-2 tablets by mouth every 4 (four) hours as needed for severe pain. Patient not taking: Reported on 08/17/2016 07/07/16   Leandrew Koyanagi, MD  ramipril (ALTACE) 10 MG capsule Take 20 mg by mouth daily.  05/16/16   [provider]    Allergies    Actos [pioglitazone]  Review of Systems   Review of Systems  Musculoskeletal:       R elbow pain   All other systems reviewed and are negative.   Physical Exam Updated Vital Signs BP (!) 178/86 (BP Location: Left Arm)    Pulse 78    Temp 99 F (37.2 C) (Oral)    Resp 20    Ht 5\' 4"  (1.626 m)    Wt 97.1 kg    SpO2 100%    BMI 36.73 kg/m   Physical Exam Vitals and nursing note  reviewed.  Constitutional:      Appearance: Normal appearance.  HENT:     Head:     Comments: Abrasion L forehead     Nose: Nose normal.     Mouth/Throat:     Mouth: Mucous membranes are moist.  Eyes:     Extraocular Movements: Extraocular movements intact.     Pupils: Pupils are equal, round, and reactive to light.  Neck:     Comments: No midline tenderness  Cardiovascular:     Rate and Rhythm: Normal rate and regular rhythm.     Pulses: Normal pulses.     Heart sounds: Normal heart sounds.  Pulmonary:     Effort: Pulmonary effort is normal.     Breath sounds: Normal breath sounds.  Abdominal:     General: Abdomen is flat.     Palpations: Abdomen is soft.  Musculoskeletal:     Comments: Swelling above R elbow, unable to range the elbow, no shoulder tenderness or deformity, nl ROM R wrist, 2+ radial pulses, nl hand grasp. No midline spinal tenderness, no other extremity trauma   Skin:    General: Skin is warm.     Capillary Refill: Capillary refill takes less than 2 seconds.  Neurological:     General: No focal deficit present.     Mental Status: She is alert.  Psychiatric:        Mood and Affect: Mood normal.        Behavior: Behavior normal.     ED Results / Procedures / Treatments   Labs (all labs ordered are listed, but only abnormal results are displayed) Labs Reviewed - No data to display  EKG None  Radiology DG Elbow 2 Views Right  Result Date: 08/19/2019 CLINICAL DATA:  Right elbow pain after a fall today. Initial encounter. EXAM: RIGHT ELBOW - 2 VIEW COMPARISON:  None. FINDINGS: The patient has an acute, simple transverse fracture of the distal humerus. The fracture extends from the superior aspect of the lateral epicondyle through the medial metaphysis just superior to the medial epicondyle. There is medial displacement of the distal fragment of approximately 0.7 cm and the fracture appears impacted. The articular surface does not appear disrupted. The elbow  is located. Associated soft tissue swelling noted. IMPRESSION: Acute transverse distal humerus fracture as described. Electronically Signed   By: Inge Rise M.D.   On: 08/19/2019 16:04    Procedures Procedures (including critical care time)  Medications Ordered in ED Medications  oxyCODONE-acetaminophen (PERCOCET/ROXICET) 5-325 MG per tablet 2 tablet (has no administration in time range)    ED Course  I have reviewed the triage vital signs and the nursing notes.  Pertinent labs & imaging results that were available during my care of the patient were reviewed by me and considered in my medical decision making (see chart for details).    MDM Rules/Calculators/A&P                      [facility] is a 79 y.o. female here with fall.  Has obvious swelling to the right elbow.  Concern for possible elbow fracture.  She also had left forehead injury as well.  Will get CT head and neck and right elbow x-rays.  Will give pain medicine.   6:16 PM X-rays showed distal humerus fracture.  I talked to her orthopedic doctor, Dr. Erlinda Hong.  He requested a CT of the elbow which also confirmed the distal humerus fracture.  Posterior elbow splint was placed by the staff.  Will discharge home with Percocet and she will follow up in the office this week to plan for possible surgery.  Final Clinical Impression(s) / ED Diagnoses Final diagnoses:  Fall    Rx / DC Orders ED Discharge Orders    None       Drenda Freeze, MD 08/19/19 [phone]

## 2019-08-19 NOTE — ED Notes (Signed)
Ice Pack applied to Right elbow

## 2019-08-19 NOTE — ED Triage Notes (Signed)
Tripped on carpet 1 hour pta. , right elbow pain and hit left forehead, denies LOC. Small abrasion left forehead, mild swelling right elbow. PT ambulatory, alert, not on blood thinners.

## 2019-08-19 NOTE — Discharge Instructions (Signed)
You have an elbow fracture.  Please keep the splint on until you see Dr. Erlinda Hong this week  Apply ice for swelling.  Take Motrin for pain and take Percocet for severe pain.  Return to ER if you have worse pain, fingers turning numb or blue.

## 2019-08-20 ENCOUNTER — Encounter: Payer: Self-pay | Admitting: Orthopaedic Surgery

## 2019-08-20 ENCOUNTER — Other Ambulatory Visit: Payer: Self-pay | Admitting: Family

## 2019-08-20 ENCOUNTER — Ambulatory Visit: Payer: Medicare HMO | Admitting: Orthopaedic Surgery

## 2019-08-20 ENCOUNTER — Other Ambulatory Visit: Payer: Self-pay

## 2019-08-20 DIAGNOSIS — S42411A Displaced simple supracondylar fracture without intercondylar fracture of right humerus, initial encounter for closed fracture: Secondary | ICD-10-CM

## 2019-08-20 NOTE — Progress Notes (Signed)
Office Visit Note   Patient: [facility]           Date of Birth: March 07, 1941           MRN: [id number] Visit Date: 08/20/2019              Requested by: Aretta Nip, [place],  [place] [id number] PCP: Aretta Nip, MD   Assessment & Plan: Visit Diagnoses:  1. Right supracondylar humerus fracture, closed, initial encounter     Plan: My impression is displaced right supracondylar humerus fracture without intra-articular component.  I reviewed the x-rays and CT scan with the patient detail.  Given the fact that she lives alone and this is her dominant arm and that she is highly independent and wishes to remain this way she would benefit from surgical repair and stabilization to allow pain relief and early mobilization.  She is aware of the potential risks of infection, neurovascular injury, stiffness, and hardware irritation.  She would like to go ahead and move forward with surgery early next week.  Long-arm splint was reapplied today.  Follow-Up Instructions: Return if symptoms worsen or fail to improve.   Orders:  No orders of the defined types were placed in this encounter.  No orders of the defined types were placed in this encounter.     Procedures: No procedures performed   Clinical Data: No additional findings.   Subjective: Chief Complaint  Patient presents with  . Right Elbow - Pain    [facility] is a 79 year old female who is a longtime patient of mine.  I took care of her about 3 years ago for a patella fracture.  She sustained a right distal humerus fracture yesterday after tripping over a rug.  She has well controlled diabetes.  She was placed in a long-arm splint.  Not taking any pain medications.  She has some difficulty sleeping due to the discomfort.  Denies any numbness and tingling.  She is right-hand dominant and is very independent and lives alone and is very eager to remain independent.   Review of Systems    Constitutional: Negative.   HENT: Negative.   Eyes: Negative.   Respiratory: Negative.   Cardiovascular: Negative.   Endocrine: Negative.   Musculoskeletal: Negative.   Neurological: Negative.   Hematological: Negative.   Psychiatric/Behavioral: Negative.   All other systems reviewed and are negative.    Objective: Vital Signs: There were no vitals taken for this visit.  Physical Exam Vitals and nursing note reviewed.  Constitutional:      Appearance: She is well-developed.  HENT:     Head: Normocephalic and atraumatic.  Pulmonary:     Effort: Pulmonary effort is normal.  Abdominal:     Palpations: Abdomen is soft.  Musculoskeletal:     Cervical back: Neck supple.  Skin:    General: Skin is warm.     Capillary Refill: Capillary refill takes less than 2 seconds.  Neurological:     Mental Status: She is alert and oriented to person, place, and time.  Psychiatric:        Behavior: Behavior normal.        Thought Content: Thought content normal.        Judgment: Judgment normal.     Ortho Exam Right arm and elbow shows moderate swelling and bruising.  No skin compromise.  Neurovascularly intact distally.  Range of motion not tested Specialty Comments:  No specialty comments available.  Imaging:  CT Head Wo Contrast  Result Date: 08/19/2019 CLINICAL DATA:  Fall.  Head trauma.  Headache. EXAM: CT HEAD WITHOUT CONTRAST TECHNIQUE: Contiguous axial images were obtained from the base of the skull through the vertex without intravenous contrast. COMPARISON:  None. FINDINGS: Brain: No evidence of acute infarction, hemorrhage, hydrocephalus, extra-axial collection or mass lesion/mass effect. Mild parietal atrophy bilaterally Vascular: Negative for hyperdense vessel Skull: Negative Sinuses/Orbits: Mucosal edema and bony thickening left maxillary sinus. Remaining sinuses clear. Bilateral cataract extraction. Other: None IMPRESSION: No acute abnormality. Electronically Signed   By:  Franchot Gallo M.D.   On: 08/19/2019 17:01   CT Elbow Right Wo Contrast  Result Date: 08/19/2019 CLINICAL DATA:  Elbow pain, fracture EXAM: CT OF THE UPPER RIGHT EXTREMITY WITHOUT CONTRAST TECHNIQUE: Multidetector CT imaging of the upper right extremity was performed according to the standard protocol. COMPARISON:  Plain films today FINDINGS: As seen and described on earlier plain films, there is a distal right humeral fracture. The distal fragment is displaced anteriorly relative to the humeral shaft with overlapping fragments. No subluxation or dislocation. IMPRESSION: Displaced distal right humeral fracture with anterior displacement of the distal fracture fragment. Electronically Signed   By: Rolm Baptise M.D.   On: 08/19/2019 18:02     PMFS History: Patient Active Problem List   Diagnosis Date Noted  . Right supracondylar humerus fracture, closed, initial encounter 08/20/2019  . Abnormal EKG 08/10/2016  . Diabetes mellitus with coincident hypertension ([place]) 08/10/2016  . History of open reduction and internal fixation (ORIF) procedure 07/07/2016  . Displaced transverse fracture of right patella, initial encounter for closed fracture 07/04/2016   Past Medical History:  Diagnosis Date  . Arthritis   . Cancer ([place])    Intermetrial CA  . Depression   . Diabetes mellitus without complication ([place])    Type II  . Dyspnea    due to pain  . Glaucoma   . Hypertension     Family History  Problem Relation Age of Onset  . CVA Mother   . Heart disease Father 83  . Heart attack Sister   . Heart disease Sister   . Arrhythmia Sister   . CVA Sister     Past Surgical History:  Procedure Laterality Date  . ABDOMINAL HYSTERECTOMY    . CERVICAL FUSION  2007  . EYE SURGERY Bilateral   . ORIF PATELLA Right 07/07/2016   Procedure: Partial Patellectomy;  Surgeon: Leandrew Koyanagi, MD;  Location: [facility];  Service: Orthopedics;  Laterality: Right;   Social History   Occupational History  . Not  on file  Tobacco Use  . Smoking status: Never Smoker  . Smokeless tobacco: Never Used  Substance and Sexual Activity  . Alcohol use: No  . Drug use: No  . Sexual activity: Not on file

## 2019-08-22 ENCOUNTER — Encounter (HOSPITAL_COMMUNITY): Payer: Self-pay | Admitting: Orthopaedic Surgery

## 2019-08-22 ENCOUNTER — Other Ambulatory Visit (HOSPITAL_COMMUNITY)
Admission: RE | Admit: 2019-08-22 | Discharge: 2019-08-22 | Disposition: A | Discharge status: Home / Self Care | Payer: Medicare HMO | Source: Ambulatory Visit | Attending: Orthopaedic Surgery | Admitting: Orthopaedic Surgery

## 2019-08-22 DIAGNOSIS — Z20822 Contact with and (suspected) exposure to covid-19: Secondary | ICD-10-CM | POA: Insufficient documentation

## 2019-08-22 DIAGNOSIS — Z01812 Encounter for preprocedural laboratory examination: Secondary | ICD-10-CM | POA: Insufficient documentation

## 2019-08-22 LAB — SARS CORONAVIRUS 2 (TAT 6-24 HRS): SARS Coronavirus 2: NEGATIVE

## 2019-08-22 NOTE — Progress Notes (Signed)
Patient denies shortness of breath, fever, cough or chest pain.  PCP - Dr Milagros Evener Diabetes Mgmt - Dr Delrae Rend  Cardiologist - n/a  Chest x-ray - n/a EKG - dos 08/25/19 Stress Test -  ECHO - 07/21/16 Cardiac Cath - n/a  Fasting Blood Sugar - unknown Checks Blood Sugar  1 time at night  . Do not take oral diabetes medicines (metformin) the morning of surgery.  . THE NIGHT BEFORE SURGERY, take 21 units Novolin 70/30 mix Insulin.     THE MORNING OF SURGERY, do not take any Novolin 70/30 insulin.  . If your blood sugar is less than 70 mg/dL, you will need to treat for low blood sugar: o Treat a low blood sugar (less than 70 mg/dL) with  cup of clear juice (cranberry or apple), 4 glucose tablets, OR glucose gel. o Recheck blood sugar in 15 minutes after treatment (to make sure it is greater than 70 mg/dL). If your blood sugar is not greater than 70 mg/dL on recheck, call [phone] for further instructions.  Aspirin Instructions: Follow your surgeon's instructions on when to stop aspirin prior to surgery,  If no instructions were given by your surgeon then you will need to call the office for those instructions.  ERAS: Clears til 12:30 pm, no drink.  STOP now taking any Aspirin (unless otherwise instructed by your surgeon), Aleve, Naproxen, Ibuprofen, Motrin, Advil, Goody's, BC's, all herbal medications, fish oil, and all vitamins.   Coronavirus Screening Covid test on 08/22/19 is pending. Do you have any of the following symptoms:  Cough yes/no: No Fever (>100.71F)  yes/no: No Runny nose yes/no: No Sore throat yes/no: No Difficulty breathing/shortness of breath  yes/no: No  Have you traveled in the last 14 days and where? yes/no: No  Patient verbalized understanding of instructions that were given via phone.

## 2019-08-25 ENCOUNTER — Ambulatory Visit (HOSPITAL_COMMUNITY)
Admission: RE | Admit: 2019-08-25 | Discharge: 2019-08-25 | Disposition: A | Discharge status: Home / Self Care | Payer: Medicare HMO | Attending: Orthopaedic Surgery | Admitting: Orthopaedic Surgery

## 2019-08-25 ENCOUNTER — Other Ambulatory Visit: Payer: Self-pay

## 2019-08-25 ENCOUNTER — Encounter (HOSPITAL_COMMUNITY)
Admission: RE | Disposition: A | Discharge status: Home / Self Care | Payer: Medicare HMO | Source: Home / Self Care | Attending: Orthopaedic Surgery

## 2019-08-25 ENCOUNTER — Ambulatory Visit (HOSPITAL_COMMUNITY): Payer: Medicare HMO | Admitting: Anesthesiology

## 2019-08-25 ENCOUNTER — Encounter (HOSPITAL_COMMUNITY): Payer: Self-pay | Admitting: Orthopaedic Surgery

## 2019-08-25 DIAGNOSIS — E785 Hyperlipidemia, unspecified: Secondary | ICD-10-CM | POA: Diagnosis not present

## 2019-08-25 DIAGNOSIS — I10 Essential (primary) hypertension: Secondary | ICD-10-CM | POA: Diagnosis not present

## 2019-08-25 DIAGNOSIS — Z794 Long term (current) use of insulin: Secondary | ICD-10-CM | POA: Diagnosis not present

## 2019-08-25 DIAGNOSIS — H409 Unspecified glaucoma: Secondary | ICD-10-CM | POA: Diagnosis not present

## 2019-08-25 DIAGNOSIS — Z7982 Long term (current) use of aspirin: Secondary | ICD-10-CM | POA: Insufficient documentation

## 2019-08-25 DIAGNOSIS — S42411A Displaced simple supracondylar fracture without intercondylar fracture of right humerus, initial encounter for closed fracture: Secondary | ICD-10-CM | POA: Diagnosis not present

## 2019-08-25 DIAGNOSIS — E119 Type 2 diabetes mellitus without complications: Secondary | ICD-10-CM | POA: Diagnosis not present

## 2019-08-25 DIAGNOSIS — X58XXXA Exposure to other specified factors, initial encounter: Secondary | ICD-10-CM | POA: Insufficient documentation

## 2019-08-25 DIAGNOSIS — Z7983 Long term (current) use of bisphosphonates: Secondary | ICD-10-CM | POA: Insufficient documentation

## 2019-08-25 DIAGNOSIS — G8918 Other acute postprocedural pain: Secondary | ICD-10-CM | POA: Diagnosis not present

## 2019-08-25 DIAGNOSIS — Z79899 Other long term (current) drug therapy: Secondary | ICD-10-CM | POA: Insufficient documentation

## 2019-08-25 HISTORY — DX: Hyperlipidemia, unspecified: E78.5

## 2019-08-25 HISTORY — DX: Presence of dental prosthetic device (complete) (partial): Z97.2

## 2019-08-25 HISTORY — PX: ORIF HUMERUS FRACTURE: SHX2126

## 2019-08-25 HISTORY — DX: Dependence on other enabling machines and devices: Z99.89

## 2019-08-25 LAB — POCT I-STAT, CHEM 8
BUN: 15 mg/dL (ref 8–23)
Calcium, Ion: 0.86 mmol/L — CL (ref 1.15–1.40)
Chloride: 99 mmol/L (ref 98–111)
Creatinine, Ser: 0.6 mg/dL (ref 0.44–1.00)
Glucose, Bld: 217 mg/dL — ABNORMAL HIGH (ref 70–99)
HCT: 41 % (ref 36.0–46.0)
Hemoglobin: 13.9 g/dL (ref 12.0–15.0)
Potassium: 3.8 mmol/L (ref 3.5–5.1)
Sodium: 125 mmol/L — ABNORMAL LOW (ref 135–145)
TCO2: 23 mmol/L (ref 22–32)

## 2019-08-25 LAB — GLUCOSE, CAPILLARY
Glucose-Capillary: 208 mg/dL — ABNORMAL HIGH (ref 70–99)
Glucose-Capillary: 187 mg/dL — ABNORMAL HIGH (ref 70–99)

## 2019-08-25 SURGERY — OPEN REDUCTION INTERNAL FIXATION (ORIF) DISTAL HUMERUS FRACTURE
Anesthesia: Regional | Site: Elbow | Laterality: Right

## 2019-08-25 MED ORDER — PHENYLEPHRINE HCL (PRESSORS) 10 MG/ML IV SOLN: INTRAVENOUS | Status: DC | PRN

## 2019-08-25 MED ORDER — FENTANYL CITRATE (PF) 100 MCG/2ML IJ SOLN
INTRAMUSCULAR | Status: AC
  Administered 2019-08-25: 50 ug via INTRAVENOUS
  Filled 2019-08-25: qty 2

## 2019-08-25 MED ORDER — METHOCARBAMOL 750 MG PO TABS
750.0000 mg | ORAL_TABLET | Freq: Two times a day (BID) | ORAL | 3 refills | Status: DC | PRN
Start: 2019-08-25 — End: 2020-07-07

## 2019-08-25 MED ORDER — FENTANYL CITRATE (PF) 100 MCG/2ML IJ SOLN: 25.0000 ug | INTRAMUSCULAR | Status: DC | PRN

## 2019-08-25 MED ORDER — PHENYLEPHRINE HCL-NACL 10-0.9 MG/250ML-% IV SOLN
INTRAVENOUS | Status: DC | PRN
  Administered 2019-08-25: 25 ug/min via INTRAVENOUS

## 2019-08-25 MED ORDER — FENTANYL CITRATE (PF) 250 MCG/5ML IJ SOLN
INTRAMUSCULAR | Status: AC
  Filled 2019-08-25: qty 5

## 2019-08-25 MED ORDER — CALCIUM CARBONATE-VITAMIN D 500-200 MG-UNIT PO TABS
1.0000 | ORAL_TABLET | Freq: Three times a day (TID) | ORAL | 6 refills | Status: DC
Start: 2019-08-25 — End: 2019-09-16

## 2019-08-25 MED ORDER — ONDANSETRON HCL 4 MG/2ML IJ SOLN
INTRAMUSCULAR | Status: DC | PRN
  Administered 2019-08-25: 4 mg via INTRAVENOUS

## 2019-08-25 MED ORDER — LIDOCAINE 2% (20 MG/ML) 5 ML SYRINGE
INTRAMUSCULAR | Status: DC | PRN
  Administered 2019-08-25: 20 mg via INTRAVENOUS

## 2019-08-25 MED ORDER — FENTANYL CITRATE (PF) 100 MCG/2ML IJ SOLN: 50.0000 ug | Freq: Once | INTRAMUSCULAR | Status: AC

## 2019-08-25 MED ORDER — FENTANYL CITRATE (PF) 250 MCG/5ML IJ SOLN
INTRAMUSCULAR | Status: DC | PRN
  Administered 2019-08-25: 50 ug via INTRAVENOUS

## 2019-08-25 MED ORDER — PHENYLEPHRINE HCL (PRESSORS) 10 MG/ML IV SOLN
INTRAVENOUS | Status: DC | PRN
  Administered 2019-08-25 (×2): 120 ug via INTRAVENOUS

## 2019-08-25 MED ORDER — PROPOFOL 10 MG/ML IV BOLUS
INTRAVENOUS | Status: AC
  Filled 2019-08-25: qty 20

## 2019-08-25 MED ORDER — CEFAZOLIN SODIUM-DEXTROSE 2-4 GM/100ML-% IV SOLN
2.0000 g | INTRAVENOUS | Status: AC
  Administered 2019-08-25: 2 g via INTRAVENOUS
  Filled 2019-08-25: qty 100

## 2019-08-25 MED ORDER — LACTATED RINGERS IV SOLN: INTRAVENOUS | Status: DC | PRN

## 2019-08-25 MED ORDER — PROPOFOL 10 MG/ML IV BOLUS
INTRAVENOUS | Status: DC | PRN
  Administered 2019-08-25: 130 mg via INTRAVENOUS

## 2019-08-25 MED ORDER — SUGAMMADEX SODIUM 200 MG/2ML IV SOLN: INTRAVENOUS | Status: DC | PRN

## 2019-08-25 MED ORDER — MIDAZOLAM HCL 2 MG/2ML IJ SOLN: 1.0000 mg | Freq: Once | INTRAMUSCULAR | Status: AC

## 2019-08-25 MED ORDER — BUPIVACAINE HCL (PF) 0.25 % IJ SOLN
INTRAMUSCULAR | Status: AC
  Filled 2019-08-25: qty 30

## 2019-08-25 MED ORDER — ACETAMINOPHEN 500 MG PO TABS
1000.0000 mg | ORAL_TABLET | Freq: Once | ORAL | Status: AC
  Administered 2019-08-25: 1000 mg via ORAL
  Filled 2019-08-25: qty 2

## 2019-08-25 MED ORDER — 0.9 % SODIUM CHLORIDE (POUR BTL) OPTIME
TOPICAL | Status: DC | PRN
  Administered 2019-08-25: 1000 mL

## 2019-08-25 MED ORDER — MIDAZOLAM HCL 2 MG/2ML IJ SOLN
INTRAMUSCULAR | Status: AC
  Administered 2019-08-25: 1 mg via INTRAVENOUS
  Filled 2019-08-25: qty 2

## 2019-08-25 MED ORDER — SUCCINYLCHOLINE CHLORIDE 200 MG/10ML IV SOSY
PREFILLED_SYRINGE | INTRAVENOUS | Status: DC | PRN
  Administered 2019-08-25: 200 mg via INTRAVENOUS

## 2019-08-25 MED ORDER — OXYCODONE-ACETAMINOPHEN 5-325 MG PO TABS: 1.0000 | ORAL_TABLET | Freq: Three times a day (TID) | ORAL | 0 refills | Status: DC | PRN

## 2019-08-25 SURGICAL SUPPLY — 78 items
BIT DRILL 4.8X200 CANN (BIT) ×1 IMPLANT
BIT DRILL CALIBRATED 2.7 (BIT) ×1 IMPLANT
BLADE AVERAGE 25X9 (BLADE) ×1 IMPLANT
BNDG CMPR 9X4 STRL LF SNTH (GAUZE/BANDAGES/DRESSINGS)
BNDG ELASTIC 4X5.8 VLCR STR LF (GAUZE/BANDAGES/DRESSINGS) ×1 IMPLANT
BNDG ESMARK 4X9 LF (GAUZE/BANDAGES/DRESSINGS) IMPLANT
CANISTER SUCT 3000ML PPV (MISCELLANEOUS) IMPLANT
COVER SURGICAL LIGHT HANDLE (MISCELLANEOUS) ×2 IMPLANT
COVER WAND RF STERILE (DRAPES) ×2 IMPLANT
CUFF TOURN SGL QUICK 18X4 (TOURNIQUET CUFF) ×2 IMPLANT
CUFF TOURN SGL QUICK 24 (TOURNIQUET CUFF)
CUFF TRNQT CYL 24X4X16.5-23 (TOURNIQUET CUFF) IMPLANT
DRAPE C-ARM 42X72 X-RAY (DRAPES) ×2 IMPLANT
DRAPE IMP U-DRAPE 54X76 (DRAPES) ×2 IMPLANT
DRAPE INCISE IOBAN 66X45 STRL (DRAPES) ×2 IMPLANT
DRAPE U-SHAPE 47X51 STRL (DRAPES) ×2 IMPLANT
DRSG TEGADERM 4X4.75 (GAUZE/BANDAGES/DRESSINGS) ×8 IMPLANT
ELECT CAUTERY BLADE 6.4 (BLADE) ×2 IMPLANT
ELECT REM PT RETURN 9FT ADLT (ELECTROSURGICAL) ×2
ELECTRODE REM PT RTRN 9FT ADLT (ELECTROSURGICAL) ×1 IMPLANT
FACESHIELD WRAPAROUND (MASK) ×2 IMPLANT
FACESHIELD WRAPAROUND OR TEAM (MASK) ×1 IMPLANT
GAUZE SPONGE 4X4 12PLY STRL (GAUZE/BANDAGES/DRESSINGS) ×2 IMPLANT
GAUZE XEROFORM 1X8 LF (GAUZE/BANDAGES/DRESSINGS) ×2 IMPLANT
GAUZE XEROFORM 5X9 LF (GAUZE/BANDAGES/DRESSINGS) ×2 IMPLANT
GLOVE BIOGEL PI IND STRL 7.0 (GLOVE) ×1 IMPLANT
GLOVE BIOGEL PI INDICATOR 7.0 (GLOVE) ×1
GLOVE ECLIPSE 7.0 STRL STRAW (GLOVE) ×2 IMPLANT
GLOVE SKINSENSE NS SZ7.5 (GLOVE) ×4
GLOVE SKINSENSE STRL SZ7.5 (GLOVE) ×4 IMPLANT
GOWN STRL REIN XL XLG (GOWN DISPOSABLE) ×4 IMPLANT
K-WIRE FIXATION 2.0X6 (WIRE) ×6
KIT BASIN OR (CUSTOM PROCEDURE TRAY) ×2 IMPLANT
KIT TURNOVER KIT B (KITS) ×2 IMPLANT
KWIRE FIXATION 2.0X6 (WIRE) IMPLANT
MANIFOLD NEPTUNE II (INSTRUMENTS) ×2 IMPLANT
NS IRRIG 1000ML POUR BTL (IV SOLUTION) ×2 IMPLANT
PACK SHOULDER (CUSTOM PROCEDURE TRAY) ×2 IMPLANT
PACK UNIVERSAL I (CUSTOM PROCEDURE TRAY) ×2 IMPLANT
PAD ABD 8X10 STRL (GAUZE/BANDAGES/DRESSINGS) ×1 IMPLANT
PAD ARMBOARD 7.5X6 YLW CONV (MISCELLANEOUS) ×4 IMPLANT
PAD CAST 4YDX4 CTTN HI CHSV (CAST SUPPLIES) ×1 IMPLANT
PADDING CAST ABS 4INX4YD NS (CAST SUPPLIES) ×1
PADDING CAST ABS COTTON 4X4 ST (CAST SUPPLIES) IMPLANT
PADDING CAST COTTON 4X4 STRL (CAST SUPPLIES) ×2
PIN GUIDE DRILL TIP 2.8X300 (DRILL) ×1 IMPLANT
PLATE LOCK RT SM (Plate) ×4 IMPLANT
PLATE LOCK RT SM 74X10.7X3.5X9 (Plate) IMPLANT
PLATE LOCK RT SM 88X10.9X2.5X9 (Plate) IMPLANT
SCREW CANN 6.5X120X40 (Screw) ×1 IMPLANT
SCREW CORTICAL LOW PROF 3.5X20 (Screw) ×1 IMPLANT
SCREW LOCK CORT STAR 3.5X12 (Screw) ×1 IMPLANT
SCREW LOCK CORT STAR 3.5X16 (Screw) ×2 IMPLANT
SCREW LOCK CORT STAR 3.5X20 (Screw) ×1 IMPLANT
SCREW LOCK CORT STAR 3.5X30 (Screw) ×1 IMPLANT
SCREW LOCK CORT STAR 3.5X56 (Screw) ×1 IMPLANT
SCREW LOW PROFILE 18MMX3.5MM (Screw) ×2 IMPLANT
SCREW LP NL T15 3.5X20 (Screw) ×2 IMPLANT
SCREW LP NL T15 3.5X22 (Screw) ×2 IMPLANT
SCREW T15 MD 3.5X32MM NS (Screw) ×2 IMPLANT
SLING ARM IMMOBILIZER LRG (SOFTGOODS) ×2 IMPLANT
SPONGE LAP 18X18 RF (DISPOSABLE) ×4 IMPLANT
STAPLER VISISTAT 35W (STAPLE) IMPLANT
STRIP CLOSURE SKIN 1/2X4 (GAUZE/BANDAGES/DRESSINGS) IMPLANT
SUCTION FRAZIER HANDLE 10FR (MISCELLANEOUS)
SUCTION TUBE FRAZIER 10FR DISP (MISCELLANEOUS) IMPLANT
SUT ETHILON 3 0 PS 1 (SUTURE) ×4 IMPLANT
SUT VIC AB 0 CT1 27 (SUTURE) ×2
SUT VIC AB 0 CT1 27XBRD ANBCTR (SUTURE) ×1 IMPLANT
SUT VIC AB 2-0 CT1 27 (SUTURE) ×2
SUT VIC AB 2-0 CT1 TAPERPNT 27 (SUTURE) ×1 IMPLANT
SYR CONTROL 10ML LL (SYRINGE) IMPLANT
TOWEL GREEN STERILE (TOWEL DISPOSABLE) ×2 IMPLANT
TOWEL GREEN STERILE FF (TOWEL DISPOSABLE) IMPLANT
UNDERPAD 30X36 HEAVY ABSORB (UNDERPADS AND DIAPERS) ×2 IMPLANT
WASHER 3.5MM (Orthopedic Implant) ×2 IMPLANT
WASHER FLAT 6.5MM (Washer) ×1 IMPLANT
WATER STERILE IRR 1000ML POUR (IV SOLUTION) ×2 IMPLANT

## 2019-08-25 NOTE — Anesthesia Preprocedure Evaluation (Addendum)
Anesthesia Evaluation  Patient identified by MRN, date of birth, ID band Patient awake    Reviewed: Allergy & Precautions, NPO status , Patient's Chart, lab work & pertinent test results, reviewed documented beta blocker date and time   Airway Mallampati: III  TM Distance: >3 FB Neck ROM: Full  Mouth opening: Limited Mouth Opening  Dental  (+) Edentulous Upper, Edentulous Lower   Pulmonary neg pulmonary ROS,    Pulmonary exam normal breath sounds clear to auscultation       Cardiovascular hypertension, Pt. on home beta blockers and Pt. on medications Normal cardiovascular exam Rhythm:Regular Rate:Normal  HLD  TTE 2018 EF 55-60%, valves ok, G1DD   Neuro/Psych PSYCHIATRIC DISORDERS Depression negative neurological ROS     GI/Hepatic negative GI ROS, Neg liver ROS,   Endo/Other  negative endocrine ROSdiabetes, Type 2, Oral Hypoglycemic Agents, Insulin Dependent  Renal/GU negative Renal ROS  negative genitourinary   Musculoskeletal  (+) Arthritis ,   Abdominal   Peds  Hematology negative hematology ROS (+)   Anesthesia Other Findings right supracondylar humerus fracture after fall  Reproductive/Obstetrics                           Anesthesia Physical Anesthesia Plan  ASA: III  Anesthesia Plan: Regional and General   Post-op Pain Management:  Regional for Post-op pain   Induction: Intravenous  PONV Risk Score and Plan: 3 and Propofol infusion, Treatment may vary due to age or medical condition and Ondansetron  Airway Management Planned: Oral ETT  Additional Equipment:   Intra-op Plan:   Post-operative Plan: Extubation in OR  Informed Consent: I have reviewed the patients History and Physical, chart, labs and discussed the procedure including the risks, benefits and alternatives for the proposed anesthesia with the patient or authorized representative who has indicated his/her  understanding and acceptance.     Dental advisory given  Plan Discussed with: CRNA  Anesthesia Plan Comments:        Anesthesia Quick Evaluation

## 2019-08-25 NOTE — Anesthesia Procedure Notes (Signed)
Procedure Name: Intubation Date/Time: 08/25/2019 2:50 PM Performed by: Clearnce Sorrel, CRNA Pre-anesthesia Checklist: Patient identified, Emergency Drugs available, Suction available, Patient being monitored and Timeout performed Patient Re-evaluated:Patient Re-evaluated prior to induction Oxygen Delivery Method: Circle system utilized Preoxygenation: Pre-oxygenation with 100% oxygen Induction Type: IV induction and Rapid sequence Laryngoscope Size: Mac and 3 Grade View: Grade I Tube type: Oral Tube size: 7.0 mm Number of attempts: 1 Airway Equipment and Method: Stylet Placement Confirmation: ETT inserted through vocal cords under direct vision,  positive ETCO2 and breath sounds checked- equal and bilateral Secured at: 23 cm

## 2019-08-25 NOTE — Transfer of Care (Signed)
Immediate Anesthesia Transfer of Care Note  Patient: Kayla Griffin  Procedure(s) Performed: OPEN REDUCTION INTERNAL FIXATION (ORIF) RIGHT SUPRACONDYLAR HUMERUS FRACTURE, NEUROLYSIS ULNAR NERVE (Right Elbow)  Patient Location: PACU  Anesthesia Type:General and Regional  Level of Consciousness: drowsy  Airway & Oxygen Therapy: Patient Spontanous Breathing and Patient connected to face mask oxygen  Post-op Assessment: Report given to RN and Post -op Vital signs reviewed and stable  Post vital signs: Reviewed and stable  Last Vitals:  Vitals Value Taken Time  BP 157/72 08/25/19 1715  Temp 36.9 C 08/25/19 1715  Pulse 67 08/25/19 1720  Resp 18 08/25/19 1720  SpO2 99 % 08/25/19 1720  Vitals shown include unvalidated device data.  Last Pain:  Vitals:   08/25/19 1255  TempSrc:   PainSc: 4       Patients Stated Pain Goal: 3 ([id number])  Complications: No apparent anesthesia complications

## 2019-08-25 NOTE — Discharge Instructions (Signed)

## 2019-08-25 NOTE — Op Note (Signed)
Date of Surgery: 08/25/2019  INDICATIONS: Ms. Fenton is a 79 y.o.-year-old female with a right supracondylar humerus fracture;  The patient did consent to the procedure after discussion of the risks and benefits.  PREOPERATIVE DIAGNOSIS: Right supracondylar humerus fracture without intracondylar extension  POSTOPERATIVE DIAGNOSIS: Same.  PROCEDURE:  1.  Open reduction internal fixation of right supracondylar humerus fracture without intercondylar extension 2.  Neurolysis of right ulnar nerve with anterior subcutaneous transposition 3.  Right olecranon osteotomy and open reduction internal fixation of right olecranon osteotomy  SURGEON: N. Eduard Roux, M.D.  ASSIST: none  ANESTHESIA:  general, regional block  IV FLUIDS AND URINE: See anesthesia.  ESTIMATED BLOOD LOSS: Minimal mL.  IMPLANTS: Biomet distal humerus plates  DRAINS: None  COMPLICATIONS: see description of procedure.  DESCRIPTION OF PROCEDURE: The patient was brought to the operating room.  The patient had been signed prior to the procedure and this was documented. The patient had the anesthesia placed by the anesthesiologist.  A time-out was performed to confirm that this was the correct patient, site, side and location. The patient did receive antibiotics prior to the incision and was re-dosed during the procedure as needed at indicated intervals.  The patient had the operative extremity prepped and draped in the standard surgical fashion.    A sterile tourniquet was placed up high on the upper arm.  An incision was made on the posterior aspect of the humerus and elbow.  Full-thickness flaps were elevated.  We first identified the ulnar nerve in the anatomic position between the medial head of the triceps and the intermuscular septum.  This was then traced down distally through the cubital tunnel which was released and the FCU fascia which was released.  This was then transposed anteriorly.  The triceps muscle was then  elevated off the posterior aspect of the distal humerus.  I then drilled a pin down the axis of the medullary canal of the proximal ulna for eventual fixation of the olecranon osteotomy.  I then performed an olecranon osteotomy using fluoroscopic guidance.  The triceps muscle along with the tip of the olecranon was then peeled off of the posterior aspect of the distal humerus.  The supracondylar fracture line was visualized.  There was no extension into the joint.  Reduction was then obtained using a large tenaculum and this was confirmed under fluoroscopy.  I then placed a medial plate at the appropriate position and provisionally fixed with 2 K wires.  Then placed a posterior lateral plate at the appropriate position.  Series of nonlocking and locking screws were used to contour the plate to the bone.  Each screw had excellent fixation and purchase.  Multidirectional screws were used to gain compression across the fracture through the posterior plate.  Once this was done we then placed additional nonlocking and locking screws through the medial plate.  I was able to place one homerun screw through the distal portion of the medial plate with excellent fixation.  Final x-rays were taken.  Approach withdrawal phenomenon was performed to confirm no intra-articular penetration of the screws.  The olecranon osteotomy was then repaired with a 120 mm partially-threaded screw.  This had excellent purchase and compression across the osteotomy site.  The surgical wound was then thoroughly irrigated and closed in layered fashion using 0 Vicryl, 2-0 Vicryl, 3-0 nylon.  Sterile dressings were applied.  Posterior arm splint was placed.  Patient tolerated procedure well had no immediate complications.  POSTOPERATIVE PLAN: Discharge home.  Nonweightbearing right upper extremity.  Follow-up in the office in 1 week to begin range of motion.  Azucena Cecil, MD 5:09 PM

## 2019-08-25 NOTE — H&P (Signed)
PREOPERATIVE H&P  Chief Complaint: right supracondylar humerus fracture  HPI: Kayla Griffin is a 79 y.o. female who presents for surgical treatment of right supracondylar humerus fracture.  She denies any changes in medical history.  Past Medical History:  Diagnosis Date  . Ambulates with cane   . Arthritis   . Cancer [facility])    Endometrial - hysterectomy  . Depression   . Diabetes mellitus without complication ([place])    Type II  . Dyspnea    currently with arm pain 08/22/19  . Glaucoma   . HLD (hyperlipidemia)   . Hypertension   . Wears dentures    Past Surgical History:  Procedure Laterality Date  . ABDOMINAL HYSTERECTOMY    . CERVICAL FUSION  2007  . EYE SURGERY Bilateral    cataracts  . ORIF PATELLA Right 07/07/2016   Procedure: Partial Patellectomy;  Surgeon: Leandrew Koyanagi, MD;  Location: [place];  Service: Orthopedics;  Laterality: Right;   Social History   Socioeconomic History  . Marital status: Single    Spouse name: Not on file  . Number of children: Not on file  . Years of education: Not on file  . Highest education level: Not on file  Occupational History  . Not on file  Tobacco Use  . Smoking status: Never Smoker  . Smokeless tobacco: Never Used  Substance and Sexual Activity  . Alcohol use: No  . Drug use: No  . Sexual activity: Not on file    Comment: Hysterectomy  Other Topics Concern  . Not on file  Social History Narrative  . Not on file   Social Determinants of Health   Financial Resource Strain:   . Difficulty of Paying Living Expenses:   Food Insecurity:   . Worried About Charity fundraiser in the Last Year:   . Arboriculturist in the Last Year:   Transportation Needs:   . Film/video editor (Medical):   Marland Kitchen Lack of Transportation (Non-Medical):   Physical Activity:   . Days of Exercise per Week:   . Minutes of Exercise per Session:   Stress:   . Feeling of Stress :   Social Connections:   . Frequency of Communication  with Friends and Family:   . Frequency of Social Gatherings with Friends and Family:   . Attends Religious Services:   . Active Member of Clubs or Organizations:   . Attends Archivist Meetings:   Marland Kitchen Marital Status:    Family History  Problem Relation Age of Onset  . CVA Mother   . Heart disease Father 55  . Heart attack Sister   . Heart disease Sister   . Arrhythmia Sister   . CVA Sister    Allergies  Allergen Reactions  . Actos [Pioglitazone] Swelling    SWELLING REACTION UNSPECIFIED    Prior to Admission medications   Medication Sig Start Date End Date Taking? Authorizing Provider  alendronate (FOSAMAX) 70 MG tablet Take 70 mg by mouth every Sunday. Take with a full glass of water on an empty stomach.   Yes [provider]  aspirin EC 325 MG tablet Take 1 tablet (325 mg total) by mouth daily. 07/07/16  Yes Leandrew Koyanagi, MD  atorvastatin (LIPITOR) 40 MG tablet Take 40 mg by mouth daily.  05/16/16  Yes [provider]  Calcium Carbonate-Vit D-Min (CALCIUM 1200 PO) Take 1,200 mg by mouth daily.   Yes [provider]  dorzolamide (TRUSOPT) 2 % ophthalmic solution Place 1 drop into both eyes 2 (two) times daily.    Yes [provider]  hydrochlorothiazide (HYDRODIURIL) 25 MG tablet Take 25 mg by mouth daily.    Yes [provider]  insulin NPH-regular Human (NOVOLIN 70/30) (70-30) 100 UNIT/ML injection Inject 30 Units into the skin 2 (two) times daily with a meal.   Yes [provider]  latanoprost (XALATAN) 0.005 % ophthalmic solution Place 1 drop into both eyes at bedtime.  06/15/16  Yes [provider]  metFORMIN (GLUCOPHAGE) 500 MG tablet Take 500 mg by mouth 2 (two) times daily with a meal.   Yes [provider]  metoprolol (LOPRESSOR) 100 MG tablet Take 100 mg by mouth 2 (two) times daily.   Yes [provider]  oxyCODONE-acetaminophen (PERCOCET) 5-325 MG tablet Take 1 tablet by mouth every 6  (six) hours as needed. Patient taking differently: Take 1 tablet by mouth every 6 (six) hours as needed for moderate pain.  08/19/19  Yes Drenda Freeze, MD  ramipril (ALTACE) 10 MG capsule Take 20 mg by mouth daily.  05/16/16  Yes [provider]     Positive ROS: All other systems have been reviewed and were otherwise negative with the exception of those mentioned in the HPI and as above.  Physical Exam: General: Alert, no acute distress Cardiovascular: No pedal edema Respiratory: No cyanosis, no use of accessory musculature GI: abdomen soft Skin: No lesions in the area of chief complaint Neurologic: Sensation intact distally Psychiatric: Patient is competent for consent with normal mood and affect Lymphatic: no lymphedema  MUSCULOSKELETAL: exam stable  Assessment: right supracondylar humerus fracture  Plan: Plan for Procedure(s): OPEN REDUCTION INTERNAL FIXATION (ORIF) RIGHT SUPRACONDYLAR HUMERUS FRACTURE, NEUROLYSIS ULNAR NERVE  The risks benefits and alternatives were discussed with the patient including but not limited to the risks of nonoperative treatment, versus surgical intervention including infection, bleeding, nerve injury,  blood clots, cardiopulmonary complications, morbidity, mortality, among others, and they were willing to proceed.   Preoperative templating of the joint replacement has been completed, documented, and submitted to the Operating Room personnel in order to optimize intra-operative equipment management.  Eduard Roux, MD   08/25/2019 9:05 AM

## 2019-08-27 MED ORDER — DEXAMETHASONE SODIUM PHOSPHATE 10 MG/ML IJ SOLN
INTRAMUSCULAR | Status: DC | PRN
  Administered 2019-08-25: 5 mg

## 2019-08-27 MED ORDER — ROPIVACAINE HCL 5 MG/ML IJ SOLN
INTRAMUSCULAR | Status: DC | PRN
  Administered 2019-08-25: 20 mL via PERINEURAL

## 2019-08-27 NOTE — Anesthesia Postprocedure Evaluation (Signed)
Anesthesia Post Note  Patient: Kayla Griffin  Procedure(s) Performed: OPEN REDUCTION INTERNAL FIXATION (ORIF) RIGHT SUPRACONDYLAR HUMERUS FRACTURE, NEUROLYSIS ULNAR NERVE (Right Elbow)     Patient location during evaluation: PACU Anesthesia Type: Regional and General Level of consciousness: awake and alert Pain management: pain level controlled Vital Signs Assessment: post-procedure vital signs reviewed and stable Respiratory status: spontaneous breathing, nonlabored ventilation, respiratory function stable and patient connected to nasal cannula oxygen Cardiovascular status: blood pressure returned to baseline and stable Postop Assessment: no apparent nausea or vomiting Anesthetic complications: no    Last Vitals:  Vitals:   08/25/19 1745 08/25/19 1755  BP: (!) 166/74 (!) 167/73  Pulse: 65 64  Resp: 20 18  Temp:  36.8 C  SpO2: 98% 97%    Last Pain:  Vitals:   08/25/19 1745  TempSrc:   PainSc: 0-No pain                 Emmajane Altamura L Lexine Jaspers

## 2019-08-27 NOTE — Anesthesia Procedure Notes (Signed)
Anesthesia Regional Block: Supraclavicular block   Pre-Anesthetic Checklist: ,, timeout performed, Correct Patient, Correct Site, Correct Laterality, Correct Procedure, Correct Position, site marked, Risks and benefits discussed,  Surgical consent,  Pre-op evaluation,  At surgeon's request and post-op pain management  Laterality: Right  Prep: Maximum Sterile Barrier Precautions used, chloraprep       Needles:  Injection technique: Single-shot  Needle Type: Echogenic Stimulator Needle     Needle Length: 9cm  Needle Gauge: 22     Additional Needles:   Procedures:,,,, ultrasound used (permanent image in chart),,,,  Narrative:  Start time: 08/25/2019 2:05 PM End time: 08/25/2019 2:15 PM Injection made incrementally with aspirations every 5 mL.  Performed by: Personally  Anesthesiologist: Freddrick March, MD  Additional Notes: Monitors applied. No increased pain on injection. No increased resistance to injection. Injection made in 5cc increments. Good needle visualization. Patient tolerated procedure well.

## 2019-09-01 ENCOUNTER — Encounter: Payer: Self-pay | Admitting: *Deleted

## 2019-09-02 ENCOUNTER — Ambulatory Visit (INDEPENDENT_AMBULATORY_CARE_PROVIDER_SITE_OTHER): Payer: Medicare HMO

## 2019-09-02 ENCOUNTER — Other Ambulatory Visit: Payer: Self-pay

## 2019-09-02 ENCOUNTER — Ambulatory Visit (INDEPENDENT_AMBULATORY_CARE_PROVIDER_SITE_OTHER): Payer: Medicare HMO | Admitting: Orthopaedic Surgery

## 2019-09-02 ENCOUNTER — Encounter: Payer: Self-pay | Admitting: Orthopaedic Surgery

## 2019-09-02 DIAGNOSIS — S42411A Displaced simple supracondylar fracture without intercondylar fracture of right humerus, initial encounter for closed fracture: Secondary | ICD-10-CM

## 2019-09-02 NOTE — Progress Notes (Signed)
   Post-Op Visit Note   Patient: Kayla Griffin           Date of Birth: Dec 08, 1940           MRN: [id number] Visit Date: 09/02/2019 PCP: Aretta Nip, MD   Assessment & Plan:  Chief Complaint:  Chief Complaint  Patient presents with  . Right Elbow - Routine Post Op   Visit Diagnoses:  1. Right supracondylar humerus fracture, closed, initial encounter     Plan: Kayla Griffin is a 1 week status post ORIF right supracondylar humerus fracture with olecranon osteotomy.  Overall she is doing well.  Not taking any pain medications.  Surgical incision is clean dry and intact.  Neurovascular intact distally.  This was covered today and we will begin gentle range of motion with OT.  Recheck next week for suture removal.  Follow-Up Instructions: Return in about 1 week (around 09/09/2019).   Orders:  Orders Placed This Encounter  Procedures  . XR Elbow 2 Views Right   No orders of the defined types were placed in this encounter.   Imaging: XR Elbow 2 Views Right  Result Date: 09/02/2019 Stable fixation alignment of distal humerus fracture and olecranon osteotomy   PMFS History: Patient Active Problem List   Diagnosis Date Noted  . Right supracondylar humerus fracture, closed, initial encounter 08/20/2019  . Abnormal EKG 08/10/2016  . Diabetes mellitus with coincident hypertension ([place]) 08/10/2016   Past Medical History:  Diagnosis Date  . Ambulates with cane   . Arthritis   . Cancer [facility])    Endometrial - hysterectomy  . Depression   . Diabetes mellitus without complication ([place])    Type II  . Dyspnea    currently with arm pain 08/22/19  . Glaucoma   . HLD (hyperlipidemia)   . Hypertension   . Wears dentures     Family History  Problem Relation Age of Onset  . CVA Mother   . Heart disease Father 36  . Heart attack Sister   . Heart disease Sister   . Arrhythmia Sister   . CVA Sister     Past Surgical History:  Procedure Laterality Date  . ABDOMINAL  HYSTERECTOMY    . CERVICAL FUSION  2007  . EYE SURGERY Bilateral    cataracts  . ORIF HUMERUS FRACTURE Right 08/25/2019   Procedure: OPEN REDUCTION INTERNAL FIXATION (ORIF) RIGHT SUPRACONDYLAR HUMERUS FRACTURE, NEUROLYSIS ULNAR NERVE;  Surgeon: Leandrew Koyanagi, MD;  Location: [place];  Service: Orthopedics;  Laterality: Right;  . ORIF PATELLA Right 07/07/2016   Procedure: Partial Patellectomy;  Surgeon: Leandrew Koyanagi, MD;  Location: [place];  Service: Orthopedics;  Laterality: Right;   Social History   Occupational History  . Not on file  Tobacco Use  . Smoking status: Never Smoker  . Smokeless tobacco: Never Used  Substance and Sexual Activity  . Alcohol use: No  . Drug use: No  . Sexual activity: Not on file    Comment: Hysterectomy

## 2019-09-03 ENCOUNTER — Telehealth: Payer: Self-pay | Admitting: Orthopaedic Surgery

## 2019-09-03 NOTE — Telephone Encounter (Signed)
yes

## 2019-09-03 NOTE — Telephone Encounter (Signed)
Kayla Griffin with [facility] called asking that we send the referral for the pt over to the [place] location (They take her insurance) and she also asked that we fax over any post op protocol.   Fax# [phone]

## 2019-09-03 NOTE — Telephone Encounter (Signed)
Okay to put order in.  

## 2019-09-03 NOTE — Telephone Encounter (Signed)
Patient called. She would like a referral for PT sent to [facility] on [address]

## 2019-09-05 ENCOUNTER — Other Ambulatory Visit: Payer: Self-pay

## 2019-09-05 DIAGNOSIS — S42411A Displaced simple supracondylar fracture without intercondylar fracture of right humerus, initial encounter for closed fracture: Secondary | ICD-10-CM

## 2019-09-05 NOTE — Telephone Encounter (Signed)
Order made faxed to [place] location to fax #[phone]

## 2019-09-11 ENCOUNTER — Ambulatory Visit (INDEPENDENT_AMBULATORY_CARE_PROVIDER_SITE_OTHER): Payer: Medicare HMO

## 2019-09-11 ENCOUNTER — Encounter: Payer: Self-pay | Admitting: Orthopaedic Surgery

## 2019-09-11 ENCOUNTER — Ambulatory Visit (INDEPENDENT_AMBULATORY_CARE_PROVIDER_SITE_OTHER): Payer: Medicare HMO | Admitting: Orthopaedic Surgery

## 2019-09-11 VITALS — Ht 64.0 in | Wt 214.0 lb

## 2019-09-11 DIAGNOSIS — S42411A Displaced simple supracondylar fracture without intercondylar fracture of right humerus, initial encounter for closed fracture: Secondary | ICD-10-CM | POA: Diagnosis not present

## 2019-09-11 MED ORDER — ONDANSETRON HCL 4 MG PO TABS: 4.0000 mg | ORAL_TABLET | Freq: Three times a day (TID) | ORAL | 0 refills | Status: DC | PRN

## 2019-09-11 MED ORDER — AMOXICILLIN-POT CLAVULANATE 875-125 MG PO TABS
1.0000 | ORAL_TABLET | Freq: Two times a day (BID) | ORAL | 0 refills | Status: DC
Start: 2019-09-11 — End: 2019-09-25

## 2019-09-11 NOTE — Progress Notes (Signed)
Post-Op Visit Note   Patient: Kayla Griffin           Date of Birth: 1940/08/25           MRN: [id number] Visit Date: 09/11/2019 PCP: Aretta Nip, MD   Assessment & Plan:  Chief Complaint:  Chief Complaint  Patient presents with  . Right Elbow - Follow-up    Right ORIF supracondylar fracture DOS 08-25-2019   Visit Diagnoses:  1. Right supracondylar humerus fracture, closed, initial encounter     Plan: [name] is here for her 2 week postop visit.  Doing well overall.  No constitutional symptoms.  Incision is healed but there is significant swelling and redness concerning for cellulitis.  There is no drainage.  No pain with ROM of the elbow.  We will begin gentle ROM at this time with OT.  I will place her on augmentin for the cellulitis and she will take probiotics as well.  I would like to recheck her in 2 weeks.    Follow-Up Instructions: Return in about 2 weeks (around 09/25/2019).   Orders:  Orders Placed This Encounter  Procedures  . XR Elbow 2 Views Right   Meds ordered this encounter  Medications  . amoxicillin-clavulanate (AUGMENTIN) 875-125 MG tablet    Sig: Take 1 tablet by mouth 2 (two) times daily.    Dispense:  14 tablet    Refill:  0  . ondansetron (ZOFRAN) 4 MG tablet    Sig: Take 1-2 tablets (4-8 mg total) by mouth every 8 (eight) hours as needed for nausea or vomiting.    Dispense:  20 tablet    Refill:  0    Imaging: XR Elbow 2 Views Right  Result Date: 09/11/2019 Stable ORIF of the right distal humerus fracture without complication.   PMFS History: Patient Active Problem List   Diagnosis Date Noted  . Right supracondylar humerus fracture, closed, initial encounter 08/20/2019  . Abnormal EKG 08/10/2016  . Diabetes mellitus with coincident hypertension ([place]) 08/10/2016   Past Medical History:  Diagnosis Date  . Ambulates with cane   . Arthritis   . Cancer [facility])    Endometrial - hysterectomy  . Depression   . Diabetes mellitus  without complication ([place])    Type II  . Dyspnea    currently with arm pain 08/22/19  . Glaucoma   . HLD (hyperlipidemia)   . Hypertension   . Wears dentures     Family History  Problem Relation Age of Onset  . CVA Mother   . Heart disease Father 50  . Heart attack Sister   . Heart disease Sister   . Arrhythmia Sister   . CVA Sister     Past Surgical History:  Procedure Laterality Date  . ABDOMINAL HYSTERECTOMY    . CERVICAL FUSION  2007  . EYE SURGERY Bilateral    cataracts  . ORIF HUMERUS FRACTURE Right 08/25/2019   Procedure: OPEN REDUCTION INTERNAL FIXATION (ORIF) RIGHT SUPRACONDYLAR HUMERUS FRACTURE, NEUROLYSIS ULNAR NERVE;  Surgeon: Leandrew Koyanagi, MD;  Location: [place];  Service: Orthopedics;  Laterality: Right;  . ORIF PATELLA Right 07/07/2016   Procedure: Partial Patellectomy;  Surgeon: Leandrew Koyanagi, MD;  Location: [place];  Service: Orthopedics;  Laterality: Right;   Social History   Occupational History  . Not on file  Tobacco Use  . Smoking status: Never Smoker  . Smokeless tobacco: Never Used  Substance and Sexual Activity  . Alcohol use: No  .  Drug use: No  . Sexual activity: Not on file    Comment: Hysterectomy

## 2019-09-14 ENCOUNTER — Other Ambulatory Visit: Payer: Self-pay

## 2019-09-14 ENCOUNTER — Encounter (HOSPITAL_COMMUNITY): Payer: Self-pay | Admitting: Emergency Medicine

## 2019-09-14 ENCOUNTER — Emergency Department (HOSPITAL_COMMUNITY): Payer: Medicare HMO

## 2019-09-14 ENCOUNTER — Inpatient Hospital Stay (HOSPITAL_COMMUNITY)
Admission: EM | Admit: 2019-09-14 | Discharge: 2019-09-25 | DRG: 857 | Disposition: A | Discharge status: Home Health | Payer: Medicare HMO | Attending: Internal Medicine | Admitting: Internal Medicine

## 2019-09-14 DIAGNOSIS — E669 Obesity, unspecified: Secondary | ICD-10-CM | POA: Diagnosis present

## 2019-09-14 DIAGNOSIS — E119 Type 2 diabetes mellitus without complications: Secondary | ICD-10-CM | POA: Diagnosis not present

## 2019-09-14 DIAGNOSIS — D6489 Other specified anemias: Secondary | ICD-10-CM | POA: Diagnosis not present

## 2019-09-14 DIAGNOSIS — Z7401 Bed confinement status: Secondary | ICD-10-CM | POA: Diagnosis not present

## 2019-09-14 DIAGNOSIS — Z20822 Contact with and (suspected) exposure to covid-19: Secondary | ICD-10-CM | POA: Diagnosis present

## 2019-09-14 DIAGNOSIS — E872 Acidosis: Secondary | ICD-10-CM | POA: Diagnosis present

## 2019-09-14 DIAGNOSIS — Z888 Allergy status to other drugs, medicaments and biological substances status: Secondary | ICD-10-CM

## 2019-09-14 DIAGNOSIS — Z6835 Body mass index (BMI) 35.0-35.9, adult: Secondary | ICD-10-CM | POA: Diagnosis not present

## 2019-09-14 DIAGNOSIS — T8140XA Infection following a procedure, unspecified, initial encounter: Secondary | ICD-10-CM

## 2019-09-14 DIAGNOSIS — T8140XD Infection following a procedure, unspecified, subsequent encounter: Secondary | ICD-10-CM | POA: Diagnosis not present

## 2019-09-14 DIAGNOSIS — L02414 Cutaneous abscess of left upper limb: Secondary | ICD-10-CM | POA: Diagnosis not present

## 2019-09-14 DIAGNOSIS — Z79899 Other long term (current) drug therapy: Secondary | ICD-10-CM

## 2019-09-14 DIAGNOSIS — I10 Essential (primary) hypertension: Secondary | ICD-10-CM

## 2019-09-14 DIAGNOSIS — E1165 Type 2 diabetes mellitus with hyperglycemia: Secondary | ICD-10-CM | POA: Diagnosis present

## 2019-09-14 DIAGNOSIS — A419 Sepsis, unspecified organism: Secondary | ICD-10-CM | POA: Diagnosis not present

## 2019-09-14 DIAGNOSIS — Z794 Long term (current) use of insulin: Secondary | ICD-10-CM

## 2019-09-14 DIAGNOSIS — T8149XA Infection following a procedure, other surgical site, initial encounter: Secondary | ICD-10-CM | POA: Diagnosis not present

## 2019-09-14 DIAGNOSIS — G8918 Other acute postprocedural pain: Secondary | ICD-10-CM

## 2019-09-14 DIAGNOSIS — Z03818 Encounter for observation for suspected exposure to other biological agents ruled out: Secondary | ICD-10-CM | POA: Diagnosis not present

## 2019-09-14 DIAGNOSIS — I517 Cardiomegaly: Secondary | ICD-10-CM | POA: Diagnosis not present

## 2019-09-14 DIAGNOSIS — R0602 Shortness of breath: Secondary | ICD-10-CM | POA: Diagnosis not present

## 2019-09-14 DIAGNOSIS — L03113 Cellulitis of right upper limb: Secondary | ICD-10-CM

## 2019-09-14 DIAGNOSIS — S72451A Displaced supracondylar fracture without intracondylar extension of lower end of right femur, initial encounter for closed fracture: Secondary | ICD-10-CM | POA: Diagnosis not present

## 2019-09-14 DIAGNOSIS — F329 Major depressive disorder, single episode, unspecified: Secondary | ICD-10-CM | POA: Diagnosis present

## 2019-09-14 DIAGNOSIS — E861 Hypovolemia: Secondary | ICD-10-CM | POA: Diagnosis not present

## 2019-09-14 DIAGNOSIS — S42414D Nondisplaced simple supracondylar fracture without intercondylar fracture of right humerus, subsequent encounter for fracture with routine healing: Secondary | ICD-10-CM | POA: Diagnosis not present

## 2019-09-14 DIAGNOSIS — I469 Cardiac arrest, cause unspecified: Secondary | ICD-10-CM | POA: Diagnosis not present

## 2019-09-14 DIAGNOSIS — E785 Hyperlipidemia, unspecified: Secondary | ICD-10-CM | POA: Diagnosis not present

## 2019-09-14 DIAGNOSIS — L02413 Cutaneous abscess of right upper limb: Secondary | ICD-10-CM | POA: Diagnosis not present

## 2019-09-14 DIAGNOSIS — E871 Hypo-osmolality and hyponatremia: Secondary | ICD-10-CM | POA: Diagnosis not present

## 2019-09-14 DIAGNOSIS — Z8249 Family history of ischemic heart disease and other diseases of the circulatory system: Secondary | ICD-10-CM

## 2019-09-14 DIAGNOSIS — M2550 Pain in unspecified joint: Secondary | ICD-10-CM | POA: Diagnosis not present

## 2019-09-14 DIAGNOSIS — Z823 Family history of stroke: Secondary | ICD-10-CM | POA: Diagnosis not present

## 2019-09-14 DIAGNOSIS — T8141XA Infection following a procedure, superficial incisional surgical site, initial encounter: Secondary | ICD-10-CM | POA: Diagnosis not present

## 2019-09-14 DIAGNOSIS — L89152 Pressure ulcer of sacral region, stage 2: Secondary | ICD-10-CM | POA: Diagnosis present

## 2019-09-14 DIAGNOSIS — L03115 Cellulitis of right lower limb: Secondary | ICD-10-CM | POA: Diagnosis not present

## 2019-09-14 LAB — CBC WITH DIFFERENTIAL/PLATELET
Abs Immature Granulocytes: 0.14 10*3/uL — ABNORMAL HIGH (ref 0.00–0.07)
Abs Immature Granulocytes: 0.19 10*3/uL — ABNORMAL HIGH (ref 0.00–0.07)
Basophils Absolute: 0 10*3/uL (ref 0.0–0.1)
Basophils Absolute: 0 10*3/uL (ref 0.0–0.1)
Basophils Relative: 0 %
Basophils Relative: 0 %
Eosinophils Absolute: 0.1 10*3/uL (ref 0.0–0.5)
Eosinophils Absolute: 0 10*3/uL (ref 0.0–0.5)
Eosinophils Relative: 1 %
Eosinophils Relative: 0 %
HCT: 35 % — ABNORMAL LOW (ref 36.0–46.0)
HCT: 34.8 % — ABNORMAL LOW (ref 36.0–46.0)
Hemoglobin: 11.4 g/dL — ABNORMAL LOW (ref 12.0–15.0)
Hemoglobin: 11.4 g/dL — ABNORMAL LOW (ref 12.0–15.0)
Immature Granulocytes: 1 %
Immature Granulocytes: 1 %
Lymphocytes Relative: 16 %
Lymphocytes Relative: 12 %
Lymphs Abs: 2.1 10*3/uL (ref 0.7–4.0)
Lymphs Abs: 1.6 10*3/uL (ref 0.7–4.0)
MCH: 29.2 pg (ref 26.0–34.0)
MCH: 29.4 pg (ref 26.0–34.0)
MCHC: 32.6 g/dL (ref 30.0–36.0)
MCHC: 32.8 g/dL (ref 30.0–36.0)
MCV: 89.7 fL (ref 80.0–100.0)
MCV: 89.7 fL (ref 80.0–100.0)
Monocytes Absolute: 1.4 10*3/uL — ABNORMAL HIGH (ref 0.1–1.0)
Monocytes Absolute: 1.2 10*3/uL — ABNORMAL HIGH (ref 0.1–1.0)
Monocytes Relative: 11 %
Monocytes Relative: 9 %
Neutro Abs: 9.5 10*3/uL — ABNORMAL HIGH (ref 1.7–7.7)
Neutro Abs: 10.1 10*3/uL — ABNORMAL HIGH (ref 1.7–7.7)
Neutrophils Relative %: 71 %
Neutrophils Relative %: 78 %
Platelets: 378 10*3/uL (ref 150–400)
Platelets: 408 10*3/uL — ABNORMAL HIGH (ref 150–400)
RBC: 3.9 MIL/uL (ref 3.87–5.11)
RBC: 3.88 MIL/uL (ref 3.87–5.11)
RDW: 13 % (ref 11.5–15.5)
RDW: 13.1 % (ref 11.5–15.5)
WBC: 13.3 10*3/uL — ABNORMAL HIGH (ref 4.0–10.5)
WBC: 13.2 10*3/uL — ABNORMAL HIGH (ref 4.0–10.5)
nRBC: 0 % (ref 0.0–0.2)
nRBC: 0 % (ref 0.0–0.2)

## 2019-09-14 LAB — PROTIME-INR
INR: 1.3 — ABNORMAL HIGH (ref 0.8–1.2)
Prothrombin Time: 15.7 s — ABNORMAL HIGH (ref 11.4–15.2)

## 2019-09-14 LAB — COMPREHENSIVE METABOLIC PANEL
ALT: 86 U/L — ABNORMAL HIGH (ref 0–44)
AST: 81 U/L — ABNORMAL HIGH (ref 15–41)
Albumin: 2.2 g/dL — ABNORMAL LOW (ref 3.5–5.0)
Alkaline Phosphatase: 92 U/L (ref 38–126)
Anion gap: 17 — ABNORMAL HIGH (ref 5–15)
BUN: 18 mg/dL (ref 8–23)
CO2: 21 mmol/L — ABNORMAL LOW (ref 22–32)
Calcium: 9.2 mg/dL (ref 8.9–10.3)
Chloride: 89 mmol/L — ABNORMAL LOW (ref 98–111)
Creatinine, Ser: 0.88 mg/dL (ref 0.44–1.00)
GFR calc Af Amer: >60 mL/min (ref >60)
GFR calc non Af Amer: >60 mL/min (ref >60)
Glucose, Bld: 362 mg/dL — ABNORMAL HIGH (ref 70–99)
Potassium: 4.4 mmol/L (ref 3.5–5.1)
Sodium: 127 mmol/L — ABNORMAL LOW (ref 135–145)
Total Bilirubin: 0.6 mg/dL (ref 0.3–1.2)
Total Protein: 7 g/dL (ref 6.5–8.1)

## 2019-09-14 LAB — LACTIC ACID, PLASMA
Lactic Acid, Venous: 2.3 mmol/L (ref 0.5–1.9)
Lactic Acid, Venous: 2.7 mmol/L (ref 0.5–1.9)

## 2019-09-14 LAB — SARS CORONAVIRUS 2 BY RT PCR (HOSPITAL ORDER, PERFORMED IN ~~LOC~~ HOSPITAL LAB): SARS Coronavirus 2: NEGATIVE

## 2019-09-14 LAB — CBG MONITORING, ED: Glucose-Capillary: 362 mg/dL — ABNORMAL HIGH (ref 70–99)

## 2019-09-14 MED ORDER — VANCOMYCIN HCL 2000 MG/400ML IV SOLN
2000.0000 mg | Freq: Once | INTRAVENOUS | Status: AC
  Administered 2019-09-15: 2000 mg via INTRAVENOUS
  Filled 2019-09-14: qty 400

## 2019-09-14 MED ORDER — VANCOMYCIN HCL 1500 MG/300ML IV SOLN
1500.0000 mg | INTRAVENOUS | Status: DC
  Administered 2019-09-15 – 2019-09-17 (×3): 1500 mg via INTRAVENOUS
  Filled 2019-09-14 (×4): qty 300

## 2019-09-14 MED ORDER — LACTATED RINGERS IV BOLUS
1000.0000 mL | Freq: Once | INTRAVENOUS | Status: AC
  Administered 2019-09-14: 1000 mL via INTRAVENOUS

## 2019-09-14 MED ORDER — SODIUM CHLORIDE 0.9% FLUSH: 3.0000 mL | Freq: Once | INTRAVENOUS | Status: DC

## 2019-09-14 NOTE — ED Triage Notes (Signed)
Pt had right arm surgery on the 11th, provider told her she has an infection at which time she was given antibiotics.  Continued to get worse, put a heating pad to it and "burst the spot".  It started bleeding and they could not get it to stop.  Bleeding appears controlled.  Appears SOB but vitals are stable.

## 2019-09-14 NOTE — Progress Notes (Signed)
Pharmacy Antibiotic Note  Kayla Griffin is a 79 y.o. female admitted on 09/14/2019 with R arm cellulitis (s/p surgery).  Pharmacy has been consulted for Vancomycin dosing. Noted pt on Augmentin PTA since 6/3  Plan: Vancomycin 2000mg  now then 1500 mg IV Q 24 hrs. Will f/u renal function, micro data, and pt's clinical condition Vanc levels prn   Height: 5\' 4"  (162.6 cm) Weight: 97.1 kg (214 lb 1.1 oz) IBW/kg (Calculated) : 54.7  Temp (24hrs), Avg:98 F (36.7 C), Min:97.7 F (36.5 C), Max:98.3 F (36.8 C)  Recent Labs  Lab 09/14/19 2008 09/14/19 2219  WBC 13.3* 13.2*  CREATININE 0.88  --   LATICACIDVEN 2.3* 2.7*    Estimated Creatinine Clearance: 59.6 mL/min (by C-G formula based on SCr of 0.88 mg/dL).    Allergies  Allergen Reactions  . Actos [Pioglitazone] Swelling    SWELLING REACTION UNSPECIFIED     Antimicrobials this admission: 6/6 Vanc >>   Microbiology results: 6/6/ BCx:   Thank you for allowing pharmacy to be a part of this patient's care.  Sherlon Handing, PharmD, BCPS Please see amion for complete clinical pharmacist phone list 09/14/2019 11:31 PM

## 2019-09-14 NOTE — ED Notes (Signed)
RN only able to draw blue top for second culture.

## 2019-09-14 NOTE — ED Provider Notes (Signed)
[place] EMERGENCY DEPARTMENT Provider Note   CSN: [id number] Arrival date & time: 09/14/19  1853     History Chief Complaint  Patient presents with  . Post-op Problem    Kayla Griffin is a 79 y.o. female.  Patient presenting approximate 2 and half weeks after surgery for supracondylar fracture of the right arm who was seen on Friday for increasing swelling and treated for cellulitis by her orthopedic surgeon now presenting the ED with worsening symptoms, drainage from the site.  Patient denies fevers but endorses chills.  The history is provided by the patient and medical records.  Illness Location:  Right elbow Quality:  Pain, swelling, discharge Severity:  Moderate Onset quality:  Gradual Timing:  Constant Progression:  Worsening Chronicity:  New Context:  Recent open surgery and fixation of trabecular fracture, worsening swelling pain and outpatient treatment for cellulitis Relieved by:  Oral pain medications Worsened by:  Palpation Ineffective treatments:  None tried Associated symptoms: nausea   Associated symptoms: no abdominal pain, no chest pain, no congestion, no cough, no fever, no headaches, no loss of consciousness, no shortness of breath and no vomiting        Past Medical History:  Diagnosis Date  . Ambulates with cane   . Arthritis   . Cancer [facility])    Endometrial - hysterectomy  . Depression   . Diabetes mellitus without complication ([place])    Type II  . Dyspnea    currently with arm pain 08/22/19  . Glaucoma   . HLD (hyperlipidemia)   . Hypertension   . Wears dentures     Patient Active Problem List   Diagnosis Date Noted  . Cellulitis of right upper extremity 09/14/2019  . Insulin-requiring or dependent type II diabetes mellitus ([place]) 09/14/2019  . Hyponatremia   . Right supracondylar humerus fracture, closed, initial encounter 08/20/2019  . Abnormal EKG 08/10/2016  . Hypertension 08/10/2016    Past Surgical  History:  Procedure Laterality Date  . ABDOMINAL HYSTERECTOMY    . CERVICAL FUSION  2007  . EYE SURGERY Bilateral    cataracts  . ORIF HUMERUS FRACTURE Right 08/25/2019   Procedure: OPEN REDUCTION INTERNAL FIXATION (ORIF) RIGHT SUPRACONDYLAR HUMERUS FRACTURE, NEUROLYSIS ULNAR NERVE;  Surgeon: Leandrew Koyanagi, MD;  Location: [place];  Service: Orthopedics;  Laterality: Right;  . ORIF PATELLA Right 07/07/2016   Procedure: Partial Patellectomy;  Surgeon: Leandrew Koyanagi, MD;  Location: [place];  Service: Orthopedics;  Laterality: Right;     OB History   No obstetric history on file.     Family History  Problem Relation Age of Onset  . CVA Mother   . Heart disease Father 106  . Heart attack Sister   . Heart disease Sister   . Arrhythmia Sister   . CVA Sister     Social History   Tobacco Use  . Smoking status: Never Smoker  . Smokeless tobacco: Never Used  Substance Use Topics  . Alcohol use: No  . Drug use: No    Home Medications Prior to Admission medications   Medication Sig Start Date End Date Taking? Authorizing Provider  alendronate (FOSAMAX) 70 MG tablet Take 70 mg by mouth every Sunday. Take with a full glass of water on an empty stomach.    [provider]  amoxicillin-clavulanate (AUGMENTIN) 875-125 MG tablet Take 1 tablet by mouth 2 (two) times daily. 09/11/19   Leandrew Koyanagi, MD  aspirin EC 325 MG tablet Take  1 tablet (325 mg total) by mouth daily. 07/07/16   Leandrew Koyanagi, MD  atorvastatin (LIPITOR) 40 MG tablet Take 40 mg by mouth daily.  05/16/16   [provider]  Calcium Carbonate-Vit D-Min (CALCIUM 1200 PO) Take 1,200 mg by mouth daily.    [provider]  calcium-vitamin D (OSCAL WITH D) 500-200 MG-UNIT tablet Take 1 tablet by mouth 3 (three) times daily. 08/25/19   Leandrew Koyanagi, MD  dorzolamide (TRUSOPT) 2 % ophthalmic solution Place 1 drop into both eyes 2 (two) times daily.     [provider]  hydrochlorothiazide (HYDRODIURIL) 25 MG  tablet Take 25 mg by mouth daily.     [provider]  insulin NPH-regular Human (NOVOLIN 70/30) (70-30) 100 UNIT/ML injection Inject 30 Units into the skin 2 (two) times daily with a meal.    [provider]  latanoprost (XALATAN) 0.005 % ophthalmic solution Place 1 drop into both eyes at bedtime.  06/15/16   [provider]  metFORMIN (GLUCOPHAGE) 500 MG tablet Take 500 mg by mouth 2 (two) times daily with a meal.    [provider]  methocarbamol (ROBAXIN) 750 MG tablet Take 1 tablet (750 mg total) by mouth 2 (two) times daily as needed for muscle spasms. 08/25/19   Leandrew Koyanagi, MD  metoprolol (LOPRESSOR) 100 MG tablet Take 100 mg by mouth 2 (two) times daily.    [provider]  ondansetron (ZOFRAN) 4 MG tablet Take 1-2 tablets (4-8 mg total) by mouth every 8 (eight) hours as needed for nausea or vomiting. 09/11/19   Leandrew Koyanagi, MD  oxyCODONE-acetaminophen (PERCOCET) 5-325 MG tablet Take 1 tablet by mouth every 6 (six) hours as needed. Patient taking differently: Take 1 tablet by mouth every 6 (six) hours as needed for moderate pain.  08/19/19   Drenda Freeze, MD  oxyCODONE-acetaminophen (PERCOCET) 5-325 MG tablet Take 1-2 tablets by mouth every 8 (eight) hours as needed for severe pain. 08/25/19   Leandrew Koyanagi, MD  ramipril (ALTACE) 10 MG capsule Take 20 mg by mouth daily.  05/16/16   [provider]    Allergies    Actos [pioglitazone]  Review of Systems   Review of Systems  Constitutional: Negative for fever.  HENT: Negative for congestion.   Respiratory: Negative for cough and shortness of breath.   Cardiovascular: Negative for chest pain.  Gastrointestinal: Positive for nausea. Negative for abdominal pain and vomiting.  Skin: Positive for color change and wound.  Neurological: Negative for loss of consciousness and headaches.  All other systems reviewed and are negative.   Physical Exam Updated Vital Signs BP (!) 149/67    Pulse 65   Temp 97.8 F (36.6 C) (Oral)   Resp 20   Ht 5\' 4"  (1.626 m)   Wt 92.5 kg   LMP  (LMP Unknown)   SpO2 100%   BMI 35.00 kg/m   Physical Exam Vitals and nursing note reviewed.  Constitutional:      General: She is not in acute distress.    Appearance: She is well-developed. She is ill-appearing.  HENT:     Head: Normocephalic and atraumatic.     Right Ear: External ear normal.     Left Ear: External ear normal.     Mouth/Throat:     Mouth: Mucous membranes are moist.  Eyes:     Extraocular Movements: Extraocular movements intact.     Conjunctiva/sclera: Conjunctivae normal.  Cardiovascular:  Rate and Rhythm: Normal rate and regular rhythm.     Pulses: Normal pulses.     Heart sounds: No murmur.  Pulmonary:     Effort: Pulmonary effort is normal. No respiratory distress.     Breath sounds: Normal breath sounds.  Abdominal:     General: There is no distension.     Palpations: Abdomen is soft.     Tenderness: There is no abdominal tenderness.  Musculoskeletal:        General: Swelling, tenderness and signs of injury present.     Cervical back: Neck supple.     Comments: Patient has significant erythema, swelling, small ammeter defect in the elbow draining serosanguineous fluid.  Patient stated copious fluid was pouring from this location earlier.  Patient has Steri-Strips in place from the mid upper arm to the mid forearm.  No large wound dehiscence.  Skin:    General: Skin is warm and dry.  Neurological:     General: No focal deficit present.     Mental Status: She is alert and oriented to person, place, and time.  Psychiatric:        Mood and Affect: Mood normal.        Behavior: Behavior normal.     ED Results / Procedures / Treatments   Labs (all labs ordered are listed, but only abnormal results are displayed) Labs Reviewed  LACTIC ACID, PLASMA - Abnormal; Notable for the following components:      Result Value   Lactic Acid, Venous 2.3 (*)     All other components within normal limits  LACTIC ACID, PLASMA - Abnormal; Notable for the following components:   Lactic Acid, Venous 2.7 (*)    All other components within normal limits  COMPREHENSIVE METABOLIC PANEL - Abnormal; Notable for the following components:   Sodium 127 (*)    Chloride 89 (*)    CO2 21 (*)    Glucose, Bld 362 (*)    Albumin 2.2 (*)    AST 81 (*)    ALT 86 (*)    Anion gap 17 (*)    All other components within normal limits  CBC WITH DIFFERENTIAL/PLATELET - Abnormal; Notable for the following components:   WBC 13.3 (*)    Hemoglobin 11.4 (*)    HCT 35.0 (*)    Neutro Abs 9.5 (*)    Monocytes Absolute 1.4 (*)    Abs Immature Granulocytes 0.14 (*)    All other components within normal limits  CBC WITH DIFFERENTIAL/PLATELET - Abnormal; Notable for the following components:   WBC 13.2 (*)    Hemoglobin 11.4 (*)    HCT 34.8 (*)    Platelets 408 (*)    Neutro Abs 10.1 (*)    Monocytes Absolute 1.2 (*)    Abs Immature Granulocytes 0.19 (*)    All other components within normal limits  PROTIME-INR - Abnormal; Notable for the following components:   Prothrombin Time 15.7 (*)    INR 1.3 (*)    All other components within normal limits  URINALYSIS, ROUTINE W REFLEX MICROSCOPIC - Abnormal; Notable for the following components:   Glucose, UA 50 (*)    Hgb urine dipstick SMALL (*)    All other components within normal limits  COMPREHENSIVE METABOLIC PANEL - Abnormal; Notable for the following components:   Sodium 130 (*)    Chloride 95 (*)    CO2 21 (*)    Glucose, Bld 144 (*)    Calcium 8.7 (*)  Total Protein 6.4 (*)    Albumin 2.0 (*)    AST 79 (*)    ALT 83 (*)    All other components within normal limits  CBC WITH DIFFERENTIAL/PLATELET - Abnormal; Notable for the following components:   WBC 13.4 (*)    RBC 3.58 (*)    Hemoglobin 10.4 (*)    HCT 31.7 (*)    Neutro Abs 10.2 (*)    Monocytes Absolute 1.3 (*)    Abs Immature Granulocytes 0.15  (*)    All other components within normal limits  LACTIC ACID, PLASMA - Abnormal; Notable for the following components:   Lactic Acid, Venous 2.4 (*)    All other components within normal limits  CULTURE, BLOOD (ROUTINE X 2)  CULTURE, BLOOD (ROUTINE X 2)  SARS CORONAVIRUS 2 BY RT PCR (HOSPITAL ORDER, [place])    EKG None  Radiology DG Chest 2 View  Result Date: 09/14/2019 CLINICAL DATA:  Suspected sepsis.  Shortness of breath. EXAM: CHEST - 2 VIEW COMPARISON:  Remote radiograph 07/19/2005 FINDINGS: Mild cardiomegaly, stable. Unchanged mediastinal contours. Atherosclerosis of the thoracic aorta. No confluent airspace disease. No pleural fluid or pneumothorax. No pulmonary edema. Surgical hardware in the lower cervical spine. Chronic compression fracture in the lower thoracic spine, as seen on lumbar radiograph 02/17/2019. IMPRESSION: 1. No acute chest findings. 2. Stable mild cardiomegaly. Aortic Atherosclerosis (ICD10-I70.0). Electronically Signed   By: Keith Rake M.D.   On: 09/14/2019 22:47    Procedures Procedures (including critical care time)  Medications Ordered in ED Medications  sodium chloride flush (NS) 0.9 % injection 3 mL (3 mLs Intravenous Not Given 09/15/19 0054)  vancomycin (VANCOREADY) IVPB 1500 mg/300 mL (has no administration in time range)  0.9 %  sodium chloride infusion ( Intravenous New Bag/Given 09/15/19 0122)  acetaminophen (TYLENOL) tablet 650 mg (has no administration in time range)    Or  acetaminophen (TYLENOL) suppository 650 mg (has no administration in time range)  senna-docusate (Senokot-S) tablet 1 tablet (has no administration in time range)  ondansetron (ZOFRAN) tablet 4 mg (has no administration in time range)    Or  ondansetron (ZOFRAN) injection 4 mg (has no administration in time range)  oxyCODONE-acetaminophen (PERCOCET/ROXICET) 5-325 MG per tablet 1-2 tablet (1 tablet Oral Given 09/15/19 0112)  insulin aspart  (novoLOG) injection 0-9 Units (0 Units Subcutaneous Not Given 09/15/19 1241)  ramipril (ALTACE) capsule 20 mg (20 mg Oral Given 09/15/19 0908)  metoprolol tartrate (LOPRESSOR) tablet 100 mg (100 mg Oral Given 09/15/19 0905)  HYDROcodone-acetaminophen (NORCO) 7.5-325 MG per tablet 1-2 tablet (has no administration in time range)  morphine 2 MG/ML injection 1 mg (has no administration in time range)  insulin aspart protamine- aspart (NOVOLOG MIX 70/30) injection 25 Units (has no administration in time range)  mupirocin ointment (BACTROBAN) 2 % 1 application (1 application Nasal Given 09/15/19 1058)  lactated ringers bolus 1,000 mL (1,000 mLs Intravenous New Bag/Given 09/14/19 2326)  vancomycin (VANCOREADY) IVPB 2000 mg/400 mL (0 mg Intravenous Stopped 09/15/19 0217)    ED Course  I have reviewed the triage vital signs and the nursing notes.  Pertinent labs & imaging results that were available during my care of the patient were reviewed by me and considered in my medical decision making (see chart for details).    MDM Rules/Calculators/A&P                     Differential diagnosis: Cellulitis, sepsis, postop  problem, abscess  ED physician interpretation of labs: Elevated lactic acidosis improving IV fluid.  Leukocytosis.  MDM: Patient presenting to the ED with worsening infection at right arm surgical site with  lactic acidosis and leukocytosis with stable vital signs requiring orthopedic surgery and hospital admission for further treatment and management.  Patient's vital signs are stable, patient is afebrile.  Patient's physical exam unremarkable with no mention right arm findings consistent with cellulitis/abscess/possible serous fluid collection.  Patient currently on antibiotics from orthopedic surgery.  Patient discussed with Ortho and hospitalist.  Will admit to hospitalist service with Ortho following.  Additional antibiotic choice discussed with hospitalist and deferred to their  preference.  Diagnosis, treatment and plan of care was discussed and agreed upon with patient.  Patient comfortable with admission at this time.   Final Clinical Impression(s) / ED Diagnoses Final diagnoses:  Post-operative pain  Postoperative infection, unspecified type, subsequent encounter  Cellulitis of right upper extremity    Rx / DC Orders ED Discharge Orders    None       Delma Post, MD 09/15/19 1256    Elnora Morrison, MD 09/15/19 2342

## 2019-09-15 ENCOUNTER — Encounter (HOSPITAL_COMMUNITY)
Admission: EM | Disposition: A | Discharge status: Home Health | Payer: Self-pay | Source: Home / Self Care | Attending: Family Medicine

## 2019-09-15 ENCOUNTER — Other Ambulatory Visit: Payer: Self-pay

## 2019-09-15 ENCOUNTER — Inpatient Hospital Stay (HOSPITAL_COMMUNITY): Payer: Medicare HMO | Admitting: Anesthesiology

## 2019-09-15 DIAGNOSIS — L02413 Cutaneous abscess of right upper limb: Secondary | ICD-10-CM

## 2019-09-15 HISTORY — PX: I & D EXTREMITY: SHX5045

## 2019-09-15 LAB — CBC WITH DIFFERENTIAL/PLATELET
Abs Immature Granulocytes: 0.15 10*3/uL — ABNORMAL HIGH (ref 0.00–0.07)
Basophils Absolute: 0 10*3/uL (ref 0.0–0.1)
Basophils Relative: 0 %
Eosinophils Absolute: 0 10*3/uL (ref 0.0–0.5)
Eosinophils Relative: 0 %
HCT: 31.7 % — ABNORMAL LOW (ref 36.0–46.0)
Hemoglobin: 10.4 g/dL — ABNORMAL LOW (ref 12.0–15.0)
Immature Granulocytes: 1 %
Lymphocytes Relative: 13 %
Lymphs Abs: 1.7 10*3/uL (ref 0.7–4.0)
MCH: 29.1 pg (ref 26.0–34.0)
MCHC: 32.8 g/dL (ref 30.0–36.0)
MCV: 88.5 fL (ref 80.0–100.0)
Monocytes Absolute: 1.3 10*3/uL — ABNORMAL HIGH (ref 0.1–1.0)
Monocytes Relative: 10 %
Neutro Abs: 10.2 10*3/uL — ABNORMAL HIGH (ref 1.7–7.7)
Neutrophils Relative %: 76 %
Platelets: 377 10*3/uL (ref 150–400)
RBC: 3.58 MIL/uL — ABNORMAL LOW (ref 3.87–5.11)
RDW: 13 % (ref 11.5–15.5)
WBC: 13.4 10*3/uL — ABNORMAL HIGH (ref 4.0–10.5)
nRBC: 0 % (ref 0.0–0.2)

## 2019-09-15 LAB — GLUCOSE, CAPILLARY
Glucose-Capillary: 116 mg/dL — ABNORMAL HIGH (ref 70–99)
Glucose-Capillary: 120 mg/dL — ABNORMAL HIGH (ref 70–99)
Glucose-Capillary: 134 mg/dL — ABNORMAL HIGH (ref 70–99)
Glucose-Capillary: 140 mg/dL — ABNORMAL HIGH (ref 70–99)
Glucose-Capillary: 164 mg/dL — ABNORMAL HIGH (ref 70–99)
Glucose-Capillary: 181 mg/dL — ABNORMAL HIGH (ref 70–99)
Glucose-Capillary: 97 mg/dL (ref 70–99)

## 2019-09-15 LAB — COMPREHENSIVE METABOLIC PANEL
ALT: 83 U/L — ABNORMAL HIGH (ref 0–44)
AST: 79 U/L — ABNORMAL HIGH (ref 15–41)
Albumin: 2 g/dL — ABNORMAL LOW (ref 3.5–5.0)
Alkaline Phosphatase: 82 U/L (ref 38–126)
Anion gap: 14 (ref 5–15)
BUN: 14 mg/dL (ref 8–23)
CO2: 21 mmol/L — ABNORMAL LOW (ref 22–32)
Calcium: 8.7 mg/dL — ABNORMAL LOW (ref 8.9–10.3)
Chloride: 95 mmol/L — ABNORMAL LOW (ref 98–111)
Creatinine, Ser: 0.69 mg/dL (ref 0.44–1.00)
GFR calc Af Amer: >60 mL/min (ref >60)
GFR calc non Af Amer: >60 mL/min (ref >60)
Glucose, Bld: 144 mg/dL — ABNORMAL HIGH (ref 70–99)
Potassium: 3.6 mmol/L (ref 3.5–5.1)
Sodium: 130 mmol/L — ABNORMAL LOW (ref 135–145)
Total Bilirubin: 0.7 mg/dL (ref 0.3–1.2)
Total Protein: 6.4 g/dL — ABNORMAL LOW (ref 6.5–8.1)

## 2019-09-15 LAB — HEMOGLOBIN A1C
Hgb A1c MFr Bld: 7.3 % — ABNORMAL HIGH (ref 4.8–5.6)
Mean Plasma Glucose: 162.81 mg/dL

## 2019-09-15 LAB — URINALYSIS, ROUTINE W REFLEX MICROSCOPIC
Bacteria, UA: NONE SEEN
Bilirubin Urine: NEGATIVE
Glucose, UA: 50 mg/dL — AB
Ketones, ur: NEGATIVE mg/dL
Leukocytes,Ua: NEGATIVE
Nitrite: NEGATIVE
Protein, ur: NEGATIVE mg/dL
Specific Gravity, Urine: 1.006 (ref 1.005–1.030)
pH: 6 (ref 5.0–8.0)

## 2019-09-15 LAB — LACTIC ACID, PLASMA
Lactic Acid, Venous: 2.4 mmol/L (ref 0.5–1.9)
Lactic Acid, Venous: 1.2 mmol/L (ref 0.5–1.9)

## 2019-09-15 LAB — C-REACTIVE PROTEIN: CRP: 19.5 mg/dL — ABNORMAL HIGH (ref <1.0)

## 2019-09-15 LAB — SEDIMENTATION RATE: Sed Rate: 94 mm/hr — ABNORMAL HIGH (ref 0–22)

## 2019-09-15 SURGERY — IRRIGATION AND DEBRIDEMENT EXTREMITY
Anesthesia: General | Laterality: Right

## 2019-09-15 MED ORDER — RAMIPRIL 10 MG PO CAPS
20.0000 mg | ORAL_CAPSULE | Freq: Every day | ORAL | Status: DC
  Administered 2019-09-15 – 2019-09-25 (×11): 20 mg via ORAL
  Filled 2019-09-15 (×11): qty 2

## 2019-09-15 MED ORDER — HYDROMORPHONE HCL 1 MG/ML IJ SOLN
0.2500 mg | INTRAMUSCULAR | Status: DC | PRN
  Administered 2019-09-15 (×3): 0.5 mg via INTRAVENOUS

## 2019-09-15 MED ORDER — MORPHINE SULFATE (PF) 2 MG/ML IV SOLN: 1.0000 mg | INTRAVENOUS | Status: DC | PRN

## 2019-09-15 MED ORDER — SODIUM CHLORIDE 0.9 % IV SOLN: INTRAVENOUS | Status: AC

## 2019-09-15 MED ORDER — OXYCODONE-ACETAMINOPHEN 5-325 MG PO TABS
1.0000 | ORAL_TABLET | Freq: Four times a day (QID) | ORAL | Status: DC | PRN
  Administered 2019-09-15 – 2019-09-16 (×2): 1 via ORAL
  Filled 2019-09-15: qty 1
  Filled 2019-09-15 (×2): qty 2

## 2019-09-15 MED ORDER — LIDOCAINE 2% (20 MG/ML) 5 ML SYRINGE
INTRAMUSCULAR | Status: DC | PRN
  Administered 2019-09-15: 100 mg via INTRAVENOUS

## 2019-09-15 MED ORDER — CHLORHEXIDINE GLUCONATE 0.12 % MT SOLN
OROMUCOSAL | Status: AC
  Administered 2019-09-15: 15 mL via OROMUCOSAL
  Filled 2019-09-15: qty 15

## 2019-09-15 MED ORDER — HYDROMORPHONE HCL 1 MG/ML IJ SOLN
INTRAMUSCULAR | Status: AC
  Filled 2019-09-15: qty 1

## 2019-09-15 MED ORDER — ACETAMINOPHEN 325 MG PO TABS
650.0000 mg | ORAL_TABLET | Freq: Four times a day (QID) | ORAL | Status: DC | PRN
  Administered 2019-09-16: 650 mg via ORAL
  Filled 2019-09-15: qty 2

## 2019-09-15 MED ORDER — SENNOSIDES-DOCUSATE SODIUM 8.6-50 MG PO TABS: 1.0000 | ORAL_TABLET | Freq: Every evening | ORAL | Status: DC | PRN

## 2019-09-15 MED ORDER — INSULIN ASPART 100 UNIT/ML ~~LOC~~ SOLN
0.0000 [IU] | SUBCUTANEOUS | Status: DC
  Administered 2019-09-15: 1 [IU] via SUBCUTANEOUS
  Administered 2019-09-15 – 2019-09-16 (×2): 2 [IU] via SUBCUTANEOUS
  Administered 2019-09-16: 3 [IU] via SUBCUTANEOUS
  Administered 2019-09-16: 2 [IU] via SUBCUTANEOUS
  Administered 2019-09-16: 5 [IU] via SUBCUTANEOUS
  Administered 2019-09-16: 2 [IU] via SUBCUTANEOUS
  Administered 2019-09-16: 5 [IU] via SUBCUTANEOUS
  Administered 2019-09-16: 2 [IU] via SUBCUTANEOUS
  Administered 2019-09-17: 3 [IU] via SUBCUTANEOUS
  Administered 2019-09-17 (×3): 2 [IU] via SUBCUTANEOUS
  Administered 2019-09-17: 3 [IU] via SUBCUTANEOUS
  Administered 2019-09-18 (×2): 5 [IU] via SUBCUTANEOUS
  Administered 2019-09-18 – 2019-09-19 (×3): 2 [IU] via SUBCUTANEOUS
  Administered 2019-09-19: 7 [IU] via SUBCUTANEOUS
  Administered 2019-09-19: 5 [IU] via SUBCUTANEOUS
  Administered 2019-09-19 (×2): 3 [IU] via SUBCUTANEOUS
  Administered 2019-09-20: 2 [IU] via SUBCUTANEOUS
  Administered 2019-09-20: 5 [IU] via SUBCUTANEOUS
  Administered 2019-09-20: 2 [IU] via SUBCUTANEOUS
  Administered 2019-09-20: 7 [IU] via SUBCUTANEOUS
  Administered 2019-09-20: 2 [IU] via SUBCUTANEOUS
  Administered 2019-09-20: 5 [IU] via SUBCUTANEOUS
  Administered 2019-09-21: 2 [IU] via SUBCUTANEOUS
  Administered 2019-09-21: 1 [IU] via SUBCUTANEOUS
  Administered 2019-09-21 (×2): 3 [IU] via SUBCUTANEOUS
  Administered 2019-09-21 – 2019-09-22 (×3): 2 [IU] via SUBCUTANEOUS
  Administered 2019-09-22: 3 [IU] via SUBCUTANEOUS
  Administered 2019-09-22 (×2): 2 [IU] via SUBCUTANEOUS
  Administered 2019-09-22: 3 [IU] via SUBCUTANEOUS
  Administered 2019-09-23: 2 [IU] via SUBCUTANEOUS
  Administered 2019-09-23: 3 [IU] via SUBCUTANEOUS
  Administered 2019-09-23: 2 [IU] via SUBCUTANEOUS
  Administered 2019-09-23: 5 [IU] via SUBCUTANEOUS
  Administered 2019-09-24: 1 [IU] via SUBCUTANEOUS
  Administered 2019-09-24 (×2): 2 [IU] via SUBCUTANEOUS
  Administered 2019-09-24: 3 [IU] via SUBCUTANEOUS
  Administered 2019-09-25: 1 [IU] via SUBCUTANEOUS

## 2019-09-15 MED ORDER — METOPROLOL TARTRATE 50 MG PO TABS
100.0000 mg | ORAL_TABLET | Freq: Two times a day (BID) | ORAL | Status: DC
  Administered 2019-09-15 – 2019-09-25 (×22): 100 mg via ORAL
  Filled 2019-09-15 (×22): qty 2

## 2019-09-15 MED ORDER — MUPIROCIN 2 % EX OINT
1.0000 "application " | TOPICAL_OINTMENT | Freq: Two times a day (BID) | CUTANEOUS | Status: AC
  Administered 2019-09-15 – 2019-09-19 (×10): 1 via NASAL
  Filled 2019-09-15: qty 22

## 2019-09-15 MED ORDER — CHLORHEXIDINE GLUCONATE 0.12 % MT SOLN
15.0000 mL | Freq: Once | OROMUCOSAL | Status: AC
  Filled 2019-09-15: qty 15

## 2019-09-15 MED ORDER — ONDANSETRON HCL 4 MG/2ML IJ SOLN: 4.0000 mg | Freq: Three times a day (TID) | INTRAMUSCULAR | Status: DC | PRN

## 2019-09-15 MED ORDER — INSULIN GLARGINE 100 UNIT/ML ~~LOC~~ SOLN
10.0000 [IU] | Freq: Two times a day (BID) | SUBCUTANEOUS | Status: DC
  Filled 2019-09-15 (×7): qty 0.1

## 2019-09-15 MED ORDER — ACETAMINOPHEN 650 MG RE SUPP: 650.0000 mg | Freq: Four times a day (QID) | RECTAL | Status: DC | PRN

## 2019-09-15 MED ORDER — FENTANYL CITRATE (PF) 250 MCG/5ML IJ SOLN
INTRAMUSCULAR | Status: AC
  Filled 2019-09-15: qty 5

## 2019-09-15 MED ORDER — OXYCODONE HCL 5 MG PO TABS: 5.0000 mg | ORAL_TABLET | Freq: Once | ORAL | Status: DC | PRN

## 2019-09-15 MED ORDER — ONDANSETRON HCL 4 MG/2ML IJ SOLN: 4.0000 mg | Freq: Four times a day (QID) | INTRAMUSCULAR | Status: DC | PRN

## 2019-09-15 MED ORDER — DEXAMETHASONE SODIUM PHOSPHATE 10 MG/ML IJ SOLN
INTRAMUSCULAR | Status: DC | PRN
  Administered 2019-09-15: 4 mg via INTRAVENOUS

## 2019-09-15 MED ORDER — PROPOFOL 10 MG/ML IV BOLUS
INTRAVENOUS | Status: DC | PRN
  Administered 2019-09-15: 120 mg via INTRAVENOUS

## 2019-09-15 MED ORDER — ONDANSETRON HCL 4 MG/2ML IJ SOLN
INTRAMUSCULAR | Status: DC | PRN
  Administered 2019-09-15: 4 mg via INTRAVENOUS

## 2019-09-15 MED ORDER — ONDANSETRON HCL 4 MG PO TABS
4.0000 mg | ORAL_TABLET | Freq: Four times a day (QID) | ORAL | Status: DC | PRN
  Administered 2019-09-21: 4 mg via ORAL
  Filled 2019-09-15: qty 1

## 2019-09-15 MED ORDER — HYDROCODONE-ACETAMINOPHEN 7.5-325 MG PO TABS: 1.0000 | ORAL_TABLET | Freq: Four times a day (QID) | ORAL | Status: DC | PRN

## 2019-09-15 MED ORDER — VANCOMYCIN HCL 1000 MG IV SOLR
INTRAVENOUS | Status: DC | PRN
  Administered 2019-09-15: 2 g

## 2019-09-15 MED ORDER — PROMETHAZINE HCL 25 MG/ML IJ SOLN: 6.2500 mg | INTRAMUSCULAR | Status: DC | PRN

## 2019-09-15 MED ORDER — OXYCODONE HCL 5 MG/5ML PO SOLN: 5.0000 mg | Freq: Once | ORAL | Status: DC | PRN

## 2019-09-15 MED ORDER — ONDANSETRON 4 MG PO TBDP: 8.0000 mg | ORAL_TABLET | Freq: Three times a day (TID) | ORAL | Status: DC | PRN

## 2019-09-15 MED ORDER — INSULIN ASPART PROT & ASPART (70-30 MIX) 100 UNIT/ML ~~LOC~~ SUSP
25.0000 [IU] | Freq: Two times a day (BID) | SUBCUTANEOUS | Status: DC
  Filled 2019-09-15: qty 10

## 2019-09-15 MED ORDER — LACTATED RINGERS IV SOLN: INTRAVENOUS | Status: DC

## 2019-09-15 MED ORDER — VANCOMYCIN HCL 1000 MG IV SOLR
INTRAVENOUS | Status: AC
  Filled 2019-09-15: qty 2000

## 2019-09-15 MED ORDER — SODIUM CHLORIDE 0.9 % IR SOLN
Status: DC | PRN
  Administered 2019-09-15: 6000 mL

## 2019-09-15 MED ORDER — FENTANYL CITRATE (PF) 250 MCG/5ML IJ SOLN
INTRAMUSCULAR | Status: DC | PRN
  Administered 2019-09-15 (×3): 50 ug via INTRAVENOUS

## 2019-09-15 MED ORDER — PHENYLEPHRINE 40 MCG/ML (10ML) SYRINGE FOR IV PUSH (FOR BLOOD PRESSURE SUPPORT)
PREFILLED_SYRINGE | INTRAVENOUS | Status: DC | PRN
  Administered 2019-09-15: 80 ug via INTRAVENOUS

## 2019-09-15 SURGICAL SUPPLY — 43 items
BNDG COHESIVE 4X5 TAN STRL (GAUZE/BANDAGES/DRESSINGS) ×2 IMPLANT
BNDG COHESIVE 6X5 TAN STRL LF (GAUZE/BANDAGES/DRESSINGS) ×3 IMPLANT
BNDG ELASTIC 3X5.8 VLCR STR LF (GAUZE/BANDAGES/DRESSINGS) IMPLANT
BNDG ELASTIC 4X5.8 VLCR STR LF (GAUZE/BANDAGES/DRESSINGS) ×1 IMPLANT
BNDG GAUZE ELAST 4 BULKY (GAUZE/BANDAGES/DRESSINGS) ×2 IMPLANT
COVER SURGICAL LIGHT HANDLE (MISCELLANEOUS) ×2 IMPLANT
COVER WAND RF STERILE (DRAPES) ×2 IMPLANT
DRAPE U-SHAPE 47X51 STRL (DRAPES) ×2 IMPLANT
DRSG PAD ABDOMINAL 8X10 ST (GAUZE/BANDAGES/DRESSINGS) ×2 IMPLANT
DRSG TEGADERM 4X4.75 (GAUZE/BANDAGES/DRESSINGS) ×9 IMPLANT
ELECT REM PT RETURN 9FT ADLT (ELECTROSURGICAL)
ELECTRODE REM PT RTRN 9FT ADLT (ELECTROSURGICAL) IMPLANT
GAUZE SPONGE 4X4 12PLY STRL (GAUZE/BANDAGES/DRESSINGS) ×3 IMPLANT
GAUZE XEROFORM 5X9 LF (GAUZE/BANDAGES/DRESSINGS) ×2 IMPLANT
GLOVE BIOGEL PI IND STRL 7.0 (GLOVE) ×1 IMPLANT
GLOVE BIOGEL PI INDICATOR 7.0 (GLOVE) ×1
GLOVE ECLIPSE 7.0 STRL STRAW (GLOVE) ×2 IMPLANT
GLOVE SKINSENSE NS SZ7.5 (GLOVE) ×4
GLOVE SKINSENSE STRL SZ7.5 (GLOVE) ×2 IMPLANT
GOWN STRL REIN XL XLG (GOWN DISPOSABLE) ×4 IMPLANT
HANDPIECE INTERPULSE COAX TIP (DISPOSABLE)
KIT BASIN OR (CUSTOM PROCEDURE TRAY) ×2 IMPLANT
KIT TURNOVER KIT B (KITS) ×2 IMPLANT
MANIFOLD NEPTUNE II (INSTRUMENTS) ×2 IMPLANT
PACK ORTHO EXTREMITY (CUSTOM PROCEDURE TRAY) ×2 IMPLANT
PAD ARMBOARD 7.5X6 YLW CONV (MISCELLANEOUS) ×4 IMPLANT
PADDING CAST ABS 4INX4YD NS (CAST SUPPLIES) ×1
PADDING CAST ABS COTTON 4X4 ST (CAST SUPPLIES) ×1 IMPLANT
PADDING CAST COTTON 6X4 STRL (CAST SUPPLIES) ×2 IMPLANT
SET HNDPC FAN SPRY TIP SCT (DISPOSABLE) IMPLANT
SPONGE LAP 18X18 RF (DISPOSABLE) ×2 IMPLANT
STOCKINETTE IMPERVIOUS 9X36 MD (GAUZE/BANDAGES/DRESSINGS) ×2 IMPLANT
SUT ETHILON 2 0 FS 18 (SUTURE) ×2 IMPLANT
SUT ETHILON 2 0 PSLX (SUTURE) ×2 IMPLANT
SUT MON AB 2-0 CT1 36 (SUTURE) ×1 IMPLANT
SUT VIC AB 2-0 FS1 27 (SUTURE) ×4 IMPLANT
SWAB CULTURE ESWAB REG 1ML (MISCELLANEOUS) IMPLANT
TOWEL GREEN STERILE (TOWEL DISPOSABLE) ×2 IMPLANT
TOWEL GREEN STERILE FF (TOWEL DISPOSABLE) ×2 IMPLANT
TUBE CONNECTING 12X1/4 (SUCTIONS) ×2 IMPLANT
UNDERPAD 30X36 HEAVY ABSORB (UNDERPADS AND DIAPERS) ×4 IMPLANT
WATER STERILE IRR 1000ML POUR (IV SOLUTION) ×2 IMPLANT
YANKAUER SUCT BULB TIP NO VENT (SUCTIONS) ×2 IMPLANT

## 2019-09-15 NOTE — Consult Note (Signed)
WOC Nurse Consult Note: Reason for Consult:Moisture associated skin damage to right pannus. Stage 2 sacral pressure injury Wound type:moisture and pressure Pressure Injury POA: Yes Measurement: Right pannus:  4 cm x 5.5 cm erythema with scattered 0.1 cm nonintact wound bed. Stage 2 sacral wound 1 cm x 1.5 cm x 0.1 cm  Wound TKT:CCEQ and moist Drainage (amount, consistency, odor) scant weeping Periwound:intact Dressing procedure/placement/frequency: Cleanse right pannus wound with soap and water and pat dry. Apply interdry skin fold management linen. Measure and cut length of InterDry to fit in skin folds that have skin breakdown Tuck InterDry fabric into skin folds in a single layer, allow for 2 inches of overhang from skin edges to allow for wicking to occur May remove to bathe; dry area thoroughly and then tuck into affected areas again Do not apply any creams or ointments when using InterDry DO NOT THROW AWAY FOR 5 DAYS unless soiled with stool DO NOT [facility] product, this will inactivate the silver in the material  New sheet of Interdry should be applied after 5 days of use if patient continues to have skin breakdown  Cleanse sacral wound with soap and water and pat dry.  Apply sacral foam and change every three days.  Will not follow at this time.  Please re-consult if needed.  Domenic Moras MSN, RN, FNP-BC CWON Wound, Ostomy, Continence Nurse Pager [phone]

## 2019-09-15 NOTE — Progress Notes (Addendum)
[facility]                                                                              Patient Demographics  Kayla Griffin, is a 79 y.o. female, DOB - August 05, 1940, ZGY:[id number]  Admit date - 09/14/2019   Admitting Physician Kayla Bulls, MD  Outpatient Primary MD for the patient is [name], [name], MD  Outpatient specialists:   LOS - 1  days   Medical records reviewed and are as summarized below:    Chief Complaint  Patient presents with  . Post-op Problem       Brief summary   Patient is a 79 year old female with IDDM, hypertension, hyperlipidemia, endometrial CA status post hysterectomy, supracondylar right humerus fracture status post ORIF on 08/25/2019 presented with increasing redness around the surgical site, no drainage despite starting Augmentin a few days ago.  Patient underwent ORIF on right humerus on 08/25/2019, developed erythema at the site and increasing pain, was seen by her orthopedic surgeon on 6/3 and was started on Augmentin with concern for cellulitis.  Unfortunately her condition continued to worsen and she developed drainage from the site and presented to ED. WBCs 13.3, lactic acid 2.7, sodium 127 orthopedics was consulted, patient was admitted for further work-up  Assessment & Plan    Principal Problem:   Cellulitis of right upper extremity with purulent drainage -Presented with progressive erythema involving the surgical site, now draining, in setting of ORIF performed on 5/17 -Blood cultures obtained, started on IV vancomycin.  Keep arm elevated. -Seen by orthopedics, Dr. Erlinda Hong, recommended I&D today, continue n.p.o. -Continue pain control, continue bowel regimen  Active Problems:   Hypertension -BP currently stable, continue ramipril, metoprolol  Lactic acidosis, hyponatremia -Resolved, patient was placed on gentle IV fluid hydration -Sodium 127 at the time of admission, currently improving 130    Insulin-requiring or  dependent type II diabetes mellitus (HCC) -Continue sliding scale insulin, patient is on NovoLog 70/30, continue 25 units twice daily (on 30 units twice daily at home)  Pressure injury, POA Pressure Injury 09/15/19 Buttocks Left Stage 2 -  Partial thickness loss of dermis presenting as a shallow open injury with a red, pink wound bed without slough. (Active)  09/15/19 0100  Location: Buttocks  Location Orientation: Left  Staging: Stage 2 -  Partial thickness loss of dermis presenting as a shallow open injury with a red, pink wound bed without slough.  Wound Description (Comments):   Present on Admission: Yes    Obesity Estimated body mass index is 35 kg/m as calculated from the following:   Height as of this encounter: 5\' 4"  (1.626 m).   Weight as of this encounter: 92.5 kg.  Code Status: Full CODE STATUS DVT Prophylaxis: SCD's Family Communication: Discussed all imaging results, lab results, explained to the patient and patient's sister on the phone  Disposition Plan:     Status is: Inpatient  Remains inpatient appropriate because:Inpatient level of care appropriate due to severity of illness   Dispo: The patient is from: Home              Anticipated d/c is to: Home  Anticipated d/c date is: 2 days              Patient currently is not medically stable to d/c.  Time Spent in minutes  25 minutes  Procedures:  None Consultants:   Orthopedics   Antimicrobials:   Anti-infectives (From admission, onward)   Start     Dose/Rate Route Frequency Ordered Stop   09/15/19 2300  vancomycin (VANCOREADY) IVPB 1500 mg/300 mL     1,500 mg 150 mL/hr over 120 Minutes Intravenous Every 24 hours 09/14/19 2332     09/14/19 2345  vancomycin (VANCOREADY) IVPB 2000 mg/400 mL     2,000 mg 200 mL/hr over 120 Minutes Intravenous  Once 09/14/19 2326 09/15/19 0217          Medications  Scheduled Meds: . insulin aspart  0-9 Units Subcutaneous Q4H  . insulin aspart  protamine- aspart  25 Units Subcutaneous BID WC  . metoprolol tartrate  100 mg Oral BID  . ramipril  20 mg Oral Daily  . sodium chloride flush  3 mL Intravenous Once   Continuous Infusions: . sodium chloride 100 mL/hr at 09/15/19 0122  . vancomycin     PRN Meds:.acetaminophen **OR** acetaminophen, HYDROcodone-acetaminophen, morphine injection, ondansetron **OR** ondansetron (ZOFRAN) IV, oxyCODONE-acetaminophen, senna-docusate      Subjective:   Kayla Griffin was seen and examined today.  No complaints, pain in the right arm, currently controlled, 5/10, constant.  No fevers or chills.  Patient denies dizziness, chest pain, shortness of breath, abdominal pain, N/V/D/C, new weakness, numbess, tingling. No acute events overnight.    Objective:   Vitals:   09/15/19 0229 09/15/19 0316 09/15/19 0755 09/15/19 0908  BP: 126/70  (!) 149/67 (!) 149/67  Pulse: 65  65   Resp: 14  20   Temp: 98.3 F (36.8 C)  97.8 F (36.6 C)   TempSrc: Oral  Oral   SpO2: 97%  100%   Weight:  92.5 kg    Height:        Intake/Output Summary (Last 24 hours) at 09/15/2019 1009 Last data filed at 09/15/2019 0900 Gross per 24 hour  Intake 1562.63 ml  Output --  Net 1562.63 ml     Wt Readings from Last 3 Encounters:  09/15/19 92.5 kg  09/11/19 97.1 kg  08/25/19 97.1 kg     Exam  General: Alert and oriented x 3, NAD  Cardiovascular: S1 S2 auscultated, no murmurs, RRR  Respiratory: Clear to auscultation bilaterally, no wheezing, rales or rhonchi  Gastrointestinal: Soft, nontender, nondistended, + bowel sounds  Ext: no pedal edema bilaterally, right arm dressing intact  Neuro: No new deficits  Musculoskeletal: No digital cyanosis, clubbing  Skin: No rashes  Psych: Normal affect and demeanor, alert and oriented x3    Data Reviewed:  I have personally reviewed following labs and imaging studies  Micro Results Recent Results (from the past 240 hour(s))  Culture, blood (Routine x 2)      Status: None (Preliminary result)   Collection Time: 09/14/19 10:22 PM   Specimen: BLOOD  Result Value Ref Range Status   Specimen Description BLOOD LEFT ANTECUBITAL  Final   Special Requests   Final    BOTTLES DRAWN AEROBIC AND ANAEROBIC Blood Culture adequate volume   Culture   Final    NO GROWTH < 12 HOURS Performed at [facility], [address], [place], [place] [address]    Report Status PENDING  Incomplete  SARS Coronavirus 2 by RT PCR (hospital  order, performed in [facility] hospital lab) Nasopharyngeal Nasopharyngeal Swab     Status: None   Collection Time: 09/14/19 10:40 PM   Specimen: Nasopharyngeal Swab  Result Value Ref Range Status   SARS Coronavirus 2 NEGATIVE NEGATIVE Final    Comment: (NOTE) SARS-CoV-2 target nucleic acids are NOT DETECTED. The SARS-CoV-2 RNA is generally detectable in upper and lower respiratory specimens during the acute phase of infection. The lowest concentration of SARS-CoV-2 viral copies this assay can detect is 250 copies / mL. A negative result does not preclude SARS-CoV-2 infection and should not be used as the sole basis for treatment or other patient management decisions.  A negative result may occur with improper specimen collection / handling, submission of specimen other than nasopharyngeal swab, presence of viral mutation(s) within the areas targeted by this assay, and inadequate number of viral copies (<250 copies / mL). A negative result must be combined with clinical observations, patient history, and epidemiological information. Fact Sheet for Patients:   StrictlyIdeas.no Fact Sheet for Healthcare Providers: BankingDealers.co.za This test is not yet approved or cleared  by the [place] FDA and has been authorized for detection and/or diagnosis of SARS-CoV-2 by FDA under an Emergency Use Authorization (EUA).  This EUA will remain in effect (meaning this test can be used)  for the duration of the COVID-19 declaration under Section 564(b)(1) of the Act, 21 U.S.C. section 360bbb-3(b)(1), unless the authorization is terminated or revoked sooner. Performed at [facility], [place] [address] [place], [place] [address]   Culture, blood (Routine x 2)     Status: None (Preliminary result)   Collection Time: 09/14/19 11:58 PM   Specimen: BLOOD LEFT HAND  Result Value Ref Range Status   Specimen Description BLOOD LEFT HAND  Final   Special Requests   Final    BOTTLES DRAWN AEROBIC ONLY Blood Culture adequate volume   Culture   Final    NO GROWTH < 12 HOURS Performed at [facility], [place] [address] [place], [place] [address]    Report Status PENDING  Incomplete    Radiology Reports DG Chest 2 View  Result Date: 09/14/2019 CLINICAL DATA:  Suspected sepsis.  Shortness of breath. EXAM: CHEST - 2 VIEW COMPARISON:  Remote radiograph 07/19/2005 FINDINGS: Mild cardiomegaly, stable. Unchanged mediastinal contours. Atherosclerosis of the thoracic aorta. No confluent airspace disease. No pleural fluid or pneumothorax. No pulmonary edema. Surgical hardware in the lower cervical spine. Chronic compression fracture in the lower thoracic spine, as seen on lumbar radiograph 02/17/2019. IMPRESSION: 1. No acute chest findings. 2. Stable mild cardiomegaly. Aortic Atherosclerosis (ICD10-I70.0). Electronically Signed   By: Keith Rake M.D.   On: 09/14/2019 22:47   DG Elbow 2 Views Right  Result Date: 08/19/2019 CLINICAL DATA:  Right elbow pain after a fall today. Initial encounter. EXAM: RIGHT ELBOW - 2 VIEW COMPARISON:  None. FINDINGS: The patient has an acute, simple transverse fracture of the distal humerus. The fracture extends from the superior aspect of the lateral epicondyle through the medial metaphysis just superior to the medial epicondyle. There is medial displacement of the distal fragment of approximately 0.7 cm and the fracture appears impacted. The articular  surface does not appear disrupted. The elbow is located. Associated soft tissue swelling noted. IMPRESSION: Acute transverse distal humerus fracture as described. Electronically Signed   By: Inge Rise M.D.   On: 08/19/2019 16:04   CT Head Wo Contrast  Result Date: 08/19/2019 CLINICAL DATA:  Fall.  Head trauma.  Headache. EXAM: CT HEAD WITHOUT CONTRAST TECHNIQUE: Contiguous axial images were obtained from the base of the skull through the vertex without intravenous contrast. COMPARISON:  None. FINDINGS: Brain: No evidence of acute infarction, hemorrhage, hydrocephalus, extra-axial collection or mass lesion/mass effect. Mild parietal atrophy bilaterally Vascular: Negative for hyperdense vessel Skull: Negative Sinuses/Orbits: Mucosal edema and bony thickening left maxillary sinus. Remaining sinuses clear. Bilateral cataract extraction. Other: None IMPRESSION: No acute abnormality. Electronically Signed   By: Franchot Gallo M.D.   On: 08/19/2019 17:01   CT Elbow Right Wo Contrast  Result Date: 08/19/2019 CLINICAL DATA:  Elbow pain, fracture EXAM: CT OF THE UPPER RIGHT EXTREMITY WITHOUT CONTRAST TECHNIQUE: Multidetector CT imaging of the upper right extremity was performed according to the standard protocol. COMPARISON:  Plain films today FINDINGS: As seen and described on earlier plain films, there is a distal right humeral fracture. The distal fragment is displaced anteriorly relative to the humeral shaft with overlapping fragments. No subluxation or dislocation. IMPRESSION: Displaced distal right humeral fracture with anterior displacement of the distal fracture fragment. Electronically Signed   By: Rolm Baptise M.D.   On: 08/19/2019 18:02   XR Elbow 2 Views Right  Result Date: 09/11/2019 Stable ORIF of the right distal humerus fracture without complication.  XR Elbow 2 Views Right  Result Date: 09/02/2019 Stable fixation alignment of distal humerus fracture and olecranon osteotomy   Lab  Data:  CBC: Recent Labs  Lab 09/14/19 2008 09/14/19 2219 09/15/19 0130  WBC 13.3* 13.2* 13.4*  NEUTROABS 9.5* 10.1* 10.2*  HGB 11.4* 11.4* 10.4*  HCT 35.0* 34.8* 31.7*  MCV 89.7 89.7 88.5  PLT 378 408* 761   Basic Metabolic Panel: Recent Labs  Lab 09/14/19 2008 09/15/19 0130  NA 127* 130*  K 4.4 3.6  CL 89* 95*  CO2 21* 21*  GLUCOSE 362* 144*  BUN 18 14  CREATININE 0.88 0.69  CALCIUM 9.2 8.7*   GFR: Estimated Creatinine Clearance: 63.9 mL/min (by C-G formula based on SCr of 0.69 mg/dL). Liver Function Tests: Recent Labs  Lab 09/14/19 2008 09/15/19 0130  AST 81* 79*  ALT 86* 83*  ALKPHOS 92 82  BILITOT 0.6 0.7  PROT 7.0 6.4*  ALBUMIN 2.2* 2.0*   No results for input(s): LIPASE, AMYLASE in the last 168 hours. No results for input(s): AMMONIA in the last 168 hours. Coagulation Profile: Recent Labs  Lab 09/14/19 2219  INR 1.3*   Cardiac Enzymes: No results for input(s): CKTOTAL, CKMB, CKMBINDEX, TROPONINI in the last 168 hours. BNP (last 3 results) No results for input(s): PROBNP in the last 8760 hours. HbA1C: Recent Labs    09/15/19 0130  HGBA1C 7.3*   CBG: Recent Labs  Lab 09/14/19 2002 09/15/19 0054 09/15/19 0349 09/15/19 0746  GLUCAP 362* 164* 97 140*   Lipid Profile: No results for input(s): CHOL, HDL, LDLCALC, TRIG, CHOLHDL, LDLDIRECT in the last 72 hours. Thyroid Function Tests: No results for input(s): TSH, T4TOTAL, FREET4, T3FREE, THYROIDAB in the last 72 hours. Anemia Panel: No results for input(s): VITAMINB12, FOLATE, FERRITIN, TIBC, IRON, RETICCTPCT in the last 72 hours. Urine analysis:    Component Value Date/Time   COLORURINE YELLOW 09/15/2019 0125   APPEARANCEUR CLEAR 09/15/2019 0125   LABSPEC 1.006 09/15/2019 0125   PHURINE 6.0 09/15/2019 0125   GLUCOSEU 50 (A) 09/15/2019 0125   HGBUR SMALL (A) 09/15/2019 0125   BILIRUBINUR NEGATIVE 09/15/2019 0125   KETONESUR NEGATIVE 09/15/2019 0125   PROTEINUR NEGATIVE 09/15/2019  0125  NITRITE NEGATIVE 09/15/2019 0125   LEUKOCYTESUR NEGATIVE 09/15/2019 0125     Hao Dion M.D. [facility] 09/15/2019, 10:09 AM   Call night coverage person covering after 7pm

## 2019-09-15 NOTE — H&P (Signed)

## 2019-09-15 NOTE — Progress Notes (Signed)
Report given to short stay nurse.

## 2019-09-15 NOTE — Progress Notes (Signed)
Pt arrived to the unit from ED; AxO X4; CHG bath is given; pt's RA is elevated; VS WDL (MEWS was yellow in the ED; green on 5N)

## 2019-09-15 NOTE — Anesthesia Preprocedure Evaluation (Addendum)
Anesthesia Evaluation  Patient identified by MRN, date of birth, ID band Patient awake    Reviewed: Allergy & Precautions, H&P , NPO status , Patient's Chart, lab work & pertinent test results  Airway Mallampati: II  TM Distance: >3 FB Neck ROM: Full    Dental no notable dental hx. (+) Edentulous Upper, Edentulous Lower   Pulmonary neg pulmonary ROS,    Pulmonary exam normal        Cardiovascular hypertension, Pt. on medications and Pt. on home beta blockers Normal cardiovascular exam     Neuro/Psych Depression negative neurological ROS     GI/Hepatic negative GI ROS, Neg liver ROS,   Endo/Other  diabetes, Type 2, Insulin Dependent, Oral Hypoglycemic Agents  Renal/GU negative Renal ROS  negative genitourinary   Musculoskeletal  (+) Arthritis , Osteoarthritis,    Abdominal   Peds  Hematology negative hematology ROS (+)   Anesthesia Other Findings   Reproductive/Obstetrics negative OB ROS                            Anesthesia Physical Anesthesia Plan  ASA: III  Anesthesia Plan: General   Post-op Pain Management:    Induction: Intravenous  PONV Risk Score and Plan: 4 or greater and Ondansetron and Treatment may vary due to age or medical condition  Airway Management Planned: Oral ETT  Additional Equipment:   Intra-op Plan:   Post-operative Plan: Extubation in OR  Informed Consent: I have reviewed the patients History and Physical, chart, labs and discussed the procedure including the risks, benefits and alternatives for the proposed anesthesia with the patient or authorized representative who has indicated his/her understanding and acceptance.     Dental advisory given  Plan Discussed with: CRNA  Anesthesia Plan Comments:         Anesthesia Quick Evaluation

## 2019-09-15 NOTE — Progress Notes (Signed)
Evaluate patient for right elbow drainage.  There is cellulitis and purulent drainage from surgical site.  Will plan for I&D in OR this afternoon.  NPO.  Treatment plan discussed with patient and she is in agreement to proceed.  Azucena Cecil, MD [facility] [phone] 9:50 AM

## 2019-09-15 NOTE — H&P (Signed)
History and Physical    Kayla Griffin NLZ:[id number] DOB: Dec 26, 1940 DOA: 09/14/2019  PCP: Aretta Nip, MD   Patient coming from: Home   Chief Complaint: Right arm redness, drainage, and pain   HPI: Kayla Griffin is a 79 y.o. female with medical history significant for insulin-dependent diabetes mellitus, hypertension, hyperlipidemia, endometrial cancer status post hysterectomy, and supracondylar right humerus fracture status post ORIF on 08/25/2019 now presenting with increasing redness around the surgical site and now drainage despite starting Augmentin a few days ago.  Patient underwent ORIF on the right humerus on 08/25/2019, developed some erythema at the site and increased pain, was seen by her orthopedic surgeon on 09/11/2019 and started on Augmentin at that time with concern for cellulitis.  Unfortunately, despite the Augmentin, condition is continued to worsen and she has developed drainage from the site.  She has some mild malaise and fatigue but has not noticed any fevers or chills.  She denies chest pain or palpitations and denies any new cough or shortness of breath.  ED Course: Upon arrival to the ED, patient is found to be afebrile, saturating well on room air, and with stable blood pressure.  Chest x-ray is negative for acute cardiopulmonary disease.  Chemistry panel notable for glucose of 362, sodium 127, albumin 2.2, and mild elevation in transaminases.  CBC features a leukocytosis to 13,300 and a mild normocytic anemia.  Lactic acid is elevated to 2.7.  Orthopedic surgery was consulted by the ED physician, will see the patient in consultation and recommends a medical admission.  Review of Systems:  All other systems reviewed and apart from HPI, are negative.  Past Medical History:  Diagnosis Date  . Ambulates with cane   . Arthritis   . Cancer [facility])    Endometrial - hysterectomy  . Depression   . Diabetes mellitus without complication ([place])    Type II  . Dyspnea     currently with arm pain 08/22/19  . Glaucoma   . HLD (hyperlipidemia)   . Hypertension   . Wears dentures     Past Surgical History:  Procedure Laterality Date  . ABDOMINAL HYSTERECTOMY    . CERVICAL FUSION  2007  . EYE SURGERY Bilateral    cataracts  . ORIF HUMERUS FRACTURE Right 08/25/2019   Procedure: OPEN REDUCTION INTERNAL FIXATION (ORIF) RIGHT SUPRACONDYLAR HUMERUS FRACTURE, NEUROLYSIS ULNAR NERVE;  Surgeon: Leandrew Koyanagi, MD;  Location: [place];  Service: Orthopedics;  Laterality: Right;  . ORIF PATELLA Right 07/07/2016   Procedure: Partial Patellectomy;  Surgeon: Leandrew Koyanagi, MD;  Location: [place];  Service: Orthopedics;  Laterality: Right;     reports that she has never smoked. She has never used smokeless tobacco. She reports that she does not drink alcohol or use drugs.  Allergies  Allergen Reactions  . Actos [Pioglitazone] Swelling    SWELLING REACTION UNSPECIFIED     Family History  Problem Relation Age of Onset  . CVA Mother   . Heart disease Father 84  . Heart attack Sister   . Heart disease Sister   . Arrhythmia Sister   . CVA Sister      Prior to Admission medications   Medication Sig Start Date End Date Taking? Authorizing Provider  alendronate (FOSAMAX) 70 MG tablet Take 70 mg by mouth every Sunday. Take with a full glass of water on an empty stomach.    [provider]  amoxicillin-clavulanate (AUGMENTIN) 875-125 MG tablet Take 1 tablet by mouth  2 (two) times daily. 09/11/19   Leandrew Koyanagi, MD  aspirin EC 325 MG tablet Take 1 tablet (325 mg total) by mouth daily. 07/07/16   Leandrew Koyanagi, MD  atorvastatin (LIPITOR) 40 MG tablet Take 40 mg by mouth daily.  05/16/16   [provider]  Calcium Carbonate-Vit D-Min (CALCIUM 1200 PO) Take 1,200 mg by mouth daily.    [provider]  calcium-vitamin D (OSCAL WITH D) 500-200 MG-UNIT tablet Take 1 tablet by mouth 3 (three) times daily. 08/25/19   Leandrew Koyanagi, MD  dorzolamide (TRUSOPT) 2 %  ophthalmic solution Place 1 drop into both eyes 2 (two) times daily.     [provider]  hydrochlorothiazide (HYDRODIURIL) 25 MG tablet Take 25 mg by mouth daily.     [provider]  insulin NPH-regular Human (NOVOLIN 70/30) (70-30) 100 UNIT/ML injection Inject 30 Units into the skin 2 (two) times daily with a meal.    [provider]  latanoprost (XALATAN) 0.005 % ophthalmic solution Place 1 drop into both eyes at bedtime.  06/15/16   [provider]  metFORMIN (GLUCOPHAGE) 500 MG tablet Take 500 mg by mouth 2 (two) times daily with a meal.    [provider]  methocarbamol (ROBAXIN) 750 MG tablet Take 1 tablet (750 mg total) by mouth 2 (two) times daily as needed for muscle spasms. 08/25/19   Leandrew Koyanagi, MD  metoprolol (LOPRESSOR) 100 MG tablet Take 100 mg by mouth 2 (two) times daily.    [provider]  ondansetron (ZOFRAN) 4 MG tablet Take 1-2 tablets (4-8 mg total) by mouth every 8 (eight) hours as needed for nausea or vomiting. 09/11/19   Leandrew Koyanagi, MD  oxyCODONE-acetaminophen (PERCOCET) 5-325 MG tablet Take 1 tablet by mouth every 6 (six) hours as needed. Patient taking differently: Take 1 tablet by mouth every 6 (six) hours as needed for moderate pain.  08/19/19   Drenda Freeze, MD  oxyCODONE-acetaminophen (PERCOCET) 5-325 MG tablet Take 1-2 tablets by mouth every 8 (eight) hours as needed for severe pain. 08/25/19   Leandrew Koyanagi, MD  ramipril (ALTACE) 10 MG capsule Take 20 mg by mouth daily.  05/16/16   [provider]    Physical Exam: Vitals:   09/14/19 2300 09/14/19 2345 09/15/19 0000 09/15/19 0015  BP: (!) 158/74 (!) 159/89 (!) 159/114 (!) 165/86  Pulse: 91 (!) 49  80  Resp: 20 15 (!) 24 (!) 22  Temp:      TempSrc:      SpO2: 99% 98%  99%  Weight:      Height:        Constitutional: NAD, calm  Eyes: PERTLA, lids and conjunctivae normal ENMT: Mucous membranes are moist. Posterior pharynx clear of any  exudate or lesions.   Neck: normal, supple, no masses, no thyromegaly Respiratory: clear to auscultation bilaterally, no wheezing, no crackles. No accessory muscle use.  Cardiovascular: S1 & S2 heard, regular rate and rhythm. No extremity edema.  Abdomen: No distension, no tenderness, soft. Bowel sounds active.  Musculoskeletal: no clubbing / cyanosis. No joint deformity upper and lower extremities.   Skin: Erythema and drainage from RUE. Warm, dry, well-perfused otherwise. Neurologic: No gross facial asymmetry. Sensation intact. Strength 5/5 in all 4 limbs.  Psychiatric: Alert and oriented to person, place, and situation. Pleasant and cooperative.    Labs and Imaging on Admission: I have personally reviewed following labs and imaging studies  CBC: Recent Labs  Lab 09/14/19 2008 09/14/19 2219  WBC 13.3* 13.2*  NEUTROABS 9.5* 10.1*  HGB 11.4* 11.4*  HCT 35.0* 34.8*  MCV 89.7 89.7  PLT 378 202*   Basic Metabolic Panel: Recent Labs  Lab 09/14/19 2008  NA 127*  K 4.4  CL 89*  CO2 21*  GLUCOSE 362*  BUN 18  CREATININE 0.88  CALCIUM 9.2   GFR: Estimated Creatinine Clearance: 59.6 mL/min (by C-G formula based on SCr of 0.88 mg/dL). Liver Function Tests: Recent Labs  Lab 09/14/19 2008  AST 81*  ALT 86*  ALKPHOS 92  BILITOT 0.6  PROT 7.0  ALBUMIN 2.2*   No results for input(s): LIPASE, AMYLASE in the last 168 hours. No results for input(s): AMMONIA in the last 168 hours. Coagulation Profile: Recent Labs  Lab 09/14/19 2219  INR 1.3*   Cardiac Enzymes: No results for input(s): CKTOTAL, CKMB, CKMBINDEX, TROPONINI in the last 168 hours. BNP (last 3 results) No results for input(s): PROBNP in the last 8760 hours. HbA1C: No results for input(s): HGBA1C in the last 72 hours. CBG: Recent Labs  Lab 09/14/19 2002  GLUCAP 362*   Lipid Profile: No results for input(s): CHOL, HDL, LDLCALC, TRIG, CHOLHDL, LDLDIRECT in the last 72 hours. Thyroid Function Tests: No  results for input(s): TSH, T4TOTAL, FREET4, T3FREE, THYROIDAB in the last 72 hours. Anemia Panel: No results for input(s): VITAMINB12, FOLATE, FERRITIN, TIBC, IRON, RETICCTPCT in the last 72 hours. Urine analysis: No results found for: COLORURINE, APPEARANCEUR, LABSPEC, PHURINE, GLUCOSEU, HGBUR, BILIRUBINUR, KETONESUR, PROTEINUR, UROBILINOGEN, NITRITE, LEUKOCYTESUR Sepsis Labs: @LABRCNTIP (procalcitonin:4,lacticidven:4) ) Recent Results (from the past 240 hour(s))  SARS Coronavirus 2 by RT PCR (hospital order, performed in [facility] hospital lab) Nasopharyngeal Nasopharyngeal Swab     Status: None   Collection Time: 09/14/19 10:40 PM   Specimen: Nasopharyngeal Swab  Result Value Ref Range Status   SARS Coronavirus 2 NEGATIVE NEGATIVE Final    Comment: (NOTE) SARS-CoV-2 target nucleic acids are NOT DETECTED. The SARS-CoV-2 RNA is generally detectable in upper and lower respiratory specimens during the acute phase of infection. The lowest concentration of SARS-CoV-2 viral copies this assay can detect is 250 copies / mL. A negative result does not preclude SARS-CoV-2 infection and should not be used as the sole basis for treatment or other patient management decisions.  A negative result may occur with improper specimen collection / handling, submission of specimen other than nasopharyngeal swab, presence of viral mutation(s) within the areas targeted by this assay, and inadequate number of viral copies (<250 copies / mL). A negative result must be combined with clinical observations, patient history, and epidemiological information. Fact Sheet for Patients:   StrictlyIdeas.no Fact Sheet for Healthcare Providers: BankingDealers.co.za This test is not yet approved or cleared  by the [place] FDA and has been authorized for detection and/or diagnosis of SARS-CoV-2 by FDA under an Emergency Use Authorization (EUA).  This EUA will  remain in effect (meaning this test can be used) for the duration of the COVID-19 declaration under Section 564(b)(1) of the Act, 21 U.S.C. section 360bbb-3(b)(1), unless the authorization is terminated or revoked sooner. Performed at [facility], [place] [address], [place], [place] [address]      Radiological Exams on Admission: DG Chest 2 View  Result Date: 09/14/2019 CLINICAL DATA:  Suspected sepsis.  Shortness of breath. EXAM: CHEST - 2 VIEW COMPARISON:  Remote radiograph 07/19/2005 FINDINGS: Mild cardiomegaly, stable. Unchanged mediastinal contours. Atherosclerosis of the thoracic aorta. No confluent airspace disease.  No pleural fluid or pneumothorax. No pulmonary edema. Surgical hardware in the lower cervical spine. Chronic compression fracture in the lower thoracic spine, as seen on lumbar radiograph 02/17/2019. IMPRESSION: 1. No acute chest findings. 2. Stable mild cardiomegaly. Aortic Atherosclerosis (ICD10-I70.0). Electronically Signed   By: Keith Rake M.D.   On: 09/14/2019 22:47    Assessment/Plan   1. Right upper extremity cellulitis  - Presents with progressive redness involving RUE and now drainage from site of ORIF performed 08/25/19 - She is afebrile but with leukocytosis and elevated lactate  - Orthopedic surgery is consulting and much appreciated  - Blood cultures have been collected and vancomycin started   - Follow cultures and clinical course, follow-up ortho recs    2. Insulin-dependent DM  - No recent A1c, serum glucose is 362 in ED  - Continue CBG checks and insulin   3. Hypertension  - BP at goal, continue ramipril and metoprolol as tolerated    4. Hyponatremia  - Serum sodium is 127 in setting of hypovolemia, corrects to 131 when accounting for hyperglycemia  - Hold HCTZ, continue gentle IVF hydration, repeat chem panel in am   DVT prophylaxis: SCDs  Code Status: Full  Family Communication: Discussed with patient  Disposition Plan:  Patient  is from: Home  Anticipated d/c is to: Home  Anticipated d/c date is: 09/18/19 Patient currently: Being started on IV antibiotics for worsening cellulitis despite oral antibiotic  Consults called: Orthopedic surgery  Admission status: Inpatient     [name], MD [facility] Pager: See [email or web address]  If 7AM-7PM, please contact the daytime attending [email or web address]  09/15/2019, 12:20 AM

## 2019-09-15 NOTE — Plan of Care (Signed)

## 2019-09-15 NOTE — Progress Notes (Signed)
Inpatient Diabetes Program Recommendations  AACE/ADA: New Consensus Statement on Inpatient Glycemic Control (2015)  Target Ranges:  Prepandial:   less than 140 mg/dL      Peak postprandial:   less than 180 mg/dL (1-2 hours)      Critically ill patients:  140 - 180 mg/dL   Lab Results  Component Value Date   GLUCAP 116 (H) 09/15/2019   HGBA1C 7.3 (H) 09/15/2019    Review of Glycemic Control Results for Kayla Griffin, Kayla Griffin (MRN [id number]) as of 09/15/2019 13:23  Ref. Range 09/15/2019 00:54 09/15/2019 03:49 09/15/2019 07:46 09/15/2019 11:52  Glucose-Capillary Latest Ref Range: 70 - 99 mg/dL 164 (H) 97 140 (H) 116 (H)   Diabetes history: Type 2 DM Outpatient Diabetes medications: Metformin 500 mg BID, Novolin 70/30 30 units BID Current orders for Inpatient glycemic control: Novolog 70/30 25 units BID, Novolog 0-9 units Q4H  Inpatient Diabetes Program Recommendations:    Patient is NPO for upcoming procedure.   Consider discontinuing Novolog 70/30. If glucose trends begin to exceed 180 mg/dL, could consider adding Lantus 10 units Q24H.   Thanks, Bronson Curb, MSN, RNC-OB Diabetes Coordinator [phone] (8a-5p)

## 2019-09-15 NOTE — Transfer of Care (Signed)
Immediate Anesthesia Transfer of Care Note  Patient: Kayla Griffin  Procedure(s) Performed: IRRIGATION AND DEBRIDEMENT ELBOW (Right )  Patient Location: PACU  Anesthesia Type:General  Level of Consciousness: awake and patient cooperative  Airway & Oxygen Therapy: Patient Spontanous Breathing and Patient connected to face mask oxygen  Post-op Assessment: Report given to RN and Post -op Vital signs reviewed and stable  Post vital signs: Reviewed and stable  Last Vitals:  Vitals Value Taken Time  BP    Temp    Pulse    Resp    SpO2      Last Pain:  Vitals:   09/15/19 0755  TempSrc: Oral  PainSc:          Complications: No apparent anesthesia complications

## 2019-09-15 NOTE — Progress Notes (Addendum)
     Subjective:   79 year old right handed female status post right supracondylar elbow fracture ORIF with plates and screws by Dr. Erlinda Hong 08/25/2019. She has been having increasing swelling and erythrema about the right elbow surgical incision site. History of Diabetes. She reports that she was seen by Dr. Erlinda Hong last Thursday 09/11/2019 and staples removed and at that time Dr. Erlinda Hong Started her on antibiotics. This weekend an area opened over the distal portion of the incision and began draining purulent weeping fluid. She called tonight and was told to proceed to the ER as it is likely that po antibiotics were not successfully treating the infection and she would require antibiotics IV and probable surgical debridement. She presented to [facility], laboratory shows acidosis, leukocytosis and elevated blood sugars consistent with localized and systemic reaction to infection. I requested admission to medical service due to diabetes, need for IV antibiotics. Dr. Erlinda Hong has been notified and will see Monday AM.   Patient reports pain as moderate.    Objective:   VITALS:  Temp:  [97.7 F (36.5 C)-98.3 F (36.8 C)] 97.7 F (36.5 C) (06/06 1953) Pulse Rate:  [44-94] 49 (06/06 2345) Resp:  [15-22] 15 (06/06 2345) BP: (107-164)/(57-136) 159/89 (06/06 2345) SpO2:  [96 %-100 %] 98 % (06/06 2345) Weight:  [97.1 kg] 97.1 kg (06/06 2004)  Neurologically intact ABD soft Neurovascular intact Sensation intact distally Intact pulses distally Incision: moderate drainage and seropurulent drainage that is steady weeping from the distal 1/3 of posterior elbow incision site, surrounding erythrema and inderation swelling includes arm distal to the incision.    LABS Recent Labs    09/14/19 2008 09/14/19 2219  HGB 11.4* 11.4*  WBC 13.3* 13.2*  PLT 378 408*   Recent Labs    09/14/19 2008  NA 127*  K 4.4  CL 89*  CO2 21*  BUN 18  CREATININE 0.88  GLUCOSE 362*   Recent Labs    09/14/19 2219  INR 1.3*      Assessment/Plan:   Post op surgical incision infection post right elbow supracondylar fracture ORIF 08/25/2019  May have diet till AM then NPO past 7 AM as she will likely need surgery. Dr.Xu schedule is In OR till afternoon. Elevate right arm on pillows to improve circulation and decrease swelling Continue ABX therapy due to Post-op infection Pain control IV antibiotics  Basil Dess 09/15/2019, 12:12 AMPatient ID: Kayla Griffin, female   DOB: 10/11/1940, 79 y.o.   MRN: [id number]

## 2019-09-15 NOTE — Plan of Care (Signed)
  Problem: Education: Goal: Knowledge of General Education information will improve Description: Including pain rating scale, medication(s)/side effects and non-pharmacologic comfort measures Outcome: Progressing   Problem: Activity: Goal: Risk for activity intolerance will decrease Outcome: Progressing   Problem: Pain Managment: Goal: General experience of comfort will improve Outcome: Progressing   

## 2019-09-15 NOTE — Op Note (Signed)
   Date of Surgery: 09/15/2019  INDICATIONS: Ms. Mand is a 79 y.o.-year-old female with a right elbow and upper arm abscess;  The patient did consent to the procedure after discussion of the risks and benefits.  PREOPERATIVE DIAGNOSIS: Right upper arm and elbow abscess  POSTOPERATIVE DIAGNOSIS: Same.  PROCEDURE:  1. Excisional debridement of right upper arm and elbow including skin, subcutaneous tissue, muscle, bone 325 cm 2.  Adjacent tissue rearrangement right upper extremity 8 cm  SURGEON: N. Eduard Roux, M.D.  ASSIST: None  ANESTHESIA:  general  IV FLUIDS AND URINE: See anesthesia.  ESTIMATED BLOOD LOSS: 50 mL.  IMPLANTS: None  DRAINS: None  COMPLICATIONS: see description of procedure.  DESCRIPTION OF PROCEDURE: The patient was brought to the operating room.  The patient had been signed prior to the procedure and this was documented. The patient had the anesthesia placed by the anesthesiologist.  A time-out was performed to confirm that this was the correct patient, site, side and location.  The patient had the operative extremity prepped and draped in the standard surgical fashion.   Antibiotics were given at their regularly scheduled intervals.  The previous surgical incision was incised and excisional debridement was performed with a knife and rondure from the skin all the way down to the bone.  There was a fair amount of fat necrosis as well especially in the upper arm on the lateral aspect.  The lateral portion of the plate was exposed without any muscle coverage.  Cultures were taken from the abscess pocket that had purulent drainage.  We continued the sharp excisional debridement throughout the upper arm and elbow region.  The fixation was solid.  After excisional debridement 6 L of normal saline was irrigated through the wound.  Hemostasis was obtained.  I then irrigated the wound with another liter of irrisept.  This was allowed to sit in the wound for over a minute.  This  was then suctioned out.  2 g of vancomycin powder was placed throughout the soft tissues.  Back cuts were then made in order to approximate the skin edges to me complications.  POSTOPERATIVE PLAN: She will need an infectious disease consult for recommendations on antibiotic treatment and duration.  Azucena Cecil, MD 8:56 PM

## 2019-09-15 NOTE — Anesthesia Procedure Notes (Signed)
Procedure Name: LMA Insertion Date/Time: 09/15/2019 4:01 PM Performed by: Renato Shin, CRNA Pre-anesthesia Checklist: Patient identified, Emergency Drugs available, Suction available and Patient being monitored Patient Re-evaluated:Patient Re-evaluated prior to induction Oxygen Delivery Method: Circle system utilized Preoxygenation: Pre-oxygenation with 100% oxygen Induction Type: IV induction LMA: LMA inserted LMA Size: 4.0 Number of attempts: 1 Airway Equipment and Method: Stylet and Oral airway Placement Confirmation: positive ETCO2 and breath sounds checked- equal and bilateral Tube secured with: Tape Dental Injury: Teeth and Oropharynx as per pre-operative assessment

## 2019-09-16 ENCOUNTER — Encounter: Payer: Self-pay | Admitting: *Deleted

## 2019-09-16 DIAGNOSIS — T8140XA Infection following a procedure, unspecified, initial encounter: Secondary | ICD-10-CM

## 2019-09-16 DIAGNOSIS — T8149XA Infection following a procedure, other surgical site, initial encounter: Secondary | ICD-10-CM

## 2019-09-16 DIAGNOSIS — T8140XD Infection following a procedure, unspecified, subsequent encounter: Secondary | ICD-10-CM

## 2019-09-16 LAB — COMPREHENSIVE METABOLIC PANEL
ALT: UNDETERMINED U/L (ref 0–44)
AST: UNDETERMINED U/L (ref 15–41)
Albumin: 1.8 g/dL — ABNORMAL LOW (ref 3.5–5.0)
Alkaline Phosphatase: 73 U/L (ref 38–126)
Anion gap: 13 (ref 5–15)
BUN: 13 mg/dL (ref 8–23)
CO2: 20 mmol/L — ABNORMAL LOW (ref 22–32)
Calcium: 8.2 mg/dL — ABNORMAL LOW (ref 8.9–10.3)
Chloride: 101 mmol/L (ref 98–111)
Creatinine, Ser: 0.6 mg/dL (ref 0.44–1.00)
GFR calc Af Amer: >60 mL/min (ref >60)
GFR calc non Af Amer: >60 mL/min (ref >60)
Glucose, Bld: 178 mg/dL — ABNORMAL HIGH (ref 70–99)
Potassium: 4.9 mmol/L (ref 3.5–5.1)
Sodium: 134 mmol/L — ABNORMAL LOW (ref 135–145)
Total Bilirubin: UNDETERMINED mg/dL (ref 0.3–1.2)
Total Protein: 5.7 g/dL — ABNORMAL LOW (ref 6.5–8.1)

## 2019-09-16 LAB — CBC WITH DIFFERENTIAL/PLATELET
Abs Immature Granulocytes: 0.14 10*3/uL — ABNORMAL HIGH (ref 0.00–0.07)
Basophils Absolute: 0 10*3/uL (ref 0.0–0.1)
Basophils Relative: 0 %
Eosinophils Absolute: 0 10*3/uL (ref 0.0–0.5)
Eosinophils Relative: 0 %
HCT: 31.3 % — ABNORMAL LOW (ref 36.0–46.0)
Hemoglobin: 10.2 g/dL — ABNORMAL LOW (ref 12.0–15.0)
Immature Granulocytes: 2 %
Lymphocytes Relative: 15 %
Lymphs Abs: 1.3 10*3/uL (ref 0.7–4.0)
MCH: 28.7 pg (ref 26.0–34.0)
MCHC: 32.6 g/dL (ref 30.0–36.0)
MCV: 87.9 fL (ref 80.0–100.0)
Monocytes Absolute: 0.4 10*3/uL (ref 0.1–1.0)
Monocytes Relative: 5 %
Neutro Abs: 6.8 10*3/uL (ref 1.7–7.7)
Neutrophils Relative %: 78 %
Platelets: 300 10*3/uL (ref 150–400)
RBC: 3.56 MIL/uL — ABNORMAL LOW (ref 3.87–5.11)
RDW: 13 % (ref 11.5–15.5)
WBC: 8.7 10*3/uL (ref 4.0–10.5)
nRBC: 0 % (ref 0.0–0.2)

## 2019-09-16 LAB — GLUCOSE, CAPILLARY
Glucose-Capillary: 173 mg/dL — ABNORMAL HIGH (ref 70–99)
Glucose-Capillary: 173 mg/dL — ABNORMAL HIGH (ref 70–99)
Glucose-Capillary: 174 mg/dL — ABNORMAL HIGH (ref 70–99)
Glucose-Capillary: 182 mg/dL — ABNORMAL HIGH (ref 70–99)
Glucose-Capillary: 223 mg/dL — ABNORMAL HIGH (ref 70–99)
Glucose-Capillary: 255 mg/dL — ABNORMAL HIGH (ref 70–99)
Glucose-Capillary: 268 mg/dL — ABNORMAL HIGH (ref 70–99)

## 2019-09-16 MED ORDER — SODIUM CHLORIDE 0.9 % IV SOLN
2.0000 g | INTRAVENOUS | Status: DC
  Administered 2019-09-16 – 2019-09-17 (×2): 2 g via INTRAVENOUS
  Filled 2019-09-16 (×2): qty 20

## 2019-09-16 NOTE — Anesthesia Postprocedure Evaluation (Signed)
Anesthesia Post Note  Patient: Kayla Griffin  Procedure(s) Performed: IRRIGATION AND DEBRIDEMENT ELBOW (Right )     Patient location during evaluation: PACU Anesthesia Type: General Level of consciousness: sedated Pain management: pain level controlled Vital Signs Assessment: post-procedure vital signs reviewed and stable Respiratory status: spontaneous breathing and respiratory function stable Cardiovascular status: stable Postop Assessment: no apparent nausea or vomiting Anesthetic complications: no    Last Vitals:  Vitals:   09/16/19 0318 09/16/19 0906  BP: (!) 151/74 (!) 141/69  Pulse: 73 71  Resp: 16 17  Temp: (!) 36.4 C 36.6 C  SpO2: 97% 95%    Last Pain:  Vitals:   09/16/19 0906  TempSrc: Oral  PainSc:                  George Haggart DANIEL

## 2019-09-16 NOTE — Progress Notes (Signed)
Per lab staff, lab draw quantity was not enough to complete CMET. Second CMET lab ordered to complete panel. Patient refused second lab draw. Day shift nurse updated.

## 2019-09-16 NOTE — Progress Notes (Signed)
[facility]                                                                              Patient Demographics  Kayla Griffin, is a 79 y.o. female, DOB - 10-Oct-1940, MGQ:[id number]  Admit date - 09/14/2019   Admitting Physician Kayla Bulls, MD  Outpatient Primary MD for the patient is Kayla Griffin, Kayla Salinas, MD  Outpatient specialists:   LOS - 2  days   Medical records reviewed and are as summarized below:    Chief Complaint  Patient presents with  . Post-op Problem       Brief summary   Patient is a 79 year old female with IDDM, hypertension, hyperlipidemia, endometrial CA status post hysterectomy, supracondylar right humerus fracture status post ORIF on 08/25/2019 presented with increasing redness around the surgical site, no drainage despite starting Augmentin a few days ago.  Patient underwent ORIF on right humerus on 08/25/2019, developed erythema at the site and increasing pain, was seen by her orthopedic surgeon on 6/3 and was started on Augmentin with concern for cellulitis.  Unfortunately her condition continued to worsen and she developed drainage from the site and presented to ED. WBCs 13.3, lactic acid 2.7, sodium 127 orthopedics was consulted, patient was admitted for further work-up  Assessment & Plan    Principal Problem:   Cellulitis of right upper extremity with purulent drainage -Presented with progressive erythema involving the surgical site, now draining, in setting of ORIF performed on 5/17 -Blood cultures negative so far, follow operative cultures, so far negative.   -Orthopedics consulted, underwent excisional debridement of the right upper arm and elbow on 6/7, postop day #1 -Continue IV vancomycin, follow cultures for sensitivities, will consult with ID for appropriate antibiotics, duration and dosing  Active Problems:   Hypertension -BP stable, continue ramipril, metoprolol   Lactic acidosis, hyponatremia -Resolved, patient was  placed on gentle IV fluid hydration -Sodium 127 at the time of admission, improving 134 -Encourage p.o. diet    Insulin-requiring or dependent type II diabetes mellitus ([place]) -Continue sliding scale insulin, patient is on NovoLog 70/30, continue 25 units twice daily (on 30 units twice daily at home)  Mildly elevated transaminases -Improving, follow labs  Pressure injury, POA Pressure Injury 09/15/19 Buttocks Left Stage 2 -  Partial thickness loss of dermis presenting as a shallow open injury with a red, pink wound bed without slough. (Active)  09/15/19 0100  Location: Buttocks  Location Orientation: Left  Staging: Stage 2 -  Partial thickness loss of dermis presenting as a shallow open injury with a red, pink wound bed without slough.  Wound Description (Comments):   Present on Admission: Yes    Obesity Estimated body mass index is 35 kg/m as calculated from the following:   Height as of this encounter: 5\' 4"  (1.626 m).   Weight as of this encounter: 92.5 kg.  Code Status: Full CODE STATUS DVT Prophylaxis: SCD's Family Communication: Discussed all imaging results, lab results, explained to the patient.  Discussed with sister yesterday.  Disposition Plan:     Status is: Inpatient  Remains inpatient appropriate because:Inpatient level of care appropriate due to severity  of illness currently on IV antibiotics, follow blood cultures, intraoperative cultures for sensitivities, may need ID consult.   Dispo: The patient is from: Home              Anticipated d/c is to: Home              Anticipated d/c date is: 2 days              Patient currently is not medically stable to d/c.  Time Spent in minutes  25 minutes  Procedures:  PROCEDURE:  1. Excisional debridement of right upper arm and elbow including skin, subcutaneous tissue, muscle, bone 325 cm 2.  Adjacent tissue rearrangement right upper extremity 8 cm  Consultants:   Orthopedics   Antimicrobials:    Anti-infectives (From admission, onward)   Start     Dose/Rate Route Frequency Ordered Stop   09/15/19 2300  vancomycin (VANCOREADY) IVPB 1500 mg/300 mL     1,500 mg 150 mL/hr over 120 Minutes Intravenous Every 24 hours 09/14/19 2332     09/15/19 1636  vancomycin (VANCOCIN) powder  Status:  Discontinued       As needed 09/15/19 1636 09/15/19 1656   09/14/19 2345  vancomycin (VANCOREADY) IVPB 2000 mg/400 mL     2,000 mg 200 mL/hr over 120 Minutes Intravenous  Once 09/14/19 2326 09/15/19 0217         Medications  Scheduled Meds: . insulin aspart  0-9 Units Subcutaneous Q4H  . metoprolol tartrate  100 mg Oral BID  . mupirocin ointment  1 application Nasal BID  . ramipril  20 mg Oral Daily  . sodium chloride flush  3 mL Intravenous Once   Continuous Infusions: . lactated ringers 10 mL/hr at 09/15/19 1440  . vancomycin 1,500 mg (09/15/19 2317)   PRN Meds:.acetaminophen **OR** acetaminophen, HYDROcodone-acetaminophen, morphine injection, ondansetron **OR** ondansetron (ZOFRAN) IV, oxyCODONE-acetaminophen, senna-docusate      Subjective:   Kayla Griffin was seen and examined today.  No complaints, pain in the right arm, currently controlled, 5/10, constant.  No fevers or chills.  Patient denies dizziness, chest pain, shortness of breath, abdominal pain, N/V/D/C, new weakness, numbess, tingling. No acute events overnight.    Objective:   Vitals:   09/15/19 1800 09/15/19 2009 09/16/19 0318 09/16/19 0906  BP: (!) 156/94 (!) 154/86 (!) 151/74 (!) 141/69  Pulse:  88 73 71  Resp:  16 16 17   Temp: (!) 97 F (36.1 C) (!) 97.5 F (36.4 C) (!) 97.5 F (36.4 C) 97.8 F (36.6 C)  TempSrc:  Oral Oral Oral  SpO2:  95% 97% 95%  Weight:      Height:        Intake/Output Summary (Last 24 hours) at 09/16/2019 1105 Last data filed at 09/16/2019 0500 Gross per 24 hour  Intake 1090 ml  Output 1250 ml  Net -160 ml     Wt Readings from Last 3 Encounters:  09/15/19 92.5 kg   09/11/19 97.1 kg  08/25/19 97.1 kg     Exam  General: Alert and oriented x 3, NAD  Cardiovascular: S1 S2 auscultated, no murmurs, RRR  Respiratory: Clear to auscultation bilaterally, no wheezing, rales or rhonchi  Gastrointestinal: Soft, nontender, nondistended, + bowel sounds  Ext: no pedal edema bilaterally, right arm dressing intact  Neuro: No new deficits  Musculoskeletal: No digital cyanosis, clubbing  Skin: No rashes  Psych: Normal affect and demeanor, alert and oriented x3    Data Reviewed:  I have personally  reviewed following labs and imaging studies  Micro Results Recent Results (from the past 240 hour(s))  Culture, blood (Routine x 2)     Status: None (Preliminary result)   Collection Time: 09/14/19 10:22 PM   Specimen: BLOOD  Result Value Ref Range Status   Specimen Description BLOOD LEFT ANTECUBITAL  Final   Special Requests   Final    BOTTLES DRAWN AEROBIC AND ANAEROBIC Blood Culture adequate volume   Culture   Final    NO GROWTH 2 DAYS Performed at [facility], [address], [place], [place] [address]    Report Status PENDING  Incomplete  SARS Coronavirus 2 by RT PCR (hospital order, performed in [facility] hospital lab) Nasopharyngeal Nasopharyngeal Swab     Status: None   Collection Time: 09/14/19 10:40 PM   Specimen: Nasopharyngeal Swab  Result Value Ref Range Status   SARS Coronavirus 2 NEGATIVE NEGATIVE Final    Comment: (NOTE) SARS-CoV-2 target nucleic acids are NOT DETECTED. The SARS-CoV-2 RNA is generally detectable in upper and lower respiratory specimens during the acute phase of infection. The lowest concentration of SARS-CoV-2 viral copies this assay can detect is 250 copies / mL. A negative result does not preclude SARS-CoV-2 infection and should not be used as the sole basis for treatment or other patient management decisions.  A negative result may occur with improper specimen collection / handling, submission of  specimen other than nasopharyngeal swab, presence of viral mutation(s) within the areas targeted by this assay, and inadequate number of viral copies (<250 copies / mL). A negative result must be combined with clinical observations, patient history, and epidemiological information. Fact Sheet for Patients:   StrictlyIdeas.no Fact Sheet for Healthcare Providers: BankingDealers.co.za This test is not yet approved or cleared  by the [place] FDA and has been authorized for detection and/or diagnosis of SARS-CoV-2 by FDA under an Emergency Use Authorization (EUA).  This EUA will remain in effect (meaning this test can be used) for the duration of the COVID-19 declaration under Section 564(b)(1) of the Act, 21 U.S.C. section 360bbb-3(b)(1), unless the authorization is terminated or revoked sooner. Performed at [facility], [place] [address], [place], [place] [address]   Culture, blood (Routine x 2)     Status: None (Preliminary result)   Collection Time: 09/14/19 11:58 PM   Specimen: BLOOD LEFT HAND  Result Value Ref Range Status   Specimen Description BLOOD LEFT HAND  Final   Special Requests   Final    BOTTLES DRAWN AEROBIC ONLY Blood Culture adequate volume   Culture   Final    NO GROWTH 2 DAYS Performed at [facility], [place] [address], [place], [place] [address]    Report Status PENDING  Incomplete  Aerobic/Anaerobic Culture (surgical/deep wound)     Status: None (Preliminary result)   Collection Time: 09/15/19  4:27 PM   Specimen: Other Source; Tissue  Result Value Ref Range Status   Specimen Description WOUND RIGHT ELBOW  Final   Special Requests NONE  Final   Gram Stain   Final    ABUNDANT WBC PRESENT, PREDOMINANTLY PMN NO ORGANISMS SEEN    Culture   Final    NO GROWTH < 24 HOURS Performed at [facility], [address], [place], [place] [address]    Report Status PENDING  Incomplete    Radiology  Reports DG Chest 2 View  Result Date: 09/14/2019 CLINICAL DATA:  Suspected sepsis.  Shortness of  breath. EXAM: CHEST - 2 VIEW COMPARISON:  Remote radiograph 07/19/2005 FINDINGS: Mild cardiomegaly, stable. Unchanged mediastinal contours. Atherosclerosis of the thoracic aorta. No confluent airspace disease. No pleural fluid or pneumothorax. No pulmonary edema. Surgical hardware in the lower cervical spine. Chronic compression fracture in the lower thoracic spine, as seen on lumbar radiograph 02/17/2019. IMPRESSION: 1. No acute chest findings. 2. Stable mild cardiomegaly. Aortic Atherosclerosis (ICD10-I70.0). Electronically Signed   By: Keith Rake M.D.   On: 09/14/2019 22:47   DG Elbow 2 Views Right  Result Date: 08/19/2019 CLINICAL DATA:  Right elbow pain after a fall today. Initial encounter. EXAM: RIGHT ELBOW - 2 VIEW COMPARISON:  None. FINDINGS: The patient has an acute, simple transverse fracture of the distal humerus. The fracture extends from the superior aspect of the lateral epicondyle through the medial metaphysis just superior to the medial epicondyle. There is medial displacement of the distal fragment of approximately 0.7 cm and the fracture appears impacted. The articular surface does not appear disrupted. The elbow is located. Associated soft tissue swelling noted. IMPRESSION: Acute transverse distal humerus fracture as described. Electronically Signed   By: Inge Rise M.D.   On: 08/19/2019 16:04   CT Head Wo Contrast  Result Date: 08/19/2019 CLINICAL DATA:  Fall.  Head trauma.  Headache. EXAM: CT HEAD WITHOUT CONTRAST TECHNIQUE: Contiguous axial images were obtained from the base of the skull through the vertex without intravenous contrast. COMPARISON:  None. FINDINGS: Brain: No evidence of acute infarction, hemorrhage, hydrocephalus, extra-axial collection or mass lesion/mass effect. Mild parietal atrophy bilaterally Vascular: Negative for hyperdense vessel Skull: Negative  Sinuses/Orbits: Mucosal edema and bony thickening left maxillary sinus. Remaining sinuses clear. Bilateral cataract extraction. Other: None IMPRESSION: No acute abnormality. Electronically Signed   By: Franchot Gallo M.D.   On: 08/19/2019 17:01   CT Elbow Right Wo Contrast  Result Date: 08/19/2019 CLINICAL DATA:  Elbow pain, fracture EXAM: CT OF THE UPPER RIGHT EXTREMITY WITHOUT CONTRAST TECHNIQUE: Multidetector CT imaging of the upper right extremity was performed according to the standard protocol. COMPARISON:  Plain films today FINDINGS: As seen and described on earlier plain films, there is a distal right humeral fracture. The distal fragment is displaced anteriorly relative to the humeral shaft with overlapping fragments. No subluxation or dislocation. IMPRESSION: Displaced distal right humeral fracture with anterior displacement of the distal fracture fragment. Electronically Signed   By: Rolm Baptise M.D.   On: 08/19/2019 18:02   XR Elbow 2 Views Right  Result Date: 09/11/2019 Stable ORIF of the right distal humerus fracture without complication.  XR Elbow 2 Views Right  Result Date: 09/02/2019 Stable fixation alignment of distal humerus fracture and olecranon osteotomy   Lab Data:  CBC: Recent Labs  Lab 09/14/19 2008 09/14/19 2219 09/15/19 0130 09/16/19 0337  WBC 13.3* 13.2* 13.4* 8.7  NEUTROABS 9.5* 10.1* 10.2* 6.8  HGB 11.4* 11.4* 10.4* 10.2*  HCT 35.0* 34.8* 31.7* 31.3*  MCV 89.7 89.7 88.5 87.9  PLT 378 408* 377 831   Basic Metabolic Panel: Recent Labs  Lab 09/14/19 2008 09/15/19 0130 09/16/19 0337  NA 127* 130* 134*  K 4.4 3.6 4.9  CL 89* 95* 101  CO2 21* 21* 20*  GLUCOSE 362* 144* 178*  BUN 18 14 13   CREATININE 0.88 0.69 0.60  CALCIUM 9.2 8.7* 8.2*   GFR: Estimated Creatinine Clearance: 63.9 mL/min (by C-G formula based on SCr of 0.6 mg/dL). Liver Function Tests: Recent Labs  Lab 09/14/19 2008 09/15/19 0130 09/16/19 5176  AST 81* 79* QUANTITY NOT  SUFFICIENT, UNABLE TO PERFORM TEST  ALT 86* 83* QUANTITY NOT SUFFICIENT, UNABLE TO PERFORM TEST  ALKPHOS 92 82 73  BILITOT 0.6 0.7 QUANTITY NOT SUFFICIENT, UNABLE TO PERFORM TEST  PROT 7.0 6.4* 5.7*  ALBUMIN 2.2* 2.0* 1.8*   No results for input(s): LIPASE, AMYLASE in the last 168 hours. No results for input(s): AMMONIA in the last 168 hours. Coagulation Profile: Recent Labs  Lab 09/14/19 2219  INR 1.3*   Cardiac Enzymes: No results for input(s): CKTOTAL, CKMB, CKMBINDEX, TROPONINI in the last 168 hours. BNP (last 3 results) No results for input(s): PROBNP in the last 8760 hours. HbA1C: Recent Labs    09/15/19 0130  HGBA1C 7.3*   CBG: Recent Labs  Lab 09/15/19 1711 09/15/19 2010 09/16/19 0032 09/16/19 0445 09/16/19 0812  GLUCAP 120* 181* 182* 173* 173*   Lipid Profile: No results for input(s): CHOL, HDL, LDLCALC, TRIG, CHOLHDL, LDLDIRECT in the last 72 hours. Thyroid Function Tests: No results for input(s): TSH, T4TOTAL, FREET4, T3FREE, THYROIDAB in the last 72 hours. Anemia Panel: No results for input(s): VITAMINB12, FOLATE, FERRITIN, TIBC, IRON, RETICCTPCT in the last 72 hours. Urine analysis:    Component Value Date/Time   COLORURINE YELLOW 09/15/2019 0125   APPEARANCEUR CLEAR 09/15/2019 0125   LABSPEC 1.006 09/15/2019 0125   PHURINE 6.0 09/15/2019 0125   GLUCOSEU 50 (A) 09/15/2019 0125   HGBUR SMALL (A) 09/15/2019 0125   BILIRUBINUR NEGATIVE 09/15/2019 0125   KETONESUR NEGATIVE 09/15/2019 0125   PROTEINUR NEGATIVE 09/15/2019 0125   NITRITE NEGATIVE 09/15/2019 0125   LEUKOCYTESUR NEGATIVE 09/15/2019 0125     Iziah Cates M.D. [facility] 09/16/2019, 11:05 AM   Call night coverage person covering after 7pm

## 2019-09-16 NOTE — Progress Notes (Addendum)
Inpatient Diabetes Program Recommendations  AACE/ADA: New Consensus Statement on Inpatient Glycemic Control (2015)  Target Ranges:  Prepandial:   less than 140 mg/dL      Peak postprandial:   less than 180 mg/dL (1-2 hours)      Critically ill patients:  140 - 180 mg/dL   Results for Kayla Griffin, Kayla Griffin (MRN [id number]) as of 09/16/2019 13:17  Ref. Range 09/16/2019 00:32 09/16/2019 04:45 09/16/2019 08:12 09/16/2019 11:52  Glucose-Capillary Latest Ref Range: 70 - 99 mg/dL 182 (H)  2 units NOVOLOG  173 (H)  2 units NOVOLOG  173 (H)  2 units NOVOLOG  268 (H)  5 units NOVOLOG    Results for Kayla Griffin, Kayla Griffin (MRN [id number]) as of 09/16/2019 13:17  Ref. Range 09/15/2019 01:30  Hemoglobin A1C Latest Ref Range: 4.8 - 5.6 % 7.3 (H)    Admit with: Cellulitis of right upper extremity with purulent drainage  History: DM  Home DM Meds: 70/30 Insulin 30 units BID       Metformin 500 mg BID  Current Orders: Novolog Sensitive Correction Scale/ SSI (0-9 units) Q4 hours    Patient received 4 mg Decadron X 1 dose yesterday at 4pm.      MD- CBG 268 at 12pm today.   Please consider the following:  1. Start low dose weight-based basal insulin: Lantus 9 units QHS  2. Change Novolog SSi to TID AC + HS (pt now allowed PO diet)  3. Start Novolog Meal Coverage: Novolog 4 units TID with meals  (Please add the following Hold Parameters: Hold if pt eats <50% of meal, Hold if pt NPO)  4. Can resume home 70/30 Insulin and Metformin at time of d/c home      --Will follow patient during hospitalization--  Wyn Quaker RN, MSN, CDE Diabetes Coordinator Inpatient Glycemic Control Team Team Pager: [phone] (8a-5p)

## 2019-09-16 NOTE — Consult Note (Signed)
[facility]       Reason for Consult: abscess    Referring Physician: Dr. Tana Coast  Principal Problem:   Abscess of right upper arm and forearm Active Problems:   Hypertension   Cellulitis of right upper extremity   Insulin-requiring or dependent type II diabetes mellitus ([place])   Hyponatremia   Postoperative infection   . insulin aspart  0-9 Units Subcutaneous Q4H  . metoprolol tartrate  100 mg Oral BID  . mupirocin ointment  1 application Nasal BID  . ramipril  20 mg Oral Daily  . sodium chloride flush  3 mL Intravenous Once    Recommendations: Continue with vancomycin pending any culture growth  Assessment: She recently underwent ORIF for a humerus fracture and developed a post op wound infection and required excisional debridement including skin, subcutaneous tissue, muslce and bone with pus.  Culture without any growth.   Will need to continue IV therapy 4-6 weeks.    Antibiotics: vancomycin  HPI: Kayla Griffin is a 79 y.o. female with DM, HTN, hyperlipidemia with recent humerus fracture requiring ORIF on 08/25/19 who came in with redness and swelling of the surgical site with drainage.  Had been on Augmentin empirically.  She has had some subjective fever.  No associated diarrhea.    Review of Systems:  Constitutional: negative for fevers and chills Gastrointestinal: negative for diarrhea All other systems reviewed and are negative    Past Medical History:  Diagnosis Date  . Ambulates with cane   . Arthritis   . Cancer [facility])    Endometrial - hysterectomy  . Depression   . Diabetes mellitus without complication ([place])    Type II  . Dyspnea    currently with arm pain 08/22/19  . Glaucoma   . HLD (hyperlipidemia)   . Hypertension   . Wears dentures     Social History   Tobacco Use  . Smoking status: Never Smoker  . Smokeless tobacco: Never Used  Substance Use Topics  . Alcohol use: No  . Drug use: No    Family History    Problem Relation Age of Onset  . CVA Mother   . Heart disease Father 43  . Heart attack Sister   . Heart disease Sister   . Arrhythmia Sister   . CVA Sister     Allergies  Allergen Reactions  . Actos [Pioglitazone] Swelling    SWELLING REACTION UNSPECIFIED     Physical Exam: Constitutional: in no apparent distress  Vitals:   09/16/19 0318 09/16/19 0906  BP: (!) 151/74 (!) 141/69  Pulse: 73 71  Resp: 16 17  Temp: (!) 97.5 F (36.4 C) 97.8 F (36.6 C)  SpO2: 97% 95%   EYES: anicteric Cardiovascular: Cor RRR Respiratory: clear; Musculoskeletal: peripheral pulses normal, no pedal edema, no clubbing or cyanosis, right arm wrapped Skin: negatives: no rash Hematologic: no cervical lad  Lab Results  Component Value Date   WBC 8.7 09/16/2019   HGB 10.2 (L) 09/16/2019   HCT 31.3 (L) 09/16/2019   MCV 87.9 09/16/2019   PLT 300 09/16/2019    Lab Results  Component Value Date   CREATININE 0.60 09/16/2019   BUN 13 09/16/2019   NA 134 (L) 09/16/2019   K 4.9 09/16/2019   CL 101 09/16/2019   CO2 20 (L) 09/16/2019    Lab Results  Component Value Date   ALT QUANTITY NOT SUFFICIENT, UNABLE TO PERFORM TEST 09/16/2019   AST QUANTITY NOT  SUFFICIENT, UNABLE TO PERFORM TEST 09/16/2019   ALKPHOS 73 09/16/2019     Microbiology: Recent Results (from the past 240 hour(s))  Culture, blood (Routine x 2)     Status: None (Preliminary result)   Collection Time: 09/14/19 10:22 PM   Specimen: BLOOD  Result Value Ref Range Status   Specimen Description BLOOD LEFT ANTECUBITAL  Final   Special Requests   Final    BOTTLES DRAWN AEROBIC AND ANAEROBIC Blood Culture adequate volume   Culture   Final    NO GROWTH 2 DAYS Performed at [facility], [address], [place], [place] [address]    Report Status PENDING  Incomplete  SARS Coronavirus 2 by RT PCR (hospital order, performed in [facility] hospital lab) Nasopharyngeal Nasopharyngeal Swab     Status: None   Collection Time:  09/14/19 10:40 PM   Specimen: Nasopharyngeal Swab  Result Value Ref Range Status   SARS Coronavirus 2 NEGATIVE NEGATIVE Final    Comment: (NOTE) SARS-CoV-2 target nucleic acids are NOT DETECTED. The SARS-CoV-2 RNA is generally detectable in upper and lower respiratory specimens during the acute phase of infection. The lowest concentration of SARS-CoV-2 viral copies this assay can detect is 250 copies / mL. A negative result does not preclude SARS-CoV-2 infection and should not be used as the sole basis for treatment or other patient management decisions.  A negative result may occur with improper specimen collection / handling, submission of specimen other than nasopharyngeal swab, presence of viral mutation(s) within the areas targeted by this assay, and inadequate number of viral copies (<250 copies / mL). A negative result must be combined with clinical observations, patient history, and epidemiological information. Fact Sheet for Patients:   StrictlyIdeas.no Fact Sheet for Healthcare Providers: BankingDealers.co.za This test is not yet approved or cleared  by the [place] FDA and has been authorized for detection and/or diagnosis of SARS-CoV-2 by FDA under an Emergency Use Authorization (EUA).  This EUA will remain in effect (meaning this test can be used) for the duration of the COVID-19 declaration under Section 564(b)(1) of the Act, 21 U.S.C. section 360bbb-3(b)(1), unless the authorization is terminated or revoked sooner. Performed at [facility], [place] [address] [place], [place] [address]   Culture, blood (Routine x 2)     Status: None (Preliminary result)   Collection Time: 09/14/19 11:58 PM   Specimen: BLOOD LEFT HAND  Result Value Ref Range Status   Specimen Description BLOOD LEFT HAND  Final   Special Requests   Final    BOTTLES DRAWN AEROBIC ONLY Blood Culture adequate volume   Culture   Final    NO GROWTH  2 DAYS Performed at [facility], [place] [address] [place], [place] [address]    Report Status PENDING  Incomplete  Aerobic/Anaerobic Culture (surgical/deep wound)     Status: None (Preliminary result)   Collection Time: 09/15/19  4:27 PM   Specimen: Other Source; Tissue  Result Value Ref Range Status   Specimen Description WOUND RIGHT ELBOW  Final   Special Requests NONE  Final   Gram Stain   Final    ABUNDANT WBC PRESENT, PREDOMINANTLY PMN NO ORGANISMS SEEN    Culture   Final    NO GROWTH < 24 HOURS Performed at [facility], [address], [place], [place] [address]    Report Status PENDING  Incomplete    Thayer Headings, [place] [facility] [email or web address] 09/16/2019, 3:30 PM

## 2019-09-16 NOTE — Progress Notes (Signed)
   Subjective:  Patient reports pain as mild.   Objective:   VITALS:   Vitals:   09/15/19 1745 09/15/19 1800 09/15/19 2009 09/16/19 0318  BP: (!) 144/78 (!) 156/94 (!) 154/86 (!) 151/74  Pulse: 90  88 73  Resp: 12  16 16   Temp: 97.6 F (36.4 C) (!) 97 F (36.1 C) (!) 97.5 F (36.4 C) (!) 97.5 F (36.4 C)  TempSrc:   Oral Oral  SpO2: 95%  95% 97%  Weight:      Height:        Neurovascular intact Sensation intact distally Intact pulses distally Incision: dressing C/D/I and no drainage Compartment soft   Lab Results  Component Value Date   WBC 8.7 09/16/2019   HGB 10.2 (L) 09/16/2019   HCT 31.3 (L) 09/16/2019   MCV 87.9 09/16/2019   PLT 300 09/16/2019     Assessment/Plan:  1 Day Post-Op   - cultures pending - will need ID consult for abx recommendations - NWB RUE, ROM ok with OT - continue IV abx   Eduard Roux 09/16/2019, 7:38 AM [phone]

## 2019-09-16 NOTE — Plan of Care (Signed)

## 2019-09-16 NOTE — Plan of Care (Signed)

## 2019-09-17 ENCOUNTER — Inpatient Hospital Stay: Payer: Self-pay

## 2019-09-17 LAB — GLUCOSE, CAPILLARY
Glucose-Capillary: 186 mg/dL — ABNORMAL HIGH (ref 70–99)
Glucose-Capillary: 184 mg/dL — ABNORMAL HIGH (ref 70–99)
Glucose-Capillary: 220 mg/dL — ABNORMAL HIGH (ref 70–99)
Glucose-Capillary: 151 mg/dL — ABNORMAL HIGH (ref 70–99)
Glucose-Capillary: 217 mg/dL — ABNORMAL HIGH (ref 70–99)

## 2019-09-17 MED ORDER — INSULIN GLARGINE 100 UNIT/ML ~~LOC~~ SOLN
9.0000 [IU] | Freq: Every day | SUBCUTANEOUS | Status: DC
  Administered 2019-09-17 – 2019-09-24 (×7): 9 [IU] via SUBCUTANEOUS
  Filled 2019-09-17 (×9): qty 0.09

## 2019-09-17 NOTE — Progress Notes (Addendum)
Pharmacy Antibiotic Note  Kayla Griffin is a 79 y.o. female admitted on 09/14/2019 with R arm cellulitis (s/p surgery).  Pharmacy has been consulted for Vancomycin dosing. Noted pt on Augmentin PTA since 6/3 Vancomycin 2000 mg x 1 then 1500 mg IV q24h initiated on 6/7 Ceftriaxone 2g IV q24h initiated on 6/8 PICC line to be placed ID service noted will need to continue IV therapy for 4-6 weeks  Plan: Continue vancomycin 1500 mg IV Q 24 hrs. Will f/u renal function, micro data, and pt's clinical condition Vancomycin trough level at steady state prior to tonight's dose  09/17/19 1445 Addendum: PICC line order cancelled and antibiotic plans changed to delbavancin/oritivancin prior to discharge then delbavancin q 7 days through home health or short stay and cephalexin 500 mg tid. [place] for discharge from ID standpoint.   In anticipation of discharge, will cancel vancomycin trough level.  Height: 5\' 4"  (162.6 cm) Weight: 92.5 kg (203 lb 14.8 oz) IBW/kg (Calculated) : 54.7  Temp (24hrs), Avg:97.9 F (36.6 C), Min:97.7 F (36.5 C), Max:98.1 F (36.7 C)  Recent Labs  Lab 09/14/19 2008 09/14/19 2219 09/15/19 0130 09/15/19 0450 09/16/19 0337  WBC 13.3* 13.2* 13.4*  --  8.7  CREATININE 0.88  --  0.69  --  0.60  LATICACIDVEN 2.3* 2.7* 2.4* 1.2  --     Estimated Creatinine Clearance: 63.9 mL/min (by C-G formula based on SCr of 0.6 mg/dL).    Allergies  Allergen Reactions  . Actos [Pioglitazone] Swelling    SWELLING REACTION UNSPECIFIED     Antimicrobials this admission: 6/7 Vanc >>  6/8 Ceftriaxone >>  Microbiology results: 6/6 BCx: NGTD 6/7 wound Cx: NGTD  Thank you for allowing pharmacy to be a part of this patient's care.  Shela Commons, PharmD, BCPS Please see amion for complete clinical pharmacist phone list 09/17/2019 11:18 AM

## 2019-09-17 NOTE — Progress Notes (Signed)
[facility] for Infectious Disease  Date of Admission:  09/14/2019     Total days of antibiotics 4         ASSESSMENT:  Ms. Helfman's surgical and blood cultures have been without growth to date. She remains on broad spectrum coverage for post-operative wound infection s/p excisional debridement with vancomycin and ceftriaxone. Discussed the ideal plan of care to include 6 weeks of IV antibiotics, however after discussion it does not appear that she will be able to complete the antibiotics independently and denies any friends/family that are able to help. Does not wish to go to a rehabilitation facility. We discussed that IV medication is the best treatment for her current infection. Plan of care is to treat with delbavancin q 7 days through home health or short stay and cephalexin 500 mg tid. This is not the most ideal, however will provide the broad coverage that is needed. We have discontinued the PICC line as it is no longer needed with the current plan. Recommend continued follow up with Dr. Erlinda Hong. Will arrange close follow up in ID office as well.   PLAN:  1. Continue current dose of vancomcyin and ceftriaxone.  2. Will arrange a dose of delbavancin/oritivancin prior to discharge.  3. At discharge start Cephalexin 500 mg qid. 4. Wound care per Dr. Erlinda Hong 5. Follow up in ID clinic.  30. [place] for discharge from ID standpoint.   Diagnosis:  Post op wound infection s/p debridement   Culture Result: Culture negative  Allergies  Allergen Reactions  . Actos [Pioglitazone] Swelling    SWELLING REACTION UNSPECIFIED     OPAT Orders Discharge antibiotics to be given via PICC line Discharge antibiotics: Delbavancin / Oritivancin Per pharmacy protocol   Duration: q 7 days for 4 doses  End Date: 10/27/19   [facility] Care Per Protocol:  Home health RN for IV administration and teaching; PICC line care and labs.    Clinic Follow Up Appt:  6/24 at 3:15 pm with Terri Piedra, NP   Principal  Problem:   Abscess of right upper arm and forearm Active Problems:   Hypertension   Cellulitis of right upper extremity   Insulin-requiring or dependent type II diabetes mellitus ([place])   Hyponatremia   Postoperative infection   . insulin aspart  0-9 Units Subcutaneous Q4H  . insulin glargine  9 Units Subcutaneous QHS  . metoprolol tartrate  100 mg Oral BID  . mupirocin ointment  1 application Nasal BID  . ramipril  20 mg Oral Daily  . sodium chloride flush  3 mL Intravenous Once    SUBJECTIVE:  Afebrile overnight with no acute events. Did not want lab work today because she is tired of needles. Discussed ability to complete IV antibiotics at home.   Allergies  Allergen Reactions  . Actos [Pioglitazone] Swelling    SWELLING REACTION UNSPECIFIED      Review of Systems: Review of Systems  Constitutional: Negative for chills, fever and weight loss.  Respiratory: Negative for cough, shortness of breath and wheezing.   Cardiovascular: Negative for chest pain and leg swelling.  Gastrointestinal: Negative for abdominal pain, constipation, diarrhea, nausea and vomiting.  Musculoskeletal:       Positive for right arm pain  Skin: Negative for rash.      OBJECTIVE: Vitals:   09/16/19 1547 09/16/19 1955 09/17/19 0423 09/17/19 0838  BP: 130/63 (!) 138/54 (!) 150/78 (!) 151/84  Pulse: 66 83 74 84  Resp: 18 16 17  17  Temp: 97.8 F (36.6 C) 97.7 F (36.5 C) 97.8 F (36.6 C) 98.1 F (36.7 C)  TempSrc: Oral Oral Oral Oral  SpO2: 96% 97% 94% 96%  Weight:      Height:       Body mass index is 35 kg/m.  Physical Exam Constitutional:      General: She is not in acute distress.    Appearance: She is well-developed.     Comments: Lying in bed with head of bed elevated.   Cardiovascular:     Rate and Rhythm: Normal rate and regular rhythm.     Heart sounds: Normal heart sounds.  Pulmonary:     Effort: Pulmonary effort is normal.     Breath sounds: Normal breath sounds.    Musculoskeletal:     Comments: Surgical dressing clean and dry. Distal pulses and sensation intact and appropriate.   Skin:    General: Skin is warm and dry.  Neurological:     Mental Status: She is alert and oriented to person, place, and time.  Psychiatric:        Mood and Affect: Mood normal.     Lab Results Lab Results  Component Value Date   WBC 8.7 09/16/2019   HGB 10.2 (L) 09/16/2019   HCT 31.3 (L) 09/16/2019   MCV 87.9 09/16/2019   PLT 300 09/16/2019    Lab Results  Component Value Date   CREATININE 0.60 09/16/2019   BUN 13 09/16/2019   NA 134 (L) 09/16/2019   K 4.9 09/16/2019   CL 101 09/16/2019   CO2 20 (L) 09/16/2019    Lab Results  Component Value Date   ALT QUANTITY NOT SUFFICIENT, UNABLE TO PERFORM TEST 09/16/2019   AST QUANTITY NOT SUFFICIENT, UNABLE TO PERFORM TEST 09/16/2019   ALKPHOS 73 09/16/2019   BILITOT QUANTITY NOT SUFFICIENT, UNABLE TO PERFORM TEST 09/16/2019     Microbiology: Recent Results (from the past 240 hour(s))  Culture, blood (Routine x 2)     Status: None (Preliminary result)   Collection Time: 09/14/19 10:22 PM   Specimen: BLOOD  Result Value Ref Range Status   Specimen Description BLOOD LEFT ANTECUBITAL  Final   Special Requests   Final    BOTTLES DRAWN AEROBIC AND ANAEROBIC Blood Culture adequate volume   Culture   Final    NO GROWTH 3 DAYS Performed at [facility], [address] [place], [place] [address]    Report Status PENDING  Incomplete  SARS Coronavirus 2 by RT PCR (hospital order, performed in [facility] hospital lab) Nasopharyngeal Nasopharyngeal Swab     Status: None   Collection Time: 09/14/19 10:40 PM   Specimen: Nasopharyngeal Swab  Result Value Ref Range Status   SARS Coronavirus 2 NEGATIVE NEGATIVE Final    Comment: (NOTE) SARS-CoV-2 target nucleic acids are NOT DETECTED. The SARS-CoV-2 RNA is generally detectable in upper and lower respiratory specimens during the acute phase of infection. The  lowest concentration of SARS-CoV-2 viral copies this assay can detect is 250 copies / mL. A negative result does not preclude SARS-CoV-2 infection and should not be used as the sole basis for treatment or other patient management decisions.  A negative result may occur with improper specimen collection / handling, submission of specimen other than nasopharyngeal swab, presence of viral mutation(s) within the areas targeted by this assay, and inadequate number of viral copies (<250 copies / mL). A negative result must be combined with clinical observations, patient history, and epidemiological  information. Fact Sheet for Patients:   StrictlyIdeas.no Fact Sheet for Healthcare Providers: BankingDealers.co.za This test is not yet approved or cleared  by the [place] FDA and has been authorized for detection and/or diagnosis of SARS-CoV-2 by FDA under an Emergency Use Authorization (EUA).  This EUA will remain in effect (meaning this test can be used) for the duration of the COVID-19 declaration under Section 564(b)(1) of the Act, 21 U.S.C. section 360bbb-3(b)(1), unless the authorization is terminated or revoked sooner. Performed at [facility], [place] [address] [place], [place] [address]   Culture, blood (Routine x 2)     Status: None (Preliminary result)   Collection Time: 09/14/19 11:58 PM   Specimen: BLOOD LEFT HAND  Result Value Ref Range Status   Specimen Description BLOOD LEFT HAND  Final   Special Requests   Final    BOTTLES DRAWN AEROBIC ONLY Blood Culture adequate volume   Culture   Final    NO GROWTH 3 DAYS Performed at [facility], [place] [address] [place], [place] [address]    Report Status PENDING  Incomplete  Aerobic/Anaerobic Culture (surgical/deep wound)     Status: None (Preliminary result)   Collection Time: 09/15/19  4:27 PM   Specimen: Other Source; Tissue  Result Value Ref Range Status   Specimen  Description WOUND RIGHT ELBOW  Final   Special Requests NONE  Final   Gram Stain   Final    ABUNDANT WBC PRESENT, PREDOMINANTLY PMN NO ORGANISMS SEEN    Culture   Final    NO GROWTH 2 DAYS NO ANAEROBES ISOLATED; CULTURE IN PROGRESS FOR 5 DAYS Performed at [facility], [address] [place], [place] [address]    Report Status PENDING  Incomplete     Terri Piedra, [facility]  09/17/2019  1:24 PM

## 2019-09-17 NOTE — Progress Notes (Signed)
[facility]  PROGRESS NOTE  Kayla Griffin KGU:[id number] DOB: December 11, 1940 DOA: 09/14/2019 PCP: Aretta Nip, MD   Brief HPI:   79 year old female with history of diabetes mellitus type 2, hypertension, hyperlipidemia, endometrial carcinoma status post hysterectomy, supracondylar right humerus fracture s/p ORIF on 08/25/2019 came with increasing redness around surgical site, no drainage despite starting Augmentin a few days ago.  She underwent ORIF on right humerus on 08/25/2019, developed erythema at the site increasing pain and was seen by orthopedic surgeon on 6 3.  She was started on Augmentin for concern for cellulitis.  Unfortunately condition worsen and she developed drainage from the site.  She required excision debridement including skin subcutaneous tissue muscle and bone with pus.  Culture showed no growth.  She was started on IV vancomycin.  ID was consulted for duration of treatment.    Subjective   Patient seen and examined, denies any complaints.   Assessment/Plan:    1. Cellulitis/abscess of right upper extremity-she presented with progressive erythema involving surgical site, draining in setting of ORIF performed on 08/25/2019.  Blood cultures were negative.  Orthopedics was consulted, she underwent excisional debridement of right upper arm and elbow on 09/15/2019.  Postop day #2.  She is currently on IV vancomycin, cultures are negative to date.  ID was consulted and recommended 4 to 6 weeks of IV vancomycin.  Will consult IV team for PICC line placement today. 2. Hypertension-blood pressure is stable, continue ramipril, metoprolol. 3. Lactic acidosis/hyponatremia-resolved, patient was placed on IV fluid hydration.  Sodium is 127 at the time of admission, improved to 134 as of 09/16/2019 4. Diabetes mellitus type 2-continue sliding scale insulin with NovoLog.  Patient is on NovoLog 70/30 25 units subcu twice daily. 5. Obesity-BMI 35 kg/m  Pressure Injury 09/15/19  Buttocks Left Stage 2 -  Partial thickness loss of dermis presenting as a shallow open injury with a red, pink wound bed without slough. (Active)  09/15/19 0100  Location: Buttocks  Location Orientation: Left  Staging: Stage 2 -  Partial thickness loss of dermis presenting as a shallow open injury with a red, pink wound bed without slough.  Wound Description (Comments):   Present on Admission: Yes        SpO2: 96 % O2 Flow Rate (L/min): 6 L/min   COVID-19 Labs  Recent Labs    09/15/19 1056  CRP 19.5*    Lab Results  Component Value Date   SARSCOV2NAA NEGATIVE 09/14/2019   [place] NEGATIVE 08/22/2019     CBG: Recent Labs  Lab 09/16/19 1627 09/16/19 1956 09/16/19 2356 09/17/19 0419 09/17/19 0731  GLUCAP 223* 255* 174* 186* 184*    CBC: Recent Labs  Lab 09/14/19 2008 09/14/19 2219 09/15/19 0130 09/16/19 0337  WBC 13.3* 13.2* 13.4* 8.7  NEUTROABS 9.5* 10.1* 10.2* 6.8  HGB 11.4* 11.4* 10.4* 10.2*  HCT 35.0* 34.8* 31.7* 31.3*  MCV 89.7 89.7 88.5 87.9  PLT 378 408* 377 628    Basic Metabolic Panel: Recent Labs  Lab 09/14/19 2008 09/15/19 0130 09/16/19 0337  NA 127* 130* 134*  K 4.4 3.6 4.9  CL 89* 95* 101  CO2 21* 21* 20*  GLUCOSE 362* 144* 178*  BUN 18 14 13   CREATININE 0.88 0.69 0.60  CALCIUM 9.2 8.7* 8.2*     Liver Function Tests: Recent Labs  Lab 09/14/19 2008 09/15/19 0130 09/16/19 0337  AST 81* 79* QUANTITY NOT SUFFICIENT, UNABLE TO PERFORM TEST  ALT 86* 83* QUANTITY NOT SUFFICIENT, UNABLE TO  PERFORM TEST  ALKPHOS 92 82 73  BILITOT 0.6 0.7 QUANTITY NOT SUFFICIENT, UNABLE TO PERFORM TEST  PROT 7.0 6.4* 5.7*  ALBUMIN 2.2* 2.0* 1.8*        DVT prophylaxis: SCDs  Code Status: Full code  Family Communication: No family at bedside    Status is: Inpatient  Dispo: The patient is from: Home              Anticipated d/c is to: Home              Anticipated d/c date is: 09/18/2019              Patient currently not  medically stable for discharge, will need PICC line, home health services arranged for antibiotics.  Barrier to [employer] placement today  Pressure Injury 09/15/19 Buttocks Left Stage 2 -  Partial thickness loss of dermis presenting as a shallow open injury with a red, pink wound bed without slough. (Active)  09/15/19 0100  Location: Buttocks  Location Orientation: Left  Staging: Stage 2 -  Partial thickness loss of dermis presenting as a shallow open injury with a red, pink wound bed without slough.  Wound Description (Comments):   Present on Admission: Yes       Scheduled medications:  . insulin aspart  0-9 Units Subcutaneous Q4H  . insulin glargine  9 Units Subcutaneous QHS  . metoprolol tartrate  100 mg Oral BID  . mupirocin ointment  1 application Nasal BID  . ramipril  20 mg Oral Daily  . sodium chloride flush  3 mL Intravenous Once    Consultants:  Orthopedics  Infectious disease  Procedures:  Excisional debridement of skin for right upper extremity abscess  Antibiotics:   Anti-infectives (From admission, onward)   Start     Dose/Rate Route Frequency Ordered Stop   09/16/19 1600  cefTRIAXone (ROCEPHIN) 2 g in sodium chloride 0.9 % 100 mL IVPB     2 g 200 mL/hr over 30 Minutes Intravenous Every 24 hours 09/16/19 1535     09/15/19 2300  vancomycin (VANCOREADY) IVPB 1500 mg/300 mL     1,500 mg 150 mL/hr over 120 Minutes Intravenous Every 24 hours 09/14/19 2332     09/15/19 1636  vancomycin (VANCOCIN) powder  Status:  Discontinued       As needed 09/15/19 1636 09/15/19 1656   09/14/19 2345  vancomycin (VANCOREADY) IVPB 2000 mg/400 mL     2,000 mg 200 mL/hr over 120 Minutes Intravenous  Once 09/14/19 2326 09/15/19 0217       Objective   Vitals:   09/16/19 1547 09/16/19 1955 09/17/19 0423 09/17/19 0838  BP: 130/63 (!) 138/54 (!) 150/78 (!) 151/84  Pulse: 66 83 74 84  Resp: 18 16 17 17   Temp: 97.8 F (36.6 C) 97.7 F (36.5 C) 97.8 F (36.6 C)  98.1 F (36.7 C)  TempSrc: Oral Oral Oral Oral  SpO2: 96% 97% 94% 96%  Weight:      Height:        Intake/Output Summary (Last 24 hours) at 09/17/2019 1220 Last data filed at 09/17/2019 1108 Gross per 24 hour  Intake 1100 ml  Output 650 ml  Net 450 ml    06/07 1901 - 06/09 0700 In: 1520 [P.O.:520] Out: 1600 [Urine:1600]  Filed Weights   09/14/19 2004 09/15/19 0316  Weight: 97.1 kg 92.5 kg    Physical Examination:    General: Appears in no acute distress  Cardiovascular: S1-S2, regular  Respiratory:  Clear to auscultation bilaterally, no wheezing or crackles auscultated  Abdomen: Abdomen is soft, nontender, no organomegaly  Extremities: No edema in the lower extremities, right upper extremity in dressing  Neurologic: Cranial nerves II through XII grossly intact, no focal deficit noted    Data Reviewed:   Recent Results (from the past 240 hour(s))  Culture, blood (Routine x 2)     Status: None (Preliminary result)   Collection Time: 09/14/19 10:22 PM   Specimen: BLOOD  Result Value Ref Range Status   Specimen Description BLOOD LEFT ANTECUBITAL  Final   Special Requests   Final    BOTTLES DRAWN AEROBIC AND ANAEROBIC Blood Culture adequate volume   Culture   Final    NO GROWTH 3 DAYS Performed at [facility], [address] [place], [place] [address]    Report Status PENDING  Incomplete  SARS Coronavirus 2 by RT PCR (hospital order, performed in [facility] hospital lab) Nasopharyngeal Nasopharyngeal Swab     Status: None   Collection Time: 09/14/19 10:40 PM   Specimen: Nasopharyngeal Swab  Result Value Ref Range Status   SARS Coronavirus 2 NEGATIVE NEGATIVE Final    Comment: (NOTE) SARS-CoV-2 target nucleic acids are NOT DETECTED. The SARS-CoV-2 RNA is generally detectable in upper and lower respiratory specimens during the acute phase of infection. The lowest concentration of SARS-CoV-2 viral copies this assay can detect is 250 copies / mL. A  negative result does not preclude SARS-CoV-2 infection and should not be used as the sole basis for treatment or other patient management decisions.  A negative result may occur with improper specimen collection / handling, submission of specimen other than nasopharyngeal swab, presence of viral mutation(s) within the areas targeted by this assay, and inadequate number of viral copies (<250 copies / mL). A negative result must be combined with clinical observations, patient history, and epidemiological information. Fact Sheet for Patients:   StrictlyIdeas.no Fact Sheet for Healthcare Providers: BankingDealers.co.za This test is not yet approved or cleared  by the [place] FDA and has been authorized for detection and/or diagnosis of SARS-CoV-2 by FDA under an Emergency Use Authorization (EUA).  This EUA will remain in effect (meaning this test can be used) for the duration of the COVID-19 declaration under Section 564(b)(1) of the Act, 21 U.S.C. section 360bbb-3(b)(1), unless the authorization is terminated or revoked sooner. Performed at [facility], [place] [address], [place], [place] [address]   Culture, blood (Routine x 2)     Status: None (Preliminary result)   Collection Time: 09/14/19 11:58 PM   Specimen: BLOOD LEFT HAND  Result Value Ref Range Status   Specimen Description BLOOD LEFT HAND  Final   Special Requests   Final    BOTTLES DRAWN AEROBIC ONLY Blood Culture adequate volume   Culture   Final    NO GROWTH 3 DAYS Performed at [facility], [place] [address], [place], [place] [address]    Report Status PENDING  Incomplete  Aerobic/Anaerobic Culture (surgical/deep wound)     Status: None (Preliminary result)   Collection Time: 09/15/19  4:27 PM   Specimen: Other Source; Tissue  Result Value Ref Range Status   Specimen Description WOUND RIGHT ELBOW  Final   Special Requests NONE  Final   Gram Stain   Final     ABUNDANT WBC PRESENT, PREDOMINANTLY PMN NO ORGANISMS SEEN    Culture   Final    NO GROWTH 2 DAYS NO ANAEROBES  ISOLATED; CULTURE IN PROGRESS FOR 5 DAYS Performed at [facility], [place] [address], [place], [place] [address]    Report Status PENDING  Incomplete      Oswald Hillock   [facility] If 7PM-7AM, please contact night-coverage at [email or web address], Office  [phone]   09/17/2019, 12:20 PM  LOS: 3 days

## 2019-09-17 NOTE — Progress Notes (Signed)
Patient refusing morning labs attempted to explain to patient the importance of the labs in order to give the doctor all the information he needs for effective care . She is adamant about not having her blood drawn.

## 2019-09-18 LAB — GLUCOSE, CAPILLARY
Glucose-Capillary: 169 mg/dL — ABNORMAL HIGH (ref 70–99)
Glucose-Capillary: 169 mg/dL — ABNORMAL HIGH (ref 70–99)
Glucose-Capillary: 194 mg/dL — ABNORMAL HIGH (ref 70–99)
Glucose-Capillary: 251 mg/dL — ABNORMAL HIGH (ref 70–99)
Glucose-Capillary: 275 mg/dL — ABNORMAL HIGH (ref 70–99)

## 2019-09-18 MED ORDER — DEXTROSE 5 % IV SOLN
1000.0000 mg | Freq: Once | INTRAVENOUS | Status: AC
  Administered 2019-09-18: 1000 mg via INTRAVENOUS
  Filled 2019-09-18: qty 50

## 2019-09-18 MED ORDER — HYDROCODONE-ACETAMINOPHEN 7.5-325 MG PO TABS: 1.0000 | ORAL_TABLET | Freq: Four times a day (QID) | ORAL | 0 refills | Status: DC | PRN

## 2019-09-18 MED ORDER — DEXTROSE 5 % IV SOLN
500.0000 mg | Freq: Once | INTRAVENOUS | Status: AC
  Administered 2019-09-18: 500 mg via INTRAVENOUS
  Filled 2019-09-18: qty 25

## 2019-09-18 MED ORDER — CEPHALEXIN 500 MG PO CAPS: 500.0000 mg | ORAL_CAPSULE | Freq: Four times a day (QID) | ORAL | 0 refills | Status: DC

## 2019-09-18 MED ORDER — DOXYCYCLINE MONOHYDRATE 100 MG PO TABS: 100.0000 mg | ORAL_TABLET | Freq: Two times a day (BID) | ORAL | 0 refills | Status: AC

## 2019-09-18 MED ORDER — CEPHALEXIN 500 MG PO CAPS: 500.0000 mg | ORAL_CAPSULE | Freq: Four times a day (QID) | ORAL | 0 refills | Status: AC

## 2019-09-18 NOTE — Progress Notes (Signed)
Pt is for discharge  but decided to stay and would go  to rehab; Primary MD aware and told this writer to order for  social work consult.

## 2019-09-18 NOTE — Discharge Summary (Signed)
Physician Discharge Summary  Kayla Griffin TIR:[id number] DOB: 02/06/1941 DOA: 09/14/2019  PCP: Kayla Nip, MD  Admit date: 09/14/2019 Discharge date: 09/18/2019  Time spent: 50 minutes  Recommendations for Outpatient Follow-up:  1. Follow-up orthopedics in 1 week 2. Patient will be discharged on Keflex doxycycline for 6 weeks   Discharge Diagnoses:  Principal Problem:   Abscess of right upper arm and forearm Active Problems:   Hypertension   Cellulitis of right upper extremity   Insulin-requiring or dependent type II diabetes mellitus ([place])   Hyponatremia   Postoperative infection   Discharge Condition: Stable  Diet recommendation: Heart healthy diet  Filed Weights   09/14/19 2004 09/15/19 0316  Weight: 97.1 kg 92.5 kg    History of present illness:  79 year old female with history of diabetes mellitus type 2, hypertension, hyperlipidemia, endometrial carcinoma status post hysterectomy, supracondylar right humerus fracture s/p ORIF on 08/25/2019 came with increasing redness around surgical site, no drainage despite starting Augmentin a few days ago.  She underwent ORIF on right humerus on 08/25/2019, developed erythema at the site increasing pain and was seen by orthopedic surgeon on 6 3.  She was started on Augmentin for concern for cellulitis.  Unfortunately condition worsen and she developed drainage from the site.  She required excision debridement including skin subcutaneous tissue muscle and bone with pus.  Culture showed no growth.  She was started on IV vancomycin.  ID was consulted for duration of treatment.   Hospital Course:  1. Cellulitis/abscess of right upper extremity-she presented with progressive erythema involving surgical site, draining in setting of ORIF performed on 08/25/2019.  Blood cultures were negative.  Orthopedics was consulted, she underwent excisional debridement of right upper arm and elbow on 09/15/2019.  Postop day #2.    She was started on  IV vancomycin and plan was to send her home on 6 weeks of IV antibiotic therapy.  However patient does not want IV therapy at home, also try to set up for IV dalbavancin once a week but patient cannot afford that.  So antibiotics have been changed to Keflex 500 mg 4 times a day till 10/26/2019 and doxycycline 100 mg p.o. twice daily till 10/26/2019.   2. Hypertension-blood pressure is stable, continue ramipril, metoprolol, hydrochlorothiazide. 3. Lactic acidosis/hyponatremia-resolved, patient was placed on IV fluid hydration.  Sodium is 127 at the time of admission, improved to 134 as of 09/16/2019 4. Diabetes mellitus type 2-continue sliding scale insulin with NovoLog.  Patient is on NovoLog 70/30 25 units subcu twice daily. 5. Obesity-BMI 35 kg/m contact tracing  Procedures:  Excision/debridement of right upper arm on 09/15/2019  Consultations:  Orthopedics  Infectious disease  Discharge Exam: Vitals:   09/18/19 0529 09/18/19 0740  BP: (!) 169/91 (!) 167/89  Pulse: 72 77  Resp: 16 17  Temp: 98.2 F (36.8 C) 98.9 F (37.2 C)  SpO2:  97%    General: Appears in no acute distress Cardiovascular: S1-S2, regular Respiratory: Clear to auscultation bilaterally  Discharge Instructions   Discharge Instructions    Diet - low sodium heart healthy   Complete by: As directed    Increase activity slowly   Complete by: As directed    Leave dressing on - Keep it clean, dry, and intact until clinic visit   Complete by: As directed      Allergies as of 09/18/2019      Reactions   Actos [pioglitazone] Swelling   SWELLING REACTION UNSPECIFIED  Medication List    STOP taking these medications   amoxicillin-clavulanate 875-125 MG tablet Commonly known as: Augmentin   oxyCODONE-acetaminophen 5-325 MG tablet Commonly known as: Percocet     TAKE these medications   alendronate 70 MG tablet Commonly known as: FOSAMAX Take 70 mg by mouth every Sunday. Take with a full glass of  water on an empty stomach.   aspirin EC 325 MG tablet Take 1 tablet (325 mg total) by mouth daily.   atorvastatin 40 MG tablet Commonly known as: LIPITOR Take 40 mg by mouth daily.   CALCIUM 1200 PO Take 1,200 mg by mouth daily.   cephALEXin 500 MG capsule Commonly known as: KEFLEX Take 1 capsule (500 mg total) by mouth 4 (four) times daily.   dorzolamide 2 % ophthalmic solution Commonly known as: TRUSOPT Place 1 drop into both eyes 2 (two) times daily.   doxycycline 100 MG tablet Commonly known as: ADOXA Take 1 tablet (100 mg total) by mouth 2 (two) times daily.   hydrochlorothiazide 25 MG tablet Commonly known as: HYDRODIURIL Take 25 mg by mouth daily.   HYDROcodone-acetaminophen 7.5-325 MG tablet Commonly known as: NORCO Take 1 tablet by mouth every 6 (six) hours as needed for up to 5 days for moderate pain.   insulin NPH-regular Human (70-30) 100 UNIT/ML injection Inject 30 Units into the skin 2 (two) times daily with a meal.   latanoprost 0.005 % ophthalmic solution Commonly known as: XALATAN Place 1 drop into both eyes at bedtime.   metFORMIN 500 MG tablet Commonly known as: GLUCOPHAGE Take 500 mg by mouth 2 (two) times daily with a meal.   methocarbamol 750 MG tablet Commonly known as: ROBAXIN Take 1 tablet (750 mg total) by mouth 2 (two) times daily as needed for muscle spasms.   metoprolol tartrate 100 MG tablet Commonly known as: LOPRESSOR Take 100 mg by mouth 2 (two) times daily.   ondansetron 4 MG tablet Commonly known as: ZOFRAN Take 1-2 tablets (4-8 mg total) by mouth every 8 (eight) hours as needed for nausea or vomiting.   ramipril 10 MG capsule Commonly known as: ALTACE Take 20 mg by mouth daily.            Discharge Care Instructions  (From admission, onward)         Start     Ordered   09/18/19 0000  Leave dressing on - Keep it clean, dry, and intact until clinic visit        06 /10/21 1200         Allergies  Allergen  Reactions  . Actos [Pioglitazone] Swelling    SWELLING REACTION UNSPECIFIED     Follow-up Information    [facility] Follow up.   Specialty: Home Health Services Why: Kayla Griffin will be providing home health physical therapy and occupational therapy for your home health services.  You should receive a call in the next 24-48 hours for services. Contact information: [place] [address] [phone]                The results of significant diagnostics from this hospitalization (including imaging, microbiology, ancillary and laboratory) are listed below for reference.    Significant Diagnostic Studies: DG Chest 2 View  Result Date: 09/14/2019 CLINICAL DATA:  Suspected sepsis.  Shortness of breath. EXAM: CHEST - 2 VIEW COMPARISON:  Remote radiograph 07/19/2005 FINDINGS: Mild cardiomegaly, stable. Unchanged mediastinal contours. Atherosclerosis of the thoracic aorta. No confluent airspace disease.  No pleural fluid or pneumothorax. No pulmonary edema. Surgical hardware in the lower cervical spine. Chronic compression fracture in the lower thoracic spine, as seen on lumbar radiograph 02/17/2019. IMPRESSION: 1. No acute chest findings. 2. Stable mild cardiomegaly. Aortic Atherosclerosis (ICD10-I70.0). Electronically Signed   By: Keith Rake M.D.   On: 09/14/2019 22:47   DG Elbow 2 Views Right  Result Date: 08/19/2019 CLINICAL DATA:  Right elbow pain after a fall today. Initial encounter. EXAM: RIGHT ELBOW - 2 VIEW COMPARISON:  None. FINDINGS: The patient has an acute, simple transverse fracture of the distal humerus. The fracture extends from the superior aspect of the lateral epicondyle through the medial metaphysis just superior to the medial epicondyle. There is medial displacement of the distal fragment of approximately 0.7 cm and the fracture appears impacted. The articular surface does not appear disrupted. The elbow is located. Associated soft  tissue swelling noted. IMPRESSION: Acute transverse distal humerus fracture as described. Electronically Signed   By: Inge Rise M.D.   On: 08/19/2019 16:04   CT Head Wo Contrast  Result Date: 08/19/2019 CLINICAL DATA:  Fall.  Head trauma.  Headache. EXAM: CT HEAD WITHOUT CONTRAST TECHNIQUE: Contiguous axial images were obtained from the base of the skull through the vertex without intravenous contrast. COMPARISON:  None. FINDINGS: Brain: No evidence of acute infarction, hemorrhage, hydrocephalus, extra-axial collection or mass lesion/mass effect. Mild parietal atrophy bilaterally Vascular: Negative for hyperdense vessel Skull: Negative Sinuses/Orbits: Mucosal edema and bony thickening left maxillary sinus. Remaining sinuses clear. Bilateral cataract extraction. Other: None IMPRESSION: No acute abnormality. Electronically Signed   By: Franchot Gallo M.D.   On: 08/19/2019 17:01   CT Elbow Right Wo Contrast  Result Date: 08/19/2019 CLINICAL DATA:  Elbow pain, fracture EXAM: CT OF THE UPPER RIGHT EXTREMITY WITHOUT CONTRAST TECHNIQUE: Multidetector CT imaging of the upper right extremity was performed according to the standard protocol. COMPARISON:  Plain films today FINDINGS: As seen and described on earlier plain films, there is a distal right humeral fracture. The distal fragment is displaced anteriorly relative to the humeral shaft with overlapping fragments. No subluxation or dislocation. IMPRESSION: Displaced distal right humeral fracture with anterior displacement of the distal fracture fragment. Electronically Signed   By: Rolm Baptise M.D.   On: 08/19/2019 18:02   XR Elbow 2 Views Right  Result Date: 09/11/2019 Stable ORIF of the right distal humerus fracture without complication.  XR Elbow 2 Views Right  Result Date: 09/02/2019 Stable fixation alignment of distal humerus fracture and olecranon osteotomy  [place] EKG SITE RITE  Result Date: 09/17/2019 If Site Rite image not attached,  placement could not be confirmed due to current cardiac rhythm.   Microbiology: Recent Results (from the past 240 hour(s))  Culture, blood (Routine x 2)     Status: None (Preliminary result)   Collection Time: 09/14/19 10:22 PM   Specimen: BLOOD  Result Value Ref Range Status   Specimen Description BLOOD LEFT ANTECUBITAL  Final   Special Requests   Final    BOTTLES DRAWN AEROBIC AND ANAEROBIC Blood Culture adequate volume   Culture   Final    NO GROWTH 4 DAYS Performed at [facility], [address], [place], [place] [address]    Report Status PENDING  Incomplete  SARS Coronavirus 2 by RT PCR (hospital order, performed in [facility] hospital lab) Nasopharyngeal Nasopharyngeal Swab     Status: None   Collection Time: 09/14/19 10:40 PM   Specimen: Nasopharyngeal Swab  Result Value Ref Range Status   SARS Coronavirus 2 NEGATIVE NEGATIVE Final    Comment: (NOTE) SARS-CoV-2 target nucleic acids are NOT DETECTED. The SARS-CoV-2 RNA is generally detectable in upper and lower respiratory specimens during the acute phase of infection. The lowest concentration of SARS-CoV-2 viral copies this assay can detect is 250 copies / mL. A negative result does not preclude SARS-CoV-2 infection and should not be used as the sole basis for treatment or other patient management decisions.  A negative result may occur with improper specimen collection / handling, submission of specimen other than nasopharyngeal swab, presence of viral mutation(s) within the areas targeted by this assay, and inadequate number of viral copies (<250 copies / mL). A negative result must be combined with clinical observations, patient history, and epidemiological information. Fact Sheet for Patients:   StrictlyIdeas.no Fact Sheet for Healthcare Providers: BankingDealers.co.za This test is not yet approved or cleared  by the [place] FDA and has been authorized for  detection and/or diagnosis of SARS-CoV-2 by FDA under an Emergency Use Authorization (EUA).  This EUA will remain in effect (meaning this test can be used) for the duration of the COVID-19 declaration under Section 564(b)(1) of the Act, 21 U.S.C. section 360bbb-3(b)(1), unless the authorization is terminated or revoked sooner. Performed at [facility], [place] [address], [place], [place] [address]   Culture, blood (Routine x 2)     Status: None (Preliminary result)   Collection Time: 09/14/19 11:58 PM   Specimen: BLOOD LEFT HAND  Result Value Ref Range Status   Specimen Description BLOOD LEFT HAND  Final   Special Requests   Final    BOTTLES DRAWN AEROBIC ONLY Blood Culture adequate volume   Culture   Final    NO GROWTH 4 DAYS Performed at [facility], [place] [address], [place], [place] [address]    Report Status PENDING  Incomplete  Aerobic/Anaerobic Culture (surgical/deep wound)     Status: None (Preliminary result)   Collection Time: 09/15/19  4:27 PM   Specimen: Other Source; Tissue  Result Value Ref Range Status   Specimen Description WOUND RIGHT ELBOW  Final   Special Requests NONE  Final   Gram Stain   Final    ABUNDANT WBC PRESENT, PREDOMINANTLY PMN NO ORGANISMS SEEN    Culture   Final    NO GROWTH 3 DAYS NO ANAEROBES ISOLATED; CULTURE IN PROGRESS FOR 5 DAYS Performed at [facility], [address], [place], [place] [address]    Report Status PENDING  Incomplete     Labs: Basic Metabolic Panel: Recent Labs  Lab 09/14/19 2008 09/15/19 0130 09/16/19 0337  NA 127* 130* 134*  K 4.4 3.6 4.9  CL 89* 95* 101  CO2 21* 21* 20*  GLUCOSE 362* 144* 178*  BUN 18 14 13   CREATININE 0.88 0.69 0.60  CALCIUM 9.2 8.7* 8.2*   Liver Function Tests: Recent Labs  Lab 09/14/19 2008 09/15/19 0130 09/16/19 0337  AST 81* 79* QUANTITY NOT SUFFICIENT, UNABLE TO PERFORM TEST  ALT 86* 83* QUANTITY NOT SUFFICIENT, UNABLE TO PERFORM TEST  ALKPHOS 92 82 73   BILITOT 0.6 0.7 QUANTITY NOT SUFFICIENT, UNABLE TO PERFORM TEST  PROT 7.0 6.4* 5.7*  ALBUMIN 2.2* 2.0* 1.8*   No results for input(s): LIPASE, AMYLASE in the last 168 hours. No results for input(s): AMMONIA in the last 168 hours. CBC: Recent Labs  Lab 09/14/19 2008 09/14/19 2219 09/15/19 0130 09/16/19 0337  WBC  13.3* 13.2* 13.4* 8.7  NEUTROABS 9.5* 10.1* 10.2* 6.8  HGB 11.4* 11.4* 10.4* 10.2*  HCT 35.0* 34.8* 31.7* 31.3*  MCV 89.7 89.7 88.5 87.9  PLT 378 408* 377 300    CBG: Recent Labs  Lab 09/17/19 2109 09/18/19 0017 09/18/19 0628 09/18/19 0742 09/18/19 1125  GLUCAP 217* 194* 169* 169* 251*     Signed:  Oswald Hillock MD.  [facility] 09/18/2019, 12:09 PM

## 2019-09-18 NOTE — TOC Initial Note (Signed)
Transition of Care [facility]) - Initial/Assessment Note    Patient Details  Name: Kayla Griffin MRN: [id number] Date of Birth: [birth year]  Transition of [facility] CM/SW Contact:    Curlene Labrum, RN Phone Number: 09/18/2019, 11:09 AM  Clinical Narrative:                 Case management met with the patient at the bedside, S/P I&D of Left humerus.  Patient lives alone with help of family members who live in [place].  Patient was declining SNF and IV antiobiotics - note from ID for plans for po antibiotics and F/U with ortho.  I spoke with the patient and patient was originally having outpatient PT - but patient will be unable to drive due to the infection and injury.  Patient given choice regarding home health - Patient prefers [facility] as first choice - unable to accommodate due to staffing issues with OT/PT.  Called [facility] - declined due to out of network.  Waiting on return call from [facility] for PT/OT.  Will follow.  Expected Discharge Plan: [place] Barriers to Discharge: No Barriers Identified   Patient Goals and CMS Choice Patient states their goals for this hospitalization and ongoing recovery are:: Patient would like to be discharged on po antibiotics and home health - refusing SNF placement and IV antibiotics. CMS Medicare.gov Compare Post Acute Care list provided to:: Patient Choice offered to / list presented to : Patient  Expected Discharge Plan and Services Expected Discharge Plan: [place]   Discharge Planning Services: CM Consult Post Acute Care Choice: [place] arrangements for the past 2 months: Apartment                           HH Arranged: PT, OT          Prior Living Arrangements/Services Living arrangements for the past 2 months: Apartment Lives with:: Self Patient language and need for interpreter reviewed:: Yes Do you feel safe going back to the place where you live?:  Yes      Need for Family Participation in Patient Care: Yes (Comment) Care giver support system in place?: Yes (comment)   Criminal Activity/Legal Involvement Pertinent to Current Situation/Hospitalization: No - Comment as needed  Activities of Daily Living Home Assistive Devices/Equipment: Blood pressure cuff, CBG Meter ADL Screening (condition at time of admission) Patient's cognitive ability adequate to safely complete daily activities?: Yes Is the patient deaf or have difficulty hearing?: Yes Does the patient have difficulty seeing, even when wearing glasses/contacts?: No Does the patient have difficulty concentrating, remembering, or making decisions?: No Patient able to express need for assistance with ADLs?: Yes Does the patient have difficulty dressing or bathing?: Yes Independently performs ADLs?: No Does the patient have difficulty walking or climbing stairs?: No Weakness of Legs: None Weakness of Arms/Hands: Right  Permission Sought/Granted Permission sought to share information with : Case Manager Permission granted to share information with : Yes, Verbal Permission Granted     Permission granted to share info w AGENCY: Home Health Agencies        Emotional Assessment Appearance:: Appears stated age Attitude/Demeanor/Rapport: Engaged Affect (typically observed): Accepting Orientation: : Oriented to Self, Oriented to Place, Oriented to  Time, Oriented to Situation Alcohol / Substance Use: Not Applicable Psych Involvement: No (comment)  Admission diagnosis:  Cellulitis of right upper extremity [L03.113] Patient Active Problem  List   Diagnosis Date Noted  . Postoperative infection   . Abscess of right upper arm and forearm 09/15/2019  . Cellulitis of right upper extremity 09/14/2019  . Insulin-requiring or dependent type II diabetes mellitus ([place]) 09/14/2019  . Hyponatremia   . Right supracondylar humerus fracture, closed, initial encounter 08/20/2019  . Abnormal  EKG 08/10/2016  . Hypertension 08/10/2016   PCP:  Aretta Nip, MD Pharmacy:   [place], [place] [address] Phone: [phone] Fax: [place], [place] - [address] [place] [address] Phone: [phone] Fax: [phone]     Social Determinants of Health ([place]) Interventions    Readmission Risk Interventions Readmission Risk Prevention Plan 09/18/2019  Transportation Screening Complete  PCP or Specialist Appt within 5-7 Days Complete  Home Care Screening Complete  Medication Review (RN CM) Complete  Some recent data might be hidden

## 2019-09-18 NOTE — Progress Notes (Signed)
Pharmacy Antibiotic Note  Kayla Griffin is a 79 y.o. female admitted on 09/14/2019 with R arm cellulitis (s/p surgery).  Pharmacy was consulted for Vancomycin and ceftriaxone dosing for culture negative osteomyelitis.   She is not capable of doing IV therapy herself as an outpatient and she does not have any support to help her. Therefore ID had originally planned to give her Dalbavancin before she leaves the hospital and then continue it weekly x4 doses, in conjunction with cephalexin. Unfortunately her copay for Dalbavancin is $500 a dose, so the alternative plan now is to give a dose of 1500 mg today before she goes home and plan to continue oral therapy to complete 6 weeks.   Her oral discharge medication have been pended so whenever she is ready to go they will just need to be signed and sent to the pharmacy. She does not want to fill with our [place].  Plan: Continue ceftriaxone until discharge Dalbavancin 1500 mg x1 dose Doxycycline and Cephalexin to complete 6 weeks end date 7/18   Height: 5\' 4"  (162.6 cm) Weight: 92.5 kg (203 lb 14.8 oz) IBW/kg (Calculated) : 54.7  Temp (24hrs), Avg:98.4 F (36.9 C), Min:98.1 F (36.7 C), Max:98.9 F (37.2 C)  Recent Labs  Lab 09/14/19 2008 09/14/19 2219 09/15/19 0130 09/15/19 0450 09/16/19 0337  WBC 13.3* 13.2* 13.4*  --  8.7  CREATININE 0.88  --  0.69  --  0.60  LATICACIDVEN 2.3* 2.7* 2.4* 1.2  --     Estimated Creatinine Clearance: 63.9 mL/min (by C-G formula based on SCr of 0.6 mg/dL).    Allergies  Allergen Reactions  . Actos [Pioglitazone] Swelling    SWELLING REACTION UNSPECIFIED     Antimicrobials this admission: 6/7 Vanc >> 6/9 6/8 Ceftriaxone >> 6/10 [place] x1   Microbiology results: 6/6 BCx: NGTD 6/7 wound Cx: NGTD  Thank you for allowing pharmacy to be a part of this patient's care.  Nicoletta Dress, PharmD PGY2 Infectious Disease Pharmacy Resident  Please see amion for complete clinical pharmacist  phone list 09/18/2019 11:20 AM

## 2019-09-19 LAB — CULTURE, BLOOD (ROUTINE X 2)
Culture: NO GROWTH
Culture: NO GROWTH
Special Requests: ADEQUATE
Special Requests: ADEQUATE

## 2019-09-19 LAB — GLUCOSE, CAPILLARY
Glucose-Capillary: 152 mg/dL — ABNORMAL HIGH (ref 70–99)
Glucose-Capillary: 176 mg/dL — ABNORMAL HIGH (ref 70–99)
Glucose-Capillary: 194 mg/dL — ABNORMAL HIGH (ref 70–99)
Glucose-Capillary: 206 mg/dL — ABNORMAL HIGH (ref 70–99)
Glucose-Capillary: 219 mg/dL — ABNORMAL HIGH (ref 70–99)
Glucose-Capillary: 299 mg/dL — ABNORMAL HIGH (ref 70–99)
Glucose-Capillary: 304 mg/dL — ABNORMAL HIGH (ref 70–99)

## 2019-09-19 NOTE — TOC Transition Note (Addendum)
Transition of Care [facility]) - CM/SW Discharge Note   Patient Details  Name: Kayla Griffin MRN: [id number] Date of Birth: 1941/03/25  [facility] CM/SW Contact:  Curlene Labrum, RN Phone Number: 09/19/2019, 11:24 AM   Clinical Narrative:    Case management was notified that the patient did not feel secure about getting discharged with home health last evening and was requesting SNF work up.  SNF work up was started.  I will offer Medicare choice to the patient regarding options for bed placement.  Patient given choice regarding SNF placement and patient chose 1. [facility] - no in network with insurance, and 2, [facility] - left message with facility for patient's choice.  Final next level of care: [place] Barriers to Discharge: No Barriers Identified   Patient Goals and CMS Choice Patient states their goals for this hospitalization and ongoing recovery are:: Patient would like to be discharged on po antibiotics and home health - refusing SNF placement and IV antibiotics. CMS Medicare.gov Compare Post Acute Care list provided to:: Patient Choice offered to / list presented to : Patient  Discharge Placement                       Discharge Plan and Services   Discharge Planning Services: CM Consult Post Acute Care Choice: Home Health                    HH Arranged: PT, OT [place] Agency: [place]        Social Determinants of Health ([place]) Interventions     Readmission Risk Interventions Readmission Risk Prevention Plan 09/18/2019  Transportation Screening Complete  PCP or Specialist Appt within 5-7 Days Complete  Home Care Screening Complete  Medication Review (RN CM) Complete  Some recent data might be hidden

## 2019-09-19 NOTE — Progress Notes (Signed)
[facility]  PROGRESS NOTE  Kayla Griffin WPY:[id number] DOB: 09/29/1940 DOA: 09/14/2019 PCP: Aretta Nip, MD   Brief HPI:   79 year old female with history of diabetes mellitus type 2, hypertension, hyperlipidemia, endometrial carcinoma status post hysterectomy, supracondylar right humerus fracture s/p ORIF on 08/25/2019 came with increasing redness around surgical site, no drainage despite starting Augmentin a few days ago.  She underwent ORIF on right humerus on 08/25/2019, developed erythema at the site increasing pain and was seen by orthopedic surgeon on 6 3.  She was started on Augmentin for concern for cellulitis.  Unfortunately condition worsen and she developed drainage from the site.  She required excision debridement including skin subcutaneous tissue muscle and bone with pus.  Culture showed no growth.  She was started on IV vancomycin.  ID was consulted for duration of treatment.    Subjective   Patient seen and examined, she was discharged home yesterday however she did not go because she felt very weak.  Earlier she had refused to go to skilled nursing facility, now agreeable to go to skilled nursing facility for rehab.  Discharge was canceled.   Assessment/Plan:    1. Cellulitis/abscess of right upper extremity-she presented with progressive erythema involving surgical site, draining in setting of ORIF performed on 08/25/2019.  Blood cultures were negative.  Orthopedics was consulted, she underwent excisional debridement of right upper arm and elbow on 09/15/2019.  She was started on vancomycin and plan was to send her home on 6 weeks of IV antibiotics however patient did not want IV therapy at home and also could not afford IV dalbavancin once a week.  So antibiotics were changed to Keflex 500 mg 4 times a day and doxycycline 100 g p.o. twice daily for 6 weeks.  Stop date 10/26/2019.  2. Hypertension-blood pressure is stable, continue ramipril, metoprolol. 3. Lactic  acidosis/hyponatremia-resolved, patient was placed on IV fluid hydration.  Sodium was 127 at the time of admission, improved to 134 as of 09/16/2019 4. Diabetes mellitus type 2-continue sliding scale insulin with NovoLog.  Patient is on NovoLog 70/30 25 units subcu twice daily. 5. Obesity-BMI 35 kg/m  Pressure Injury 09/15/19 Buttocks Left Stage 2 -  Partial thickness loss of dermis presenting as a shallow open injury with a red, pink wound bed without slough. (Active)  09/15/19 0100  Location: Buttocks  Location Orientation: Left  Staging: Stage 2 -  Partial thickness loss of dermis presenting as a shallow open injury with a red, pink wound bed without slough.  Wound Description (Comments):   Present on Admission: Yes        SpO2: 94 % O2 Flow Rate (L/min): 6 L/min   COVID-19 Labs  No results for input(s): DDIMER, FERRITIN, LDH, CRP in the last 72 hours.  Lab Results  Component Value Date   [place] NEGATIVE 09/14/2019   [place] NEGATIVE 08/22/2019     CBG: Recent Labs  Lab 09/18/19 2011 09/18/19 2355 09/19/19 0406 09/19/19 0735 09/19/19 1119  GLUCAP 275* 304* 194* 206* 299*    CBC: Recent Labs  Lab 09/14/19 2008 09/14/19 2219 09/15/19 0130 09/16/19 0337  WBC 13.3* 13.2* 13.4* 8.7  NEUTROABS 9.5* 10.1* 10.2* 6.8  HGB 11.4* 11.4* 10.4* 10.2*  HCT 35.0* 34.8* 31.7* 31.3*  MCV 89.7 89.7 88.5 87.9  PLT 378 408* 377 053    Basic Metabolic Panel: Recent Labs  Lab 09/14/19 2008 09/15/19 0130 09/16/19 0337  NA 127* 130* 134*  K 4.4 3.6 4.9  CL  89* 95* 101  CO2 21* 21* 20*  GLUCOSE 362* 144* 178*  BUN 18 14 13   CREATININE 0.88 0.69 0.60  CALCIUM 9.2 8.7* 8.2*     Liver Function Tests: Recent Labs  Lab 09/14/19 2008 09/15/19 0130 09/16/19 0337  AST 81* 79* QUANTITY NOT SUFFICIENT, UNABLE TO PERFORM TEST  ALT 86* 83* QUANTITY NOT SUFFICIENT, UNABLE TO PERFORM TEST  ALKPHOS 92 82 73  BILITOT 0.6 0.7 QUANTITY NOT SUFFICIENT, UNABLE TO PERFORM  TEST  PROT 7.0 6.4* 5.7*  ALBUMIN 2.2* 2.0* 1.8*        DVT prophylaxis: SCDs  Code Status: Full code  Family Communication: No family at bedside    Status is: Inpatient  Dispo: The patient is from: Home              Anticipated d/c is to: Home              Anticipated d/c date is: 09/20/2019             Patient is medically stable for discharge  Barrier to discharge-awaiting bed at skilled nursing facility  Pressure Injury 09/15/19 Buttocks Left Stage 2 -  Partial thickness loss of dermis presenting as a shallow open injury with a red, pink wound bed without slough. (Active)  09/15/19 0100  Location: Buttocks  Location Orientation: Left  Staging: Stage 2 -  Partial thickness loss of dermis presenting as a shallow open injury with a red, pink wound bed without slough.  Wound Description (Comments):   Present on Admission: Yes       Scheduled medications:  . insulin aspart  0-9 Units Subcutaneous Q4H  . insulin glargine  9 Units Subcutaneous QHS  . metoprolol tartrate  100 mg Oral BID  . mupirocin ointment  1 application Nasal BID  . ramipril  20 mg Oral Daily  . sodium chloride flush  3 mL Intravenous Once    Consultants:  Orthopedics  Infectious disease  Procedures:  Excisional debridement of skin for right upper extremity abscess  Antibiotics:   Anti-infectives (From admission, onward)   Start     Dose/Rate Route Frequency Ordered Stop   09/18/19 1130  dalbavancin (DALVANCE) 500 mg in dextrose 5 % 250 mL IVPB        500 mg 500 mL/hr over 30 Minutes Intravenous  Once 09/18/19 1109 09/18/19 1300   09/18/19 1100  dalbavancin (DALVANCE) 1,000 mg in dextrose 5 % 250 mL IVPB        1,000 mg 500 mL/hr over 30 Minutes Intravenous  Once 09/18/19 1002 09/18/19 1200   09/18/19 0000  cephALEXin (KEFLEX) 500 MG capsule  Status:  Discontinued        500 mg Oral 4 times daily 09/18/19 1007 09/18/19    09/18/19 0000  doxycycline (ADOXA) 100 MG tablet      Discontinue     100 mg Oral 2 times daily 09/18/19 1200 10/26/19 2359   09/18/19 0000  cephALEXin (KEFLEX) 500 MG capsule     Discontinue     500 mg Oral 4 times daily 09/18/19 1204 10/26/19 2359   09/16/19 1600  cefTRIAXone (ROCEPHIN) 2 g in sodium chloride 0.9 % 100 mL IVPB  Status:  Discontinued        2 g 200 mL/hr over 30 Minutes Intravenous Every 24 hours 09/16/19 1535 09/18/19 1420   09/15/19 2300  vancomycin (VANCOREADY) IVPB 1500 mg/300 mL  Status:  Discontinued        1,500  mg 150 mL/hr over 120 Minutes Intravenous Every 24 hours 09/14/19 2332 09/18/19 1002   09/15/19 1636  vancomycin (VANCOCIN) powder  Status:  Discontinued          As needed 09/15/19 1636 09/15/19 1656   09/14/19 2345  vancomycin (VANCOREADY) IVPB 2000 mg/400 mL        2,000 mg 200 mL/hr over 120 Minutes Intravenous  Once 09/14/19 2326 09/15/19 0217       Objective   Vitals:   09/18/19 0740 09/18/19 1914 09/19/19 0410 09/19/19 0716  BP: (!) 167/89 (!) 161/74 (!) 152/70 (!) 155/73  Pulse: 77 89 78 87  Resp: 17 16 16 17   Temp: 98.9 F (37.2 C) 98.4 F (36.9 C) 97.6 F (36.4 C) 99.7 F (37.6 C)  TempSrc: Oral Oral Oral Oral  SpO2: 97% 93% 95% 94%  Weight:      Height:        Intake/Output Summary (Last 24 hours) at 09/19/2019 1200 Last data filed at 09/19/2019 0300 Gross per 24 hour  Intake --  Output 2050 ml  Net -2050 ml    06/09 1901 - 06/11 0700 In: -  Out: 2850 [Urine:2850]  Filed Weights   09/14/19 2004 09/15/19 0316  Weight: 97.1 kg 92.5 kg    Physical Examination:    General-appears in no acute distress  Heart-S1-S2, regular, no murmur auscultated  Lungs-clear to auscultation bilaterally, no wheezing or crackles auscultated  Abdomen-soft, nontender, no organomegaly  Extremities-no edema in the lower extremities  Neuro-alert, oriented x3, no focal deficit noted    Data Reviewed:   Recent Results (from the past 240 hour(s))  Culture, blood (Routine x 2)      Status: None   Collection Time: 09/14/19 10:22 PM   Specimen: BLOOD  Result Value Ref Range Status   Specimen Description BLOOD LEFT ANTECUBITAL  Final   Special Requests   Final    BOTTLES DRAWN AEROBIC AND ANAEROBIC Blood Culture adequate volume   Culture   Final    NO GROWTH 5 DAYS Performed at [facility], [address], [place], [place] [address]    Report Status 09/19/2019 FINAL  Final  SARS Coronavirus 2 by RT PCR (hospital order, performed in [facility] hospital lab) Nasopharyngeal Nasopharyngeal Swab     Status: None   Collection Time: 09/14/19 10:40 PM   Specimen: Nasopharyngeal Swab  Result Value Ref Range Status   SARS Coronavirus 2 NEGATIVE NEGATIVE Final    Comment: (NOTE) SARS-CoV-2 target nucleic acids are NOT DETECTED. The SARS-CoV-2 RNA is generally detectable in upper and lower respiratory specimens during the acute phase of infection. The lowest concentration of SARS-CoV-2 viral copies this assay can detect is 250 copies / mL. A negative result does not preclude SARS-CoV-2 infection and should not be used as the sole basis for treatment or other patient management decisions.  A negative result may occur with improper specimen collection / handling, submission of specimen other than nasopharyngeal swab, presence of viral mutation(s) within the areas targeted by this assay, and inadequate number of viral copies (<250 copies / mL). A negative result must be combined with clinical observations, patient history, and epidemiological information. Fact Sheet for Patients:   StrictlyIdeas.no Fact Sheet for Healthcare Providers: BankingDealers.co.za This test is not yet approved or cleared  by the [place] FDA and has been authorized for detection and/or diagnosis of SARS-CoV-2 by FDA under an Emergency Use Authorization (EUA).  This EUA will remain in effect (  meaning this test can be used) for the duration of  the COVID-19 declaration under Section 564(b)(1) of the Act, 21 U.S.C. section 360bbb-3(b)(1), unless the authorization is terminated or revoked sooner. Performed at [facility], [place] [address] [place], [place] [address]   Culture, blood (Routine x 2)     Status: None   Collection Time: 09/14/19 11:58 PM   Specimen: BLOOD LEFT HAND  Result Value Ref Range Status   Specimen Description BLOOD LEFT HAND  Final   Special Requests   Final    BOTTLES DRAWN AEROBIC ONLY Blood Culture adequate volume   Culture   Final    NO GROWTH 5 DAYS Performed at [facility], [place] [address], [place], [place] [address]    Report Status 09/19/2019 FINAL  Final  Aerobic/Anaerobic Culture (surgical/deep wound)     Status: None (Preliminary result)   Collection Time: 09/15/19  4:27 PM   Specimen: Other Source; Tissue  Result Value Ref Range Status   Specimen Description WOUND RIGHT ELBOW  Final   Special Requests NONE  Final   Gram Stain   Final    ABUNDANT WBC PRESENT, PREDOMINANTLY PMN NO ORGANISMS SEEN    Culture   Final    NO GROWTH 4 DAYS NO ANAEROBES ISOLATED; CULTURE IN PROGRESS FOR 5 DAYS Performed at [facility], [address], [place], [place] [address]    Report Status PENDING  Incomplete      Oswald Hillock   [facility] If 7PM-7AM, please contact night-coverage at [email or web address], Office  [phone]   09/19/2019, 12:00 PM  LOS: 5 days

## 2019-09-19 NOTE — Plan of Care (Signed)
  Problem: Education: Goal: Knowledge of General Education information will improve Description: Including pain rating scale, medication(s)/side effects and non-pharmacologic comfort measures 09/19/2019 0806 by Baldomero Lamy, RN Outcome: Progressing 09/19/2019 0805 by Baldomero Lamy, RN Outcome: Progressing   Problem: Health Behavior/Discharge Planning: Goal: Ability to manage health-related needs will improve 09/19/2019 0806 by Baldomero Lamy, RN Outcome: Progressing 09/19/2019 0805 by Baldomero Lamy, RN Outcome: Progressing   Problem: Clinical Measurements: Goal: Ability to maintain clinical measurements within normal limits will improve 09/19/2019 0806 by Baldomero Lamy, RN Outcome: Progressing 09/19/2019 0805 by Baldomero Lamy, RN Outcome: Progressing Goal: Will remain free from infection 09/19/2019 0806 by Baldomero Lamy, RN Outcome: Progressing 09/19/2019 0805 by Baldomero Lamy, RN Outcome: Progressing Goal: Diagnostic test results will improve 09/19/2019 0806 by Baldomero Lamy, RN Outcome: Progressing 09/19/2019 0805 by Baldomero Lamy, RN Outcome: Progressing Goal: Respiratory complications will improve 09/19/2019 0806 by Baldomero Lamy, RN Outcome: Progressing 09/19/2019 0805 by Baldomero Lamy, RN Outcome: Progressing Goal: Cardiovascular complication will be avoided 09/19/2019 0806 by Baldomero Lamy, RN Outcome: Progressing 09/19/2019 0805 by Baldomero Lamy, RN Outcome: Progressing   Problem: Activity: Goal: Risk for activity intolerance will decrease 09/19/2019 0806 by Baldomero Lamy, RN Outcome: Progressing 09/19/2019 0805 by Baldomero Lamy, RN Outcome: Progressing   Problem: Nutrition: Goal: Adequate nutrition will be maintained 09/19/2019 0806 by Baldomero Lamy, RN Outcome: Progressing 09/19/2019 0805 by Baldomero Lamy, RN Outcome: Progressing   Problem: Coping: Goal: Level of anxiety will decrease 09/19/2019 0806 by Baldomero Lamy, RN Outcome: Progressing 09/19/2019 0805 by Baldomero Lamy, RN Outcome: Progressing   Problem: Elimination: Goal: Will not experience complications related to bowel motility 09/19/2019 0806 by Baldomero Lamy, RN Outcome: Progressing 09/19/2019 0805 by Baldomero Lamy, RN Outcome: Progressing Goal: Will not experience complications related to urinary retention 09/19/2019 0806 by Baldomero Lamy, RN Outcome: Progressing 09/19/2019 0805 by Baldomero Lamy, RN Outcome: Progressing   Problem: Pain Managment: Goal: General experience of comfort will improve 09/19/2019 0806 by Baldomero Lamy, RN Outcome: Progressing 09/19/2019 0805 by Baldomero Lamy, RN Outcome: Progressing   Problem: Safety: Goal: Ability to remain free from injury will improve 09/19/2019 0806 by Baldomero Lamy, RN Outcome: Progressing 09/19/2019 0805 by Baldomero Lamy, RN Outcome: Progressing   Problem: Skin Integrity: Goal: Risk for impaired skin integrity will decrease 09/19/2019 0806 by Baldomero Lamy, RN Outcome: Progressing 09/19/2019 0805 by Baldomero Lamy, RN Outcome: Progressing

## 2019-09-19 NOTE — Plan of Care (Signed)

## 2019-09-19 NOTE — NC FL2 (Signed)
[place] LEVEL OF CARE SCREENING TOOL     IDENTIFICATION  Patient Name: Kayla Griffin Birthdate: 10-27-1940 Sex: female Admission Date (Current Location): 09/14/2019  [facility] [place] Number:  Herbalist and Address:  [facility], [place] [address] [place], [place] [address]      Provider Number: [id number]  Attending Physician Name and Address:  Oswald Hillock, MD  Relative Name and Phone Number:  Gwynne Edinger [phone]    Current Level of Care: SNF Recommended Level of Care: [place] Prior Approval Number:    Date Approved/Denied:   PASRR Number: [id number]  Discharge Plan: SNF    Current Diagnoses: Patient Active Problem List   Diagnosis Date Noted  . Postoperative infection   . Abscess of right upper arm and forearm 09/15/2019  . Cellulitis of right upper extremity 09/14/2019  . Insulin-requiring or dependent type II diabetes mellitus ([place]) 09/14/2019  . Hyponatremia   . Right supracondylar humerus fracture, closed, initial encounter 08/20/2019  . Abnormal EKG 08/10/2016  . Hypertension 08/10/2016    Orientation RESPIRATION BLADDER Height & Weight     Self, Time, Situation, Place  Normal Continent Weight: 92.5 kg Height:  5\' 4"  (162.6 cm)  BEHAVIORAL SYMPTOMS/MOOD NEUROLOGICAL BOWEL NUTRITION STATUS      Continent Diet (See discharge summary)  AMBULATORY STATUS COMMUNICATION OF NEEDS Skin   Supervision   Surgical wounds                       Personal Care Assistance Level of Assistance  Bathing, Feeding, Dressing Bathing Assistance: Limited assistance Feeding assistance: Independent Dressing Assistance: Limited assistance     Functional Limitations Info  Sight, Hearing, Speech Sight Info: Adequate Hearing Info: Adequate Speech Info: Adequate    SPECIAL CARE FACTORS FREQUENCY  PT (By licensed PT), OT (By licensed OT)     PT Frequency: 5 times per week OT Frequency: 5  times per week            Contractures Contractures Info: Not present    Additional Factors Info  Code Status, Allergies Code Status Info: Full code Allergies Info: Actos           Current Medications (09/19/2019):  This is the current hospital active medication list Current Facility-Administered Medications  Medication Dose Route Frequency Provider Last Rate Last Admin  . acetaminophen (TYLENOL) tablet 650 mg  650 mg Oral Q6H PRN Leandrew Koyanagi, MD   650 mg at 09/16/19 1250   Or  . acetaminophen (TYLENOL) suppository 650 mg  650 mg Rectal Q6H PRN Leandrew Koyanagi, MD      . HYDROcodone-acetaminophen (NORCO) 7.5-325 MG per tablet 1-2 tablet  1-2 tablet Oral Q6H PRN Leandrew Koyanagi, MD      . insulin aspart (novoLOG) injection 0-9 Units  0-9 Units Subcutaneous Q4H Leandrew Koyanagi, MD   3 Units at 09/19/19 0929  . insulin glargine (LANTUS) injection 9 Units  9 Units Subcutaneous QHS Oswald Hillock, MD   9 Units at 09/18/19 2200  . lactated ringers infusion   Intravenous Continuous Leandrew Koyanagi, MD 10 mL/hr at 09/15/19 1440 Restarted at 09/15/19 1558  . metoprolol tartrate (LOPRESSOR) tablet 100 mg  100 mg Oral BID Leandrew Koyanagi, MD   100 mg at 09/19/19 0930  . morphine 2 MG/ML injection 1 mg  1 mg Intravenous Q2H PRN Leandrew Koyanagi, MD      .  mupirocin ointment (BACTROBAN) 2 % 1 application  1 application Nasal BID Leandrew Koyanagi, MD   1 application at 11/09/21 0931  . ondansetron (ZOFRAN) tablet 4 mg  4 mg Oral Q6H PRN Leandrew Koyanagi, MD       Or  . ondansetron [facility]) injection 4 mg  4 mg Intravenous Q6H PRN Leandrew Koyanagi, MD      . oxyCODONE-acetaminophen (PERCOCET/ROXICET) 5-325 MG per tablet 1-2 tablet  1-2 tablet Oral Q6H PRN Leandrew Koyanagi, MD   1 tablet at 09/16/19 1707  . ramipril (ALTACE) capsule 20 mg  20 mg Oral Daily Leandrew Koyanagi, MD   20 mg at 09/19/19 0931  . senna-docusate (Senokot-S) tablet 1 tablet  1 tablet Oral QHS PRN Leandrew Koyanagi, MD      . sodium chloride flush (NS)  0.9 % injection 3 mL  3 mL Intravenous Once Leandrew Koyanagi, MD         Discharge Medications: Please see discharge summary for a list of discharge medications.  Relevant Imaging Results:  Relevant Lab Results:   Additional Information    Curlene Labrum, RN

## 2019-09-20 LAB — GLUCOSE, CAPILLARY
Glucose-Capillary: 160 mg/dL — ABNORMAL HIGH (ref 70–99)
Glucose-Capillary: 180 mg/dL — ABNORMAL HIGH (ref 70–99)
Glucose-Capillary: 306 mg/dL — ABNORMAL HIGH (ref 70–99)
Glucose-Capillary: 259 mg/dL — ABNORMAL HIGH (ref 70–99)
Glucose-Capillary: 204 mg/dL — ABNORMAL HIGH (ref 70–99)

## 2019-09-20 LAB — AEROBIC/ANAEROBIC CULTURE W GRAM STAIN (SURGICAL/DEEP WOUND)

## 2019-09-20 MED ORDER — DOXYCYCLINE HYCLATE 100 MG PO TABS
100.0000 mg | ORAL_TABLET | Freq: Two times a day (BID) | ORAL | Status: DC
  Administered 2019-09-20 – 2019-09-25 (×11): 100 mg via ORAL
  Filled 2019-09-20 (×11): qty 1

## 2019-09-20 MED ORDER — CEPHALEXIN 500 MG PO CAPS
500.0000 mg | ORAL_CAPSULE | Freq: Four times a day (QID) | ORAL | Status: DC
  Administered 2019-09-20 – 2019-09-25 (×21): 500 mg via ORAL
  Filled 2019-09-20 (×21): qty 1

## 2019-09-20 NOTE — Plan of Care (Signed)

## 2019-09-20 NOTE — Progress Notes (Signed)
[facility]  PROGRESS NOTE  Kayla Griffin JIR:[id number] DOB: 1941/03/02 DOA: 09/14/2019 PCP: Aretta Nip, MD   Brief HPI:   79 year old female with history of diabetes mellitus type 2, hypertension, hyperlipidemia, endometrial carcinoma status post hysterectomy, supracondylar right humerus fracture s/p ORIF on 08/25/2019 came with increasing redness around surgical site, no drainage despite starting Augmentin a few days ago.  She underwent ORIF on right humerus on 08/25/2019, developed erythema at the site increasing pain and was seen by orthopedic surgeon on 6 3.  She was started on Augmentin for concern for cellulitis.  Unfortunately condition worsen and she developed drainage from the site.  She required excision debridement including skin subcutaneous tissue muscle and bone with pus.  Culture showed no growth.  She was started on IV vancomycin.  ID was consulted for duration of treatment.  she was discharged home yesterday however she did not go because she felt very weak.  Earlier she had refused to go to skilled nursing facility, now agreeable to go to skilled nursing facility for rehab.  Discharge was canceled.   Subjective   Patient seen and examined, no new complaints.  Awaiting bed at nursing facility.   Assessment/Plan:    1. Cellulitis/abscess of right upper extremity-she presented with progressive erythema involving surgical site, draining in setting of ORIF performed on 08/25/2019.  Blood cultures were negative.  Orthopedics was consulted, she underwent excisional debridement of right upper arm and elbow on 09/15/2019.  She was started on vancomycin and plan was to send her home on 6 weeks of IV antibiotics however patient did not want IV therapy at home and also could not afford IV dalbavancin once a week.  So antibiotics were changed to Keflex 500 mg 4 times a day and doxycycline 100 g p.o. twice daily for 6 weeks.  Stop date 10/26/2019.  2. Hypertension-blood pressure is  stable, continue ramipril, metoprolol. 3. Lactic acidosis/hyponatremia-resolved, patient was placed on IV fluid hydration.  Sodium was 127 at the time of admission, improved to 134 as of 09/16/2019 4. Diabetes mellitus type 2-continue sliding scale insulin with NovoLog.  Patient is on NovoLog 70/30 25 units subcu twice daily. 5. Obesity-BMI 35 kg/m  Pressure Injury 09/15/19 Buttocks Left Stage 2 -  Partial thickness loss of dermis presenting as a shallow open injury with a red, pink wound bed without slough. (Active)  09/15/19 0100  Location: Buttocks  Location Orientation: Left  Staging: Stage 2 -  Partial thickness loss of dermis presenting as a shallow open injury with a red, pink wound bed without slough.  Wound Description (Comments):   Present on Admission: Yes        SpO2: 94 % O2 Flow Rate (L/min): 6 L/min   COVID-19 Labs  No results for input(s): DDIMER, FERRITIN, LDH, CRP in the last 72 hours.  Lab Results  Component Value Date   SARSCOV2NAA NEGATIVE 09/14/2019   [place] NEGATIVE 08/22/2019     CBG: Recent Labs  Lab 09/19/19 1946 09/19/19 2330 09/20/19 0326 09/20/19 0804 09/20/19 1147  GLUCAP 219* 152* 160* 180* 306*    CBC: Recent Labs  Lab 09/14/19 2008 09/14/19 2219 09/15/19 0130 09/16/19 0337  WBC 13.3* 13.2* 13.4* 8.7  NEUTROABS 9.5* 10.1* 10.2* 6.8  HGB 11.4* 11.4* 10.4* 10.2*  HCT 35.0* 34.8* 31.7* 31.3*  MCV 89.7 89.7 88.5 87.9  PLT 378 408* 377 751    Basic Metabolic Panel: Recent Labs  Lab 09/14/19 2008 09/15/19 0130 09/16/19 0337  NA 127*  130* 134*  K 4.4 3.6 4.9  CL 89* 95* 101  CO2 21* 21* 20*  GLUCOSE 362* 144* 178*  BUN 18 14 13   CREATININE 0.88 0.69 0.60  CALCIUM 9.2 8.7* 8.2*     Liver Function Tests: Recent Labs  Lab 09/14/19 2008 09/15/19 0130 09/16/19 0337  AST 81* 79* QUANTITY NOT SUFFICIENT, UNABLE TO PERFORM TEST  ALT 86* 83* QUANTITY NOT SUFFICIENT, UNABLE TO PERFORM TEST  ALKPHOS 92 82 73  BILITOT  0.6 0.7 QUANTITY NOT SUFFICIENT, UNABLE TO PERFORM TEST  PROT 7.0 6.4* 5.7*  ALBUMIN 2.2* 2.0* 1.8*        DVT prophylaxis: SCDs  Code Status: Full code  Family Communication: No family at bedside    Status is: Inpatient  Dispo: The patient is from: Home              Anticipated d/c is to: Home              Anticipated d/c date is: 09/22/2019             Patient is medically stable for discharge  Barrier to discharge-awaiting bed at skilled nursing facility  Pressure Injury 09/15/19 Buttocks Left Stage 2 -  Partial thickness loss of dermis presenting as a shallow open injury with a red, pink wound bed without slough. (Active)  09/15/19 0100  Location: Buttocks  Location Orientation: Left  Staging: Stage 2 -  Partial thickness loss of dermis presenting as a shallow open injury with a red, pink wound bed without slough.  Wound Description (Comments):   Present on Admission: Yes       Scheduled medications:  . cephALEXin  500 mg Oral Q6H  . doxycycline  100 mg Oral Q12H  . insulin aspart  0-9 Units Subcutaneous Q4H  . insulin glargine  9 Units Subcutaneous QHS  . metoprolol tartrate  100 mg Oral BID  . ramipril  20 mg Oral Daily  . sodium chloride flush  3 mL Intravenous Once    Consultants:  Orthopedics  Infectious disease  Procedures:  Excisional debridement of skin for right upper extremity abscess  Antibiotics:   Anti-infectives (From admission, onward)   Start     Dose/Rate Route Frequency Ordered Stop   09/20/19 1200  cephALEXin (KEFLEX) capsule 500 mg     Discontinue     500 mg Oral Every 6 hours 09/20/19 0845     09/20/19 1000  doxycycline (VIBRA-TABS) tablet 100 mg     Discontinue     100 mg Oral Every 12 hours 09/20/19 0845     09/18/19 1130  dalbavancin (DALVANCE) 500 mg in dextrose 5 % 250 mL IVPB        500 mg 500 mL/hr over 30 Minutes Intravenous  Once 09/18/19 1109 09/18/19 1300   09/18/19 1100  dalbavancin (DALVANCE) 1,000 mg in  dextrose 5 % 250 mL IVPB        1,000 mg 500 mL/hr over 30 Minutes Intravenous  Once 09/18/19 1002 09/18/19 1200   09/18/19 0000  cephALEXin (KEFLEX) 500 MG capsule  Status:  Discontinued        500 mg Oral 4 times daily 09/18/19 1007 09/18/19    09/18/19 0000  doxycycline (ADOXA) 100 MG tablet     Discontinue     100 mg Oral 2 times daily 09/18/19 1200 10/26/19 2359   09/18/19 0000  cephALEXin (KEFLEX) 500 MG capsule     Discontinue     500 mg  Oral 4 times daily 09/18/19 1204 10/26/19 2359   09/16/19 1600  cefTRIAXone (ROCEPHIN) 2 g in sodium chloride 0.9 % 100 mL IVPB  Status:  Discontinued        2 g 200 mL/hr over 30 Minutes Intravenous Every 24 hours 09/16/19 1535 09/18/19 1420   09/15/19 2300  vancomycin (VANCOREADY) IVPB 1500 mg/300 mL  Status:  Discontinued        1,500 mg 150 mL/hr over 120 Minutes Intravenous Every 24 hours 09/14/19 2332 09/18/19 1002   09/15/19 1636  vancomycin (VANCOCIN) powder  Status:  Discontinued          As needed 09/15/19 1636 09/15/19 1656   09/14/19 2345  vancomycin (VANCOREADY) IVPB 2000 mg/400 mL        2,000 mg 200 mL/hr over 120 Minutes Intravenous  Once 09/14/19 2326 09/15/19 0217       Objective   Vitals:   09/19/19 1403 09/19/19 1947 09/20/19 0329 09/20/19 0828  BP: (!) 146/72 (!) 148/75 (!) 141/71 (!) 168/90  Pulse: 77 81 74 93  Resp: 17 16 16 18   Temp: 99.3 F (37.4 C) 98.7 F (37.1 C) 98.4 F (36.9 C) 98.6 F (37 C)  TempSrc: Oral Oral Oral Oral  SpO2: 95% 93% 93% 94%  Weight:      Height:        Intake/Output Summary (Last 24 hours) at 09/20/2019 1210 Last data filed at 09/20/2019 7564 Gross per 24 hour  Intake --  Output 1900 ml  Net -1900 ml    06/10 1901 - 06/12 0700 In: -  Out: 2250 [Urine:2250]  Filed Weights   09/14/19 2004 09/15/19 0316  Weight: 97.1 kg 92.5 kg    Physical Examination:    General-appears in no acute distress  Heart-S1-S2, regular, no murmur auscultated  Lungs-clear to auscultation  bilaterally, no wheezing or crackles auscultated  Abdomen-soft, nontender, no organomegaly  Extremities-no edema in the lower extremities  Neuro-alert, oriented x3, no focal deficit noted    Data Reviewed:   Recent Results (from the past 240 hour(s))  Culture, blood (Routine x 2)     Status: Kayla Griffin   Collection Time: 09/14/19 10:22 PM   Specimen: BLOOD  Result Value Ref Range Status   Specimen Description BLOOD LEFT ANTECUBITAL  Final   Special Requests   Final    BOTTLES DRAWN AEROBIC AND ANAEROBIC Blood Culture adequate volume   Culture   Final    NO GROWTH 5 DAYS Performed at [facility], [address], [place], [place] [address]    Report Status 09/19/2019 FINAL  Final  SARS Coronavirus 2 by RT PCR (hospital order, performed in [facility] hospital lab) Nasopharyngeal Nasopharyngeal Swab     Status: Kayla Griffin   Collection Time: 09/14/19 10:40 PM   Specimen: Nasopharyngeal Swab  Result Value Ref Range Status   SARS Coronavirus 2 NEGATIVE NEGATIVE Final    Comment: (NOTE) SARS-CoV-2 target nucleic acids are NOT DETECTED. The SARS-CoV-2 RNA is generally detectable in upper and lower respiratory specimens during the acute phase of infection. The lowest concentration of SARS-CoV-2 viral copies this assay can detect is 250 copies / mL. A negative result does not preclude SARS-CoV-2 infection and should not be used as the sole basis for treatment or other patient management decisions.  A negative result may occur with improper specimen collection / handling, submission of specimen other than nasopharyngeal swab, presence of viral mutation(s) within the areas targeted by this assay, and inadequate  number of viral copies (<250 copies / mL). A negative result must be combined with clinical observations, patient history, and epidemiological information. Fact Sheet for Patients:   StrictlyIdeas.no Fact Sheet for Healthcare  Providers: BankingDealers.co.za This test is not yet approved or cleared  by the [place] FDA and has been authorized for detection and/or diagnosis of SARS-CoV-2 by FDA under an Emergency Use Authorization (EUA).  This EUA will remain in effect (meaning this test can be used) for the duration of the COVID-19 declaration under Section 564(b)(1) of the Act, 21 U.S.C. section 360bbb-3(b)(1), unless the authorization is terminated or revoked sooner. Performed at [facility], [place] [address], [place], [place] [address]   Culture, blood (Routine x 2)     Status: Kayla Griffin   Collection Time: 09/14/19 11:58 PM   Specimen: BLOOD LEFT HAND  Result Value Ref Range Status   Specimen Description BLOOD LEFT HAND  Final   Special Requests   Final    BOTTLES DRAWN AEROBIC ONLY Blood Culture adequate volume   Culture   Final    NO GROWTH 5 DAYS Performed at [facility], [place] [address], [place], [place] [address]    Report Status 09/19/2019 FINAL  Final  Aerobic/Anaerobic Culture (surgical/deep wound)     Status: Kayla Griffin (Preliminary result)   Collection Time: 09/15/19  4:27 PM   Specimen: Other Source; Tissue  Result Value Ref Range Status   Specimen Description WOUND RIGHT ELBOW  Final   Special Requests Kayla Griffin  Final   Gram Stain   Final    ABUNDANT WBC PRESENT, PREDOMINANTLY PMN NO ORGANISMS SEEN    Culture   Final    NO GROWTH 4 DAYS NO ANAEROBES ISOLATED; CULTURE IN PROGRESS FOR 5 DAYS Performed at [facility], [address] [place], [place] [address]    Report Status PENDING  Incomplete      Oswald Hillock   [facility] If 7PM-7AM, please contact night-coverage at [email or web address], Office  [phone]   09/20/2019, 12:10 PM  LOS: 6 days

## 2019-09-20 NOTE — TOC Progression Note (Signed)
Transition of Care [facility]) - Progression Note    Patient Details  Name: EKNOOR NOVACK MRN: [id number] Date of Birth: 12/24/1940  Transition of Care [facility]) CM/SW [place], [place] Phone Number: 09/20/2019, 4:11 PM  Clinical Narrative:     CSW spoke to pt bedside. CSW gave pt bed offers. Pt chose to go to [facility]. Pt asked when she would be ready t go . CSW informed pt that the next step are to get insurance authorization and that could take a few days. Pt stated she was ready to go so she can get PT and be able to go back to her home. Pt stated that the everyone is working together to make sure pt has an easy transition to SNF.   CSW will continue to follow.   Expected Discharge Plan: [place] Barriers to Discharge: No Barriers Identified  Expected Discharge Plan and Services Expected Discharge Plan: [place]   Discharge Planning Services: CM Consult Post Acute Care Choice: [place] arrangements for the past 2 months: Apartment Expected Discharge Date: 09/18/19                         HH Arranged: PT, OT HH Agency: [facility]         Social Determinants of Health (SDOH) Interventions    Readmission Risk Interventions Readmission Risk Prevention Plan 09/18/2019  Transportation Screening Complete  PCP or Specialist Appt within 5-7 Days Complete  Home Care Screening Complete  Medication Review (RN CM) Complete  Some recent data might be hidden   Emeterio Reeve, Latanya Presser, [place] Social Worker [phone]

## 2019-09-21 LAB — GLUCOSE, CAPILLARY
Glucose-Capillary: 126 mg/dL — ABNORMAL HIGH (ref 70–99)
Glucose-Capillary: 158 mg/dL — ABNORMAL HIGH (ref 70–99)
Glucose-Capillary: 162 mg/dL — ABNORMAL HIGH (ref 70–99)
Glucose-Capillary: 181 mg/dL — ABNORMAL HIGH (ref 70–99)
Glucose-Capillary: 225 mg/dL — ABNORMAL HIGH (ref 70–99)
Glucose-Capillary: 225 mg/dL — ABNORMAL HIGH (ref 70–99)

## 2019-09-21 LAB — CBC
HCT: 30.9 % — ABNORMAL LOW (ref 36.0–46.0)
Hemoglobin: 9.9 g/dL — ABNORMAL LOW (ref 12.0–15.0)
MCH: 28.5 pg (ref 26.0–34.0)
MCHC: 32 g/dL (ref 30.0–36.0)
MCV: 89 fL (ref 80.0–100.0)
Platelets: 271 10*3/uL (ref 150–400)
RBC: 3.47 MIL/uL — ABNORMAL LOW (ref 3.87–5.11)
RDW: 13.2 % (ref 11.5–15.5)
WBC: 10.2 10*3/uL (ref 4.0–10.5)
nRBC: 0 % (ref 0.0–0.2)

## 2019-09-21 MED ORDER — IBUPROFEN 200 MG PO TABS
600.0000 mg | ORAL_TABLET | Freq: Three times a day (TID) | ORAL | Status: AC
  Administered 2019-09-21 (×3): 600 mg via ORAL
  Filled 2019-09-21 (×3): qty 3

## 2019-09-21 NOTE — Progress Notes (Signed)
[facility]  PROGRESS NOTE  Kayla Griffin AST:[id number] DOB: 04-28-1940 DOA: 09/14/2019 PCP: Aretta Nip, MD   Brief HPI:   79 year old female with history of diabetes mellitus type 2, hypertension, hyperlipidemia, endometrial carcinoma status post hysterectomy, supracondylar right humerus fracture s/p ORIF on 08/25/2019 came with increasing redness around surgical site, no drainage despite starting Augmentin a few days ago.  She underwent ORIF on right humerus on 08/25/2019, developed erythema at the site increasing pain and was seen by orthopedic surgeon on 6 3.  She was started on Augmentin for concern for cellulitis.  Unfortunately condition worsen and she developed drainage from the site.  She required excision debridement including skin subcutaneous tissue muscle and bone with pus.  Culture showed no growth.  She was started on IV vancomycin.  ID was consulted for duration of treatment.  she was discharged home yesterday however she did not go because she felt very weak.  Earlier she had refused to go to skilled nursing facility, now agreeable to go to skilled nursing facility for rehab.  Discharge was canceled.   Subjective   Patient seen and examined, complains of pain in the right upper extremity.  She is concerned that she may have infection again.  She was started on Keflex and doxycycline.  However she has no fever.   Assessment/Plan:    1. Cellulitis/abscess of right upper extremity-she presented with progressive erythema involving surgical site, draining in setting of ORIF performed on 08/25/2019.  Blood cultures were negative.  Orthopedics was consulted, she underwent excisional debridement of right upper arm and elbow on 09/15/2019.  She was started on vancomycin and plan was to send her home on 6 weeks of IV antibiotics however patient did not want IV therapy at home and also could not afford IV dalbavancin once a week.  So antibiotics were changed to Keflex 500 mg 4  times a day and doxycycline 100 g p.o. twice daily for 6 weeks.  Stop date 10/26/2019.  2. Right upper extremity pain-right upper extremity in dressing, she is afebrile.  Will check CBC today.  Also start Motrin 6 mg 3 times daily x3 doses for pain.  Follow CBC.  Patient just had surgery and is likely getting pain from surgery.  Continue pain management.  We will keep a close watch for recurrence of infection.  She is currently on antibiotics. 3. Hypertension-blood pressure is stable, continue ramipril, metoprolol. 4. Lactic acidosis/hyponatremia-resolved, patient was placed on IV fluid hydration.  Sodium was 127 at the time of admission, improved to 134 as of 09/16/2019 5. Diabetes mellitus type 2-continue sliding scale insulin with NovoLog.  Patient is on NovoLog 70/30 25 units subcu twice daily. 6. Obesity-BMI 35 kg/m  Pressure Injury 09/15/19 Buttocks Left Stage 2 -  Partial thickness loss of dermis presenting as a shallow open injury with a red, pink wound bed without slough. (Active)  09/15/19 0100  Location: Buttocks  Location Orientation: Left  Staging: Stage 2 -  Partial thickness loss of dermis presenting as a shallow open injury with a red, pink wound bed without slough.  Wound Description (Comments):   Present on Admission: Yes        SpO2: 96 % O2 Flow Rate (L/min): 6 L/min   COVID-19 Labs  No results for input(s): DDIMER, FERRITIN, LDH, CRP in the last 72 hours.  Lab Results  Component Value Date   [place] NEGATIVE 09/14/2019   [place] NEGATIVE 08/22/2019     CBG: Recent Labs  Lab 09/20/19 1840 09/20/19 2006 09/21/19 0000 09/21/19 0316 09/21/19 0807  GLUCAP 259* 204* 126* 162* 158*    CBC: Recent Labs  Lab 09/14/19 2008 09/14/19 2219 09/15/19 0130 09/16/19 0337  WBC 13.3* 13.2* 13.4* 8.7  NEUTROABS 9.5* 10.1* 10.2* 6.8  HGB 11.4* 11.4* 10.4* 10.2*  HCT 35.0* 34.8* 31.7* 31.3*  MCV 89.7 89.7 88.5 87.9  PLT 378 408* 377 300    Basic  Metabolic Panel: Recent Labs  Lab 09/14/19 2008 09/15/19 0130 09/16/19 0337  NA 127* 130* 134*  K 4.4 3.6 4.9  CL 89* 95* 101  CO2 21* 21* 20*  GLUCOSE 362* 144* 178*  BUN 18 14 13   CREATININE 0.88 0.69 0.60  CALCIUM 9.2 8.7* 8.2*     Liver Function Tests: Recent Labs  Lab 09/14/19 2008 09/15/19 0130 09/16/19 0337  AST 81* 79* QUANTITY NOT SUFFICIENT, UNABLE TO PERFORM TEST  ALT 86* 83* QUANTITY NOT SUFFICIENT, UNABLE TO PERFORM TEST  ALKPHOS 92 82 73  BILITOT 0.6 0.7 QUANTITY NOT SUFFICIENT, UNABLE TO PERFORM TEST  PROT 7.0 6.4* 5.7*  ALBUMIN 2.2* 2.0* 1.8*        DVT prophylaxis: SCDs  Code Status: Full code  Family Communication: No family at bedside    Status is: Inpatient  Dispo: The patient is from: Home              Anticipated d/c is to: Home              Anticipated d/c date is: 09/22/2019             Patient is medically stable for discharge  Barrier to discharge-awaiting bed at skilled nursing facility  Pressure Injury 09/15/19 Buttocks Left Stage 2 -  Partial thickness loss of dermis presenting as a shallow open injury with a red, pink wound bed without slough. (Active)  09/15/19 0100  Location: Buttocks  Location Orientation: Left  Staging: Stage 2 -  Partial thickness loss of dermis presenting as a shallow open injury with a red, pink wound bed without slough.  Wound Description (Comments):   Present on Admission: Yes       Scheduled medications:  . cephALEXin  500 mg Oral Q6H  . doxycycline  100 mg Oral Q12H  . ibuprofen  600 mg Oral TID  . insulin aspart  0-9 Units Subcutaneous Q4H  . insulin glargine  9 Units Subcutaneous QHS  . metoprolol tartrate  100 mg Oral BID  . ramipril  20 mg Oral Daily  . sodium chloride flush  3 mL Intravenous Once    Consultants:  Orthopedics  Infectious disease  Procedures:  Excisional debridement of skin for right upper extremity abscess  Antibiotics:   Anti-infectives (From  admission, onward)   Start     Dose/Rate Route Frequency Ordered Stop   09/20/19 1200  cephALEXin (KEFLEX) capsule 500 mg     Discontinue     500 mg Oral Every 6 hours 09/20/19 0845     09/20/19 1000  doxycycline (VIBRA-TABS) tablet 100 mg     Discontinue     100 mg Oral Every 12 hours 09/20/19 0845     09/18/19 1130  dalbavancin (DALVANCE) 500 mg in dextrose 5 % 250 mL IVPB        500 mg 500 mL/hr over 30 Minutes Intravenous  Once 09/18/19 1109 09/18/19 1300   09/18/19 1100  dalbavancin (DALVANCE) 1,000 mg in dextrose 5 % 250 mL IVPB  1,000 mg 500 mL/hr over 30 Minutes Intravenous  Once 09/18/19 1002 09/18/19 1200   09/18/19 0000  cephALEXin (KEFLEX) 500 MG capsule  Status:  Discontinued        500 mg Oral 4 times daily 09/18/19 1007 09/18/19    09/18/19 0000  doxycycline (ADOXA) 100 MG tablet     Discontinue     100 mg Oral 2 times daily 09/18/19 1200 10/26/19 2359   09/18/19 0000  cephALEXin (KEFLEX) 500 MG capsule     Discontinue     500 mg Oral 4 times daily 09/18/19 1204 10/26/19 2359   09/16/19 1600  cefTRIAXone (ROCEPHIN) 2 g in sodium chloride 0.9 % 100 mL IVPB  Status:  Discontinued        2 g 200 mL/hr over 30 Minutes Intravenous Every 24 hours 09/16/19 1535 09/18/19 1420   09/15/19 2300  vancomycin (VANCOREADY) IVPB 1500 mg/300 mL  Status:  Discontinued        1,500 mg 150 mL/hr over 120 Minutes Intravenous Every 24 hours 09/14/19 2332 09/18/19 1002   09/15/19 1636  vancomycin (VANCOCIN) powder  Status:  Discontinued          As needed 09/15/19 1636 09/15/19 1656   09/14/19 2345  vancomycin (VANCOREADY) IVPB 2000 mg/400 mL        2,000 mg 200 mL/hr over 120 Minutes Intravenous  Once 09/14/19 2326 09/15/19 0217       Objective   Vitals:   09/20/19 2006 09/21/19 0315 09/21/19 0500 09/21/19 0807  BP: (!) 150/91 (!) 155/74  (!) 155/79  Pulse: 73 82  79  Resp: 16 16  14   Temp: 98.3 F (36.8 C) 98.1 F (36.7 C)  97.6 F (36.4 C)  TempSrc: Oral Oral  Oral   SpO2: 97% 95%  96%  Weight:   91.6 kg   Height:        Intake/Output Summary (Last 24 hours) at 09/21/2019 1142 Last data filed at 09/21/2019 0850 Gross per 24 hour  Intake 120 ml  Output --  Net 120 ml    06/11 1901 - 06/13 0700 In: -  Out: 1300 [Urine:1300]  Filed Weights   09/14/19 2004 09/15/19 0316 09/21/19 0500  Weight: 97.1 kg 92.5 kg 91.6 kg    Physical Examination:    General-appears in no acute distress  Heart-S1-S2, regular, no murmur auscultated  Lungs-clear to auscultation bilaterally, no wheezing or crackles auscultated  Abdomen-soft, nontender, no organomegaly  Extremities-right upper extremity in dressing, no erythema or swelling noted in the right hand.  Neuro-alert, oriented x3, no focal deficit noted    Data Reviewed:   Recent Results (from the past 240 hour(s))  Culture, blood (Routine x 2)     Status: None   Collection Time: 09/14/19 10:22 PM   Specimen: BLOOD  Result Value Ref Range Status   Specimen Description BLOOD LEFT ANTECUBITAL  Final   Special Requests   Final    BOTTLES DRAWN AEROBIC AND ANAEROBIC Blood Culture adequate volume   Culture   Final    NO GROWTH 5 DAYS Performed at [facility], [address], [place], [place] [address]    Report Status 09/19/2019 FINAL  Final  SARS Coronavirus 2 by RT PCR (hospital order, performed in [facility] hospital lab) Nasopharyngeal Nasopharyngeal Swab     Status: None   Collection Time: 09/14/19 10:40 PM   Specimen: Nasopharyngeal Swab  Result Value Ref Range Status   SARS Coronavirus 2 NEGATIVE  NEGATIVE Final    Comment: (NOTE) SARS-CoV-2 target nucleic acids are NOT DETECTED. The SARS-CoV-2 RNA is generally detectable in upper and lower respiratory specimens during the acute phase of infection. The lowest concentration of SARS-CoV-2 viral copies this assay can detect is 250 copies / mL. A negative result does not preclude SARS-CoV-2 infection and should not be used as the  sole basis for treatment or other patient management decisions.  A negative result may occur with improper specimen collection / handling, submission of specimen other than nasopharyngeal swab, presence of viral mutation(s) within the areas targeted by this assay, and inadequate number of viral copies (<250 copies / mL). A negative result must be combined with clinical observations, patient history, and epidemiological information. Fact Sheet for Patients:   StrictlyIdeas.no Fact Sheet for Healthcare Providers: BankingDealers.co.za This test is not yet approved or cleared  by the [place] FDA and has been authorized for detection and/or diagnosis of SARS-CoV-2 by FDA under an Emergency Use Authorization (EUA).  This EUA will remain in effect (meaning this test can be used) for the duration of the COVID-19 declaration under Section 564(b)(1) of the Act, 21 U.S.C. section 360bbb-3(b)(1), unless the authorization is terminated or revoked sooner. Performed at [facility], [place] [address] [place], [place] [address]   Culture, blood (Routine x 2)     Status: None   Collection Time: 09/14/19 11:58 PM   Specimen: BLOOD LEFT HAND  Result Value Ref Range Status   Specimen Description BLOOD LEFT HAND  Final   Special Requests   Final    BOTTLES DRAWN AEROBIC ONLY Blood Culture adequate volume   Culture   Final    NO GROWTH 5 DAYS Performed at [facility], [place] [address], [place], [place] [address]    Report Status 09/19/2019 FINAL  Final  Aerobic/Anaerobic Culture (surgical/deep wound)     Status: None   Collection Time: 09/15/19  4:27 PM   Specimen: Other Source; Tissue  Result Value Ref Range Status   Specimen Description WOUND RIGHT ELBOW  Final   Special Requests NONE  Final   Gram Stain   Final    ABUNDANT WBC PRESENT, PREDOMINANTLY PMN NO ORGANISMS SEEN    Culture   Final    RARE PROPIONIBACTERIUM  SPECIES Standardized susceptibility testing for this organism is not available. Performed at [facility], [place] [address] [place],  [address]    Report Status 09/20/2019 FINAL  Final      Oswald Hillock   [facility] If 7PM-7AM, please contact night-coverage at [email or web address], Office  [phone]   09/21/2019, 11:42 AM  LOS: 7 days

## 2019-09-21 NOTE — Plan of Care (Signed)

## 2019-09-21 NOTE — Plan of Care (Signed)

## 2019-09-22 DIAGNOSIS — G8918 Other acute postprocedural pain: Secondary | ICD-10-CM

## 2019-09-22 LAB — GLUCOSE, CAPILLARY
Glucose-Capillary: 132 mg/dL — ABNORMAL HIGH (ref 70–99)
Glucose-Capillary: 151 mg/dL — ABNORMAL HIGH (ref 70–99)
Glucose-Capillary: 163 mg/dL — ABNORMAL HIGH (ref 70–99)
Glucose-Capillary: 184 mg/dL — ABNORMAL HIGH (ref 70–99)
Glucose-Capillary: 196 mg/dL — ABNORMAL HIGH (ref 70–99)
Glucose-Capillary: 233 mg/dL — ABNORMAL HIGH (ref 70–99)
Glucose-Capillary: 240 mg/dL — ABNORMAL HIGH (ref 70–99)

## 2019-09-22 NOTE — Progress Notes (Addendum)
[facility]  PROGRESS NOTE  Kayla Griffin HYW:[id number] DOB: 04/28/1940 DOA: 09/14/2019 PCP: Aretta Nip, MD   Brief HPI:   79 year old female with history of diabetes mellitus type 2, hypertension, hyperlipidemia, endometrial carcinoma status post hysterectomy, supracondylar right humerus fracture s/p ORIF on 08/25/2019 came with increasing redness around surgical site, no drainage despite starting Augmentin a few days ago.  She underwent ORIF on right humerus on 08/25/2019, developed erythema at the site increasing pain and was seen by orthopedic surgeon on 6 3.  She was started on Augmentin for concern for cellulitis.  Unfortunately condition worsen and she developed drainage from the site.  She required excision debridement including skin subcutaneous tissue muscle and bone with pus.  Culture showed no growth.  She was started on IV vancomycin.  ID was consulted for duration of treatment.  she was discharged home yesterday however she did not go because she felt very weak.  Earlier she had refused to go to skilled nursing facility, now agreeable to go to skilled nursing facility for rehab.  Discharge was canceled.   Subjective   Patient seen and examined, complains of drainage from right arm this morning.  Patient's arm pain actually improved after drainage started.   Assessment/Plan:    1. Cellulitis/abscess of right upper extremity-she presented with progressive erythema involving surgical site, draining in setting of ORIF performed on 08/25/2019.  Blood cultures were negative.  Orthopedics was consulted, she underwent excisional debridement of right upper arm and elbow on 09/15/2019.  She was started on vancomycin and plan was to send her home on 6 weeks of IV antibiotics however patient did not want IV therapy at home and also could not afford IV dalbavancin once a week.  So antibiotics were changed to Keflex 500 mg 4 times a day and doxycycline 100 g p.o. twice daily for 6  weeks.  Stop date 10/26/2019.  2. Right upper extremity pain-right upper extremity in dressing, she is afebrile.  Drainage noted from the right arm wound, old dressing was wet.  Orthopedics was consulted.  Patient dressing was changed, mild fibrinous exudate was expressed at the bedside by orthopedic surgeon.  Continue dressing changes twice a day.  Patient's pain improved after the procedure.   She is currently on antibiotics. 3. Hypertension-blood pressure is stable, continue ramipril, metoprolol. 4. Lactic acidosis/hyponatremia-resolved, patient was placed on IV fluid hydration.  Sodium was 127 at the time of admission, improved to 134 as of 09/16/2019 5. Diabetes mellitus type 2-continue sliding scale insulin with NovoLog.  Patient is on NovoLog 70/30   25 units subcu twice daily. 6. Obesity-BMI 35 kg/m  Pressure Injury 09/15/19 Buttocks Left Stage 2 -  Partial thickness loss of dermis presenting as a shallow open injury with a red, pink wound bed without slough. (Active)  09/15/19 0100  Location: Buttocks  Location Orientation: Left  Staging: Stage 2 -  Partial thickness loss of dermis presenting as a shallow open injury with a red, pink wound bed without slough.  Wound Description (Comments):   Present on Admission: Yes        SpO2: 94 % O2 Flow Rate (L/min): 6 L/min   COVID-19 Labs  No results for input(s): DDIMER, FERRITIN, LDH, CRP in the last 72 hours.  Lab Results  Component Value Date   [place]ARSCOV2NAA NEGATIVE 09/14/2019   [place] NEGATIVE 08/22/2019     CBG: Recent Labs  Lab 09/22/19 0038 09/22/19 0305 09/22/19 0542 09/22/19 0733 09/22/19 1143  GLUCAP 184*  163* 132* 151* 240*    CBC: Recent Labs  Lab 09/16/19 0337 09/21/19 1306  WBC 8.7 10.2  NEUTROABS 6.8  --   HGB 10.2* 9.9*  HCT 31.3* 30.9*  MCV 87.9 89.0  PLT 300 335    Basic Metabolic Panel: Recent Labs  Lab 09/16/19 0337  NA 134*  K 4.9  CL 101  CO2 20*  GLUCOSE 178*  BUN 13   CREATININE 0.60  CALCIUM 8.2*     Liver Function Tests: Recent Labs  Lab 09/16/19 0337  AST QUANTITY NOT SUFFICIENT, UNABLE TO PERFORM TEST  ALT QUANTITY NOT SUFFICIENT, UNABLE TO PERFORM TEST  ALKPHOS 53  BILITOT QUANTITY NOT SUFFICIENT, UNABLE TO PERFORM TEST  PROT 5.7*  ALBUMIN 1.8*        DVT prophylaxis: SCDs  Code Status: Full code  Family Communication: No family at bedside    Status is: Inpatient  Dispo: The patient is from: Home              Anticipated d/c is to: Home              Anticipated d/c date is: 09/23/2019             Patient is medically stable for discharge  Barrier to discharge-awaiting bed at skilled nursing facility  Pressure Injury 09/15/19 Buttocks Left Stage 2 -  Partial thickness loss of dermis presenting as a shallow open injury with a red, pink wound bed without slough. (Active)  09/15/19 0100  Location: Buttocks  Location Orientation: Left  Staging: Stage 2 -  Partial thickness loss of dermis presenting as a shallow open injury with a red, pink wound bed without slough.  Wound Description (Comments):   Present on Admission: Yes       Scheduled medications:  . cephALEXin  500 mg Oral Q6H  . doxycycline  100 mg Oral Q12H  . insulin aspart  0-9 Units Subcutaneous Q4H  . insulin glargine  9 Units Subcutaneous QHS  . metoprolol tartrate  100 mg Oral BID  . ramipril  20 mg Oral Daily  . sodium chloride flush  3 mL Intravenous Once    Consultants:  Orthopedics  Infectious disease  Procedures:  Excisional debridement of skin for right upper extremity abscess  Antibiotics:   Anti-infectives (From admission, onward)   Start     Dose/Rate Route Frequency Ordered Stop   09/20/19 1200  cephALEXin (KEFLEX) capsule 500 mg     Discontinue     500 mg Oral Every 6 hours 09/20/19 0845     09/20/19 1000  doxycycline (VIBRA-TABS) tablet 100 mg     Discontinue     100 mg Oral Every 12 hours 09/20/19 0845     09/18/19 1130   dalbavancin (DALVANCE) 500 mg in dextrose 5 % 250 mL IVPB        500 mg 500 mL/hr over 30 Minutes Intravenous  Once 09/18/19 1109 09/18/19 1300   09/18/19 1100  dalbavancin (DALVANCE) 1,000 mg in dextrose 5 % 250 mL IVPB        1,000 mg 500 mL/hr over 30 Minutes Intravenous  Once 09/18/19 1002 09/18/19 1200   09/18/19 0000  cephALEXin (KEFLEX) 500 MG capsule  Status:  Discontinued        500 mg Oral 4 times daily 09/18/19 1007 09/18/19    09/18/19 0000  doxycycline (ADOXA) 100 MG tablet     Discontinue     100 mg Oral 2 times daily  09/18/19 1200 10/26/19 2359   09/18/19 0000  cephALEXin (KEFLEX) 500 MG capsule     Discontinue     500 mg Oral 4 times daily 09/18/19 1204 10/26/19 2359   09/16/19 1600  cefTRIAXone (ROCEPHIN) 2 g in sodium chloride 0.9 % 100 mL IVPB  Status:  Discontinued        2 g 200 mL/hr over 30 Minutes Intravenous Every 24 hours 09/16/19 1535 09/18/19 1420   09/15/19 2300  vancomycin (VANCOREADY) IVPB 1500 mg/300 mL  Status:  Discontinued        1,500 mg 150 mL/hr over 120 Minutes Intravenous Every 24 hours 09/14/19 2332 09/18/19 1002   09/15/19 1636  vancomycin (VANCOCIN) powder  Status:  Discontinued          As needed 09/15/19 1636 09/15/19 1656   09/14/19 2345  vancomycin (VANCOREADY) IVPB 2000 mg/400 mL        2,000 mg 200 mL/hr over 120 Minutes Intravenous  Once 09/14/19 2326 09/15/19 0217       Objective   Vitals:   09/21/19 1948 09/22/19 0306 09/22/19 0736 09/22/19 1424  BP: (!) 147/73 (!) 142/82 (!) 141/79 (!) 146/68  Pulse: 77 64 77 61  Resp: 17 17 18 17   Temp: 98.1 F (36.7 C) 97.8 F (36.6 C) 98 F (36.7 C) 98.1 F (36.7 C)  TempSrc: Oral Oral Oral Oral  SpO2: 95% 95% 96% 94%  Weight:      Height:        Intake/Output Summary (Last 24 hours) at 09/22/2019 1603 Last data filed at 09/22/2019 0732 Gross per 24 hour  Intake --  Output 2600 ml  Net -2600 ml    06/12 1901 - 06/14 0700 In: 360 [P.O.:360] Out: 3000 [Urine:3000]  Filed  Weights   09/14/19 2004 09/15/19 0316 09/21/19 0500  Weight: 97.1 kg 92.5 kg 91.6 kg    Physical Examination:    General-appears in no acute distress  Heart-S1-S2, regular, no murmur auscultated  Lungs-clear to auscultation bilaterally, no wheezing or crackles auscultated  Abdomen-soft, nontender, no organomegaly  Extremities-right upper extremity dressing is wet with discharge  Neuro-alert, oriented x3, no focal deficit noted    Data Reviewed:   Recent Results (from the past 240 hour(s))  Culture, blood (Routine x 2)     Status: None   Collection Time: 09/14/19 10:22 PM   Specimen: BLOOD  Result Value Ref Range Status   Specimen Description BLOOD LEFT ANTECUBITAL  Final   Special Requests   Final    BOTTLES DRAWN AEROBIC AND ANAEROBIC Blood Culture adequate volume   Culture   Final    NO GROWTH 5 DAYS Performed at [facility], [address], [place], [place] [address]    Report Status 09/19/2019 FINAL  Final  SARS Coronavirus 2 by RT PCR (hospital order, performed in [facility] hospital lab) Nasopharyngeal Nasopharyngeal Swab     Status: None   Collection Time: 09/14/19 10:40 PM   Specimen: Nasopharyngeal Swab  Result Value Ref Range Status   SARS Coronavirus 2 NEGATIVE NEGATIVE Final    Comment: (NOTE) SARS-CoV-2 target nucleic acids are NOT DETECTED. The SARS-CoV-2 RNA is generally detectable in upper and lower respiratory specimens during the acute phase of infection. The lowest concentration of SARS-CoV-2 viral copies this assay can detect is 250 copies / mL. A negative result does not preclude SARS-CoV-2 infection and should not be used as the sole basis for treatment or other patient management decisions.  A negative result may occur with improper specimen collection / handling, submission of specimen other than nasopharyngeal swab, presence of viral mutation(s) within the areas targeted by this assay, and inadequate number of viral copies (<250  copies / mL). A negative result must be combined with clinical observations, patient history, and epidemiological information. Fact Sheet for Patients:   StrictlyIdeas.no Fact Sheet for Healthcare Providers: BankingDealers.co.za This test is not yet approved or cleared  by the [place] FDA and has been authorized for detection and/or diagnosis of SARS-CoV-2 by FDA under an Emergency Use Authorization (EUA).  This EUA will remain in effect (meaning this test can be used) for the duration of the COVID-19 declaration under Section 564(b)(1) of the Act, 21 U.S.C. section 360bbb-3(b)(1), unless the authorization is terminated or revoked sooner. Performed at [facility], [place] [address], [place], [place] [address]   Culture, blood (Routine x 2)     Status: None   Collection Time: 09/14/19 11:58 PM   Specimen: BLOOD LEFT HAND  Result Value Ref Range Status   Specimen Description BLOOD LEFT HAND  Final   Special Requests   Final    BOTTLES DRAWN AEROBIC ONLY Blood Culture adequate volume   Culture   Final    NO GROWTH 5 DAYS Performed at [facility], [place] [address] [place], [place] [address]    Report Status 09/19/2019 FINAL  Final  Aerobic/Anaerobic Culture (surgical/deep wound)     Status: None   Collection Time: 09/15/19  4:27 PM   Specimen: Other Source; Tissue  Result Value Ref Range Status   Specimen Description WOUND RIGHT ELBOW  Final   Special Requests NONE  Final   Gram Stain   Final    ABUNDANT WBC PRESENT, PREDOMINANTLY PMN NO ORGANISMS SEEN    Culture   Final    RARE PROPIONIBACTERIUM SPECIES Standardized susceptibility testing for this organism is not available. Performed at [facility], [place] [address], [place], [place] [address]    Report Status 09/20/2019 FINAL  Final      Oswald Hillock   [facility] If 7PM-7AM, please contact night-coverage at [email or web address], Office   [phone]   09/22/2019, 4:03 PM  LOS: 8 days

## 2019-09-22 NOTE — TOC Transition Note (Signed)
Transition of Care [facility]) - CM/SW Discharge Note   Patient Details  Name: Kayla Griffin MRN: [id number] Date of Birth: [birth year]  [facility] CM/SW Contact:  Curlene Labrum, RN Phone Number: 09/22/2019, 1:26 PM   Clinical Narrative:    Case Management called [facility] and spoke with Renee Pain, CM to start insurance authorization on the patient since she is an [facility] patient.  I messaged Dr. Lisabeth Devoid and asked for PT/OT evaluation in order to assist with insurance authorization and approval.  Maple Pauline Aus, Rosario Adie started the insurance authorization.   Final next level of care: [place] Barriers to Discharge: No Barriers Identified   Patient Goals and CMS Choice Patient states their goals for this hospitalization and ongoing recovery are:: Patient would like to be discharged on po antibiotics and home health - refusing SNF placement and IV antibiotics. CMS Medicare.gov Compare Post Acute Care list provided to:: Patient Choice offered to / list presented to : Patient  Discharge Placement                       Discharge Plan and Services   Discharge Planning Services: CM Consult Post Acute Care Choice: Home Health                    HH Arranged: PT, OT [place] Agency: [place]        Social Determinants of Health (SDOH) Interventions     Readmission Risk Interventions Readmission Risk Prevention Plan 09/18/2019  Transportation Screening Complete  PCP or Specialist Appt within 5-7 Days Complete  Home Care Screening Complete  Medication Review (RN CM) Complete  Some recent data might be hidden

## 2019-09-22 NOTE — Progress Notes (Signed)
   Subjective:  Patient reports pain as improved since drainage started.  Objective:   VITALS:   Vitals:   09/21/19 1408 09/21/19 1948 09/22/19 0306 09/22/19 0736  BP: 133/76 (!) 147/73 (!) 142/82 (!) 141/79  Pulse: 77 77 64 77  Resp: 14 17 17 18   Temp: 97.8 F (36.6 C) 98.1 F (36.7 C) 97.8 F (36.6 C) 98 F (36.7 C)  TempSrc: Oral Oral Oral Oral  SpO2: 99% 95% 95% 96%  Weight:      Height:       Surgical incision c/d/i 2 small areas on the lateral side of elbow with fibrinous exudate, good bleeding base Significant improvement in swelling, cellulitis and pain   Lab Results  Component Value Date   WBC 10.2 09/21/2019   HGB 9.9 (L) 09/21/2019   HCT 30.9 (L) 09/21/2019   MCV 89.0 09/21/2019   PLT 271 09/21/2019     Assessment/Plan:  7 Days Post-Op   - dressings changed to ABD and kerlix and ACE - she will need to continue this twice a day until wounds are healed - may dc to SNF from my stand point - f/u in office in 2 weeks  Eduard Roux 09/22/2019, 9:15 AM [phone]

## 2019-09-23 LAB — GLUCOSE, CAPILLARY
Glucose-Capillary: 177 mg/dL — ABNORMAL HIGH (ref 70–99)
Glucose-Capillary: 290 mg/dL — ABNORMAL HIGH (ref 70–99)
Glucose-Capillary: 198 mg/dL — ABNORMAL HIGH (ref 70–99)
Glucose-Capillary: 242 mg/dL — ABNORMAL HIGH (ref 70–99)

## 2019-09-23 NOTE — Progress Notes (Signed)
Inpatient Diabetes Program Recommendations  AACE/ADA: New Consensus Statement on Inpatient Glycemic Control (2015)  Target Ranges:  Prepandial:   less than 140 mg/dL      Peak postprandial:   less than 180 mg/dL (1-2 hours)      Critically ill patients:  140 - 180 mg/dL   Results for DAVIDA, FALCONI (MRN [id number]) as of 09/23/2019 13:05  Ref. Range 09/22/2019 07:33 09/22/2019 11:43 09/22/2019 16:29 09/22/2019 20:56 09/23/2019 07:26 09/23/2019 11:45  Glucose-Capillary Latest Ref Range: 70 - 99 mg/dL 151 (H) 240 (H) 196 (H) 233 (H) 177 (H) 290 (H)    Admit with: Cellulitis of right upper extremity with purulent drainage  History: DM  Home DM Meds: 70/30 Insulin 30 units BID       Metformin 500 mg BID  Current Orders: Lantus 9 units qhs      Novolog Sensitive Correction Scale/ SSI (0-9 units) Q4 hours    Please consider the following:  Start Novolog Meal Coverage: Novolog 4 units TID with meals  (Please add the following Hold Parameters: Hold if pt eats <50% of meal, Hold if pt NPO)   --Will follow patient during hospitalization--  Tama Headings RN, MSN, BC-ADM Inpatient Diabetes Coordinator Team Pager [phone] (8a-5p)

## 2019-09-23 NOTE — Progress Notes (Signed)
[facility]  PROGRESS NOTE  Kayla Griffin QXI:[id number] DOB: 1940/09/08 DOA: 09/14/2019 PCP: Aretta Nip, MD   Brief HPI:   79 year old female with history of diabetes mellitus type 2, hypertension, hyperlipidemia, endometrial carcinoma status post hysterectomy, supracondylar right humerus fracture s/p ORIF on 08/25/2019 came with increasing redness around surgical site, no drainage despite starting Augmentin a few days ago.  She underwent ORIF on right humerus on 08/25/2019, developed erythema at the site increasing pain and was seen by orthopedic surgeon on 6 3.  She was started on Augmentin for concern for cellulitis.  Unfortunately condition worsen and she developed drainage from the site.  She required excision debridement including skin subcutaneous tissue muscle and bone with pus.  Culture showed no growth.  She was started on IV vancomycin.  ID was consulted for duration of treatment.  she was discharged home yesterday however she did not go because she felt very weak.  Earlier she had refused to go to skilled nursing facility, now agreeable to go to skilled nursing facility for rehab.  Discharge was canceled.   Subjective   Patient seen and examined, denies any pain in the arm.   Assessment/Plan:    1. Cellulitis/abscess of right upper extremity-she presented with progressive erythema involving surgical site, draining in setting of ORIF performed on 08/25/2019.  Blood cultures were negative.  Orthopedics was consulted, she underwent excisional debridement of right upper arm and elbow on 09/15/2019.  She was started on vancomycin and plan was to send her home on 6 weeks of IV antibiotics however patient did not want IV therapy at home and also could not afford IV dalbavancin once a week.  So antibiotics were changed to Keflex 500 mg 4 times a day and doxycycline 100 g p.o. twice daily for 6 weeks.  Stop date 10/26/2019.  2. Right upper extremity pain-right upper extremity in  dressing, she is afebrile.  Drainage noted from the right arm wound, old dressing was wet.  Orthopedics was consulted.  Patient dressing was changed, mild fibrinous exudate was expressed at the bedside by orthopedic surgeon.  Continue dressing changes twice a day.  Patient's pain improved after the procedure.   She is currently on antibiotics.  3. Hypertension-blood pressure is stable, continue ramipril, metoprolol. 4. Lactic acidosis/hyponatremia-resolved, patient was placed on IV fluid hydration.  Sodium was 127 at the time of admission, improved to 134 as of 09/16/2019 5. Diabetes mellitus type 2-continue sliding scale insulin with NovoLog.  Patient is on NovoLog 70/30   25 units subcu twice daily. 6. Obesity-BMI 35 kg/m  Pressure Injury 09/15/19 Buttocks Left Stage 2 -  Partial thickness loss of dermis presenting as a shallow open injury with a red, pink wound bed without slough. (Active)  09/15/19 0100  Location: Buttocks  Location Orientation: Left  Staging: Stage 2 -  Partial thickness loss of dermis presenting as a shallow open injury with a red, pink wound bed without slough.  Wound Description (Comments):   Present on Admission: Yes        SpO2: 96 % O2 Flow Rate (L/min): 6 L/min   COVID-19 Labs  No results for input(s): DDIMER, FERRITIN, LDH, CRP in the last 72 hours.  Lab Results  Component Value Date   SARSCOV2NAA NEGATIVE 09/14/2019   Arrey NEGATIVE 08/22/2019     CBG: Recent Labs  Lab 09/22/19 1143 09/22/19 1629 09/22/19 2056 09/23/19 0726 09/23/19 1145  GLUCAP 240* 196* 233* 177* 290*    CBC: Recent Labs  Lab 09/21/19 1306  WBC 10.2  HGB 9.9*  HCT 30.9*  MCV 89.0  PLT 951    Basic Metabolic Panel: No results for input(s): NA, K, CL, CO2, GLUCOSE, BUN, CREATININE, CALCIUM, MG, PHOS in the last 168 hours.   Liver Function Tests: No results for input(s): AST, ALT, ALKPHOS, BILITOT, PROT, ALBUMIN in the last 168 hours.      DVT  prophylaxis: SCDs  Code Status: Full code  Family Communication: No family at bedside    Status is: Inpatient  Dispo: The patient is from: Home              Anticipated d/c is to: Home              Anticipated d/c date is: 09/24/2019             Patient is medically stable for discharge  Barrier to discharge-awaiting bed at skilled nursing facility  Pressure Injury 09/15/19 Buttocks Left Stage 2 -  Partial thickness loss of dermis presenting as a shallow open injury with a red, pink wound bed without slough. (Active)  09/15/19 0100  Location: Buttocks  Location Orientation: Left  Staging: Stage 2 -  Partial thickness loss of dermis presenting as a shallow open injury with a red, pink wound bed without slough.  Wound Description (Comments):   Present on Admission: Yes       Scheduled medications:  . cephALEXin  500 mg Oral Q6H  . doxycycline  100 mg Oral Q12H  . insulin aspart  0-9 Units Subcutaneous Q4H  . insulin glargine  9 Units Subcutaneous QHS  . metoprolol tartrate  100 mg Oral BID  . ramipril  20 mg Oral Daily  . sodium chloride flush  3 mL Intravenous Once    Consultants:  Orthopedics  Infectious disease  Procedures:  Excisional debridement of skin for right upper extremity abscess  Antibiotics:   Anti-infectives (From admission, onward)   Start     Dose/Rate Route Frequency Ordered Stop   09/20/19 1200  cephALEXin (KEFLEX) capsule 500 mg     Discontinue     500 mg Oral Every 6 hours 09/20/19 0845     09/20/19 1000  doxycycline (VIBRA-TABS) tablet 100 mg     Discontinue     100 mg Oral Every 12 hours 09/20/19 0845     09/18/19 1130  dalbavancin (DALVANCE) 500 mg in dextrose 5 % 250 mL IVPB        500 mg 500 mL/hr over 30 Minutes Intravenous  Once 09/18/19 1109 09/18/19 1300   09/18/19 1100  dalbavancin (DALVANCE) 1,000 mg in dextrose 5 % 250 mL IVPB        1,000 mg 500 mL/hr over 30 Minutes Intravenous  Once 09/18/19 1002 09/18/19 1200   09/18/19  0000  cephALEXin (KEFLEX) 500 MG capsule  Status:  Discontinued        500 mg Oral 4 times daily 09/18/19 1007 09/18/19    09/18/19 0000  doxycycline (ADOXA) 100 MG tablet     Discontinue     100 mg Oral 2 times daily 09/18/19 1200 10/26/19 2359   09/18/19 0000  cephALEXin (KEFLEX) 500 MG capsule     Discontinue     500 mg Oral 4 times daily 09/18/19 1204 10/26/19 2359   09/16/19 1600  cefTRIAXone (ROCEPHIN) 2 g in sodium chloride 0.9 % 100 mL IVPB  Status:  Discontinued        2 g 200 mL/hr over  30 Minutes Intravenous Every 24 hours 09/16/19 1535 09/18/19 1420   09/15/19 2300  vancomycin (VANCOREADY) IVPB 1500 mg/300 mL  Status:  Discontinued        1,500 mg 150 mL/hr over 120 Minutes Intravenous Every 24 hours 09/14/19 2332 09/18/19 1002   09/15/19 1636  vancomycin (VANCOCIN) powder  Status:  Discontinued          As needed 09/15/19 1636 09/15/19 1656   09/14/19 2345  vancomycin (VANCOREADY) IVPB 2000 mg/400 mL        2,000 mg 200 mL/hr over 120 Minutes Intravenous  Once 09/14/19 2326 09/15/19 0217       Objective   Vitals:   09/22/19 1424 09/22/19 1942 09/23/19 0308 09/23/19 0815  BP: (!) 146/68 131/60 (!) 145/73 (!) 141/74  Pulse: 61 75 67 70  Resp: 17 18 15 17   Temp: 98.1 F (36.7 C) 98.2 F (36.8 C) 98.2 F (36.8 C) 98 F (36.7 C)  TempSrc: Oral Oral Oral Oral  SpO2: 94% 98% 94% 96%  Weight:      Height:        Intake/Output Summary (Last 24 hours) at 09/23/2019 1216 Last data filed at 09/23/2019 0700 Gross per 24 hour  Intake 240 ml  Output 1450 ml  Net -1210 ml    06/13 1901 - 06/15 0700 In: 240 [P.O.:240] Out: 3350 [Urine:3350]  Filed Weights   09/14/19 2004 09/15/19 0316 09/21/19 0500  Weight: 97.1 kg 92.5 kg 91.6 kg    Physical Examination:   General-appears in no acute distress Heart-S1-S2, regular, no murmur auscultated Lungs-clear to auscultation bilaterally, no wheezing or crackles auscultated Abdomen-soft, nontender, no organomegaly  Extremities-right upper extremity in dressing Neuro-alert, oriented x3, no focal deficit noted   Data Reviewed:   Recent Results (from the past 240 hour(s))  Culture, blood (Routine x 2)     Status: None   Collection Time: 09/14/19 10:22 PM   Specimen: BLOOD  Result Value Ref Range Status   Specimen Description BLOOD LEFT ANTECUBITAL  Final   Special Requests   Final    BOTTLES DRAWN AEROBIC AND ANAEROBIC Blood Culture adequate volume   Culture   Final    NO GROWTH 5 DAYS Performed at [facility], [address], [place], [place] [address]    Report Status 09/19/2019 FINAL  Final  SARS Coronavirus 2 by RT PCR (hospital order, performed in [facility] hospital lab) Nasopharyngeal Nasopharyngeal Swab     Status: None   Collection Time: 09/14/19 10:40 PM   Specimen: Nasopharyngeal Swab  Result Value Ref Range Status   SARS Coronavirus 2 NEGATIVE NEGATIVE Final    Comment: (NOTE) SARS-CoV-2 target nucleic acids are NOT DETECTED. The SARS-CoV-2 RNA is generally detectable in upper and lower respiratory specimens during the acute phase of infection. The lowest concentration of SARS-CoV-2 viral copies this assay can detect is 250 copies / mL. A negative result does not preclude SARS-CoV-2 infection and should not be used as the sole basis for treatment or other patient management decisions.  A negative result may occur with improper specimen collection / handling, submission of specimen other than nasopharyngeal swab, presence of viral mutation(s) within the areas targeted by this assay, and inadequate number of viral copies (<250 copies / mL). A negative result must be combined with clinical observations, patient history, and epidemiological information. Fact Sheet for Patients:   StrictlyIdeas.no Fact Sheet for Healthcare Providers: BankingDealers.co.za This test is not yet approved or cleared  by  the [employer] and has  been authorized for detection and/or diagnosis of SARS-CoV-2 by FDA under an Emergency Use Authorization (EUA).  This EUA will remain in effect (meaning this test can be used) for the duration of the COVID-19 declaration under Section 564(b)(1) of the Act, 21 U.S.C. section 360bbb-3(b)(1), unless the authorization is terminated or revoked sooner. Performed at [facility], [place] [address] [place], [place] [address]   Culture, blood (Routine x 2)     Status: None   Collection Time: 09/14/19 11:58 PM   Specimen: BLOOD LEFT HAND  Result Value Ref Range Status   Specimen Description BLOOD LEFT HAND  Final   Special Requests   Final    BOTTLES DRAWN AEROBIC ONLY Blood Culture adequate volume   Culture   Final    NO GROWTH 5 DAYS Performed at [facility], [place] [address], [place], [place] [address]    Report Status 09/19/2019 FINAL  Final  Aerobic/Anaerobic Culture (surgical/deep wound)     Status: None   Collection Time: 09/15/19  4:27 PM   Specimen: Other Source; Tissue  Result Value Ref Range Status   Specimen Description WOUND RIGHT ELBOW  Final   Special Requests NONE  Final   Gram Stain   Final    ABUNDANT WBC PRESENT, PREDOMINANTLY PMN NO ORGANISMS SEEN    Culture   Final    RARE PROPIONIBACTERIUM SPECIES Standardized susceptibility testing for this organism is not available. Performed at [facility], [place] [address] [place], [place] [address]    Report Status 09/20/2019 FINAL  Final      Oswald Hillock   [facility] If 7PM-7AM, please contact night-coverage at [email or web address], Office  [phone]   09/23/2019, 12:16 PM  LOS: 9 days

## 2019-09-23 NOTE — Evaluation (Addendum)
Physical Therapy Evaluation Patient Details Name: Kayla Griffin MRN: [id number] DOB: 07/24/1940 Today's Date: 09/23/2019   History of Present Illness  Patient is a 79 y/o female who presents with RUE/elbow abscess s/p I&D 6/7. PMH includes DM, HTN, glaucoma, endometrial carcinoma status post hysterectomy, supracondylar right humerus fracture s/p ORIF on 08/25/2019.  Clinical Impression  Patient presents with generalized weakness, deconditioning, impaired balance, swelling RUE and impaired mobility s/p above. Pt lives at home and is Mod I with [facility] for ambulation PTA. Today, pt requires Mod A for bed mobility and transfers. Able to take a few steps to get to chair with use of RW and support. Requires cues to adhere to NWB RUE and has difficulty doing so with mobility. Encouraged elevation to help with swelling. Fatigues and reports LE weakness putting pt at increased risk for falls. Would benefit from SNF to maximize independence and mobility prior to return home. Will follow acutely.     Follow Up Recommendations SNF;Supervision for mobility/OOB    Equipment Recommendations  None recommended by PT    Recommendations for Other Services       Precautions / Restrictions Precautions Precautions: Fall Restrictions Weight Bearing Restrictions: Yes RUE Weight Bearing: Non weight bearing      Mobility  Bed Mobility Overal bed mobility: Needs Assistance Bed Mobility: Supine to Sit     Supine to sit: Min assist;HOB elevated     General bed mobility comments: ASsist with trunk to get to EOB.  Transfers Overall transfer level: Needs assistance Equipment used: Rolling walker (2 wheeled) Transfers: Sit to/from Stand Sit to Stand: Mod assist         General transfer comment: Assist to power to standing with use of momentum and RW.  Ambulation/Gait Ambulation/Gait assistance: Min guard Gait Distance (Feet): 7 Feet Assistive device: Rolling walker (2 wheeled) Gait  Pattern/deviations: Step-to pattern;Decreased stride length;Shuffle;Wide base of support Gait velocity: decreased   General Gait Details: Slow, unsteady and stiff like gait with use of RW with decreased foot clearance bilaterally. Fatigues. Weakness BLEs.  Stairs            Wheelchair Mobility    Modified Rankin (Stroke Patients Only)       Balance Overall balance assessment: Needs assistance Sitting-balance support: Feet supported;No upper extremity supported Sitting balance-Leahy Scale: Fair     Standing balance support: During functional activity Standing balance-Leahy Scale: Poor Standing balance comment: Requires UE support ins tanding.                             Pertinent Vitals/Pain Pain Assessment: No/denies pain    Home Living Family/patient expects to be discharged to:: Skilled nursing facility Living Arrangements: Alone Available Help at Discharge: Friend(s);Family;Available PRN/intermittently Type of Home: House Home Access: Stairs to enter   Entrance Stairs-Number of Steps: 1 Home Layout: Two level;Bed/bath upstairs Home Equipment: Walker - 2 wheels;Cane - single point;Bedside commode      Prior Function Level of Independence: Independent with assistive device(s)         Comments: Drives, does her own ADLs. Uses SPC for ambulation.     Hand Dominance   Dominant Hand: Right    Extremity/Trunk Assessment   Upper Extremity Assessment Upper Extremity Assessment: RUE deficits/detail RUE Deficits / Details: swelling present dorsum of hand and lower arm RUE Sensation: WNL    Lower Extremity Assessment Lower Extremity Assessment: Generalized weakness RLE Deficits / Details: Hx of knee replacment  LLE Sensation: decreased light touch (foot)       Communication   Communication: No difficulties  Cognition Arousal/Alertness: Awake/alert Behavior During Therapy: WFL for tasks assessed/performed Overall Cognitive Status: Within  Functional Limits for tasks assessed                                 General Comments: "I am a very stubborn woman"      General Comments      Exercises     Assessment/Plan    PT Assessment Patient needs continued PT services  PT Problem List Decreased strength;Decreased mobility;Decreased balance;Impaired sensation;Decreased activity tolerance;Decreased skin integrity;Decreased range of motion;Decreased knowledge of precautions       PT Treatment Interventions Therapeutic activities;Gait training;Therapeutic exercise;Patient/family education;Balance training;Functional mobility training;DME instruction    PT Goals (Current goals can be found in the Care Plan section)  Acute Rehab PT Goals Patient Stated Goal: to get to rehab so i can return to home and independence PT Goal Formulation: With patient Time For Goal Achievement: 10/07/19 Potential to Achieve Goals: Good    Frequency Min 3X/week   Barriers to discharge Decreased caregiver support lives alone    Co-evaluation               AM-PAC PT "6 Clicks" Mobility  Outcome Measure Help needed turning from your back to your side while in a flat bed without using bedrails?: None Help needed moving from lying on your back to sitting on the side of a flat bed without using bedrails?: A Little Help needed moving to and from a bed to a chair (including a wheelchair)?: A Lot Help needed standing up from a chair using your arms (e.g., wheelchair or bedside chair)?: A Lot Help needed to walk in hospital room?: A Little Help needed climbing 3-5 steps with a railing? : Total 6 Click Score: 15    End of Session Equipment Utilized During Treatment: Gait belt Activity Tolerance: Patient limited by fatigue Patient left: in chair;with call bell/phone within reach;with chair alarm set Nurse Communication: Mobility status PT Visit Diagnosis: Muscle weakness (generalized) (M62.81);Difficulty in walking, not  elsewhere classified (R26.2);Unsteadiness on feet (R26.81)    Time: 9379-0240 PT Time Calculation (min) (ACUTE ONLY): 26 min   Charges:   PT Evaluation $PT Eval Moderate Complexity: 1 Mod PT Treatments $Therapeutic Activity: 8-22 mins        Marisa Severin, PT, DPT Acute Rehabilitation Services Pager [phone] Office [phone]      Marguarite Arbour A Sabra Heck 09/23/2019, 10:29 AM

## 2019-09-24 LAB — GLUCOSE, CAPILLARY
Glucose-Capillary: 161 mg/dL — ABNORMAL HIGH (ref 70–99)
Glucose-Capillary: 204 mg/dL — ABNORMAL HIGH (ref 70–99)
Glucose-Capillary: 146 mg/dL — ABNORMAL HIGH (ref 70–99)
Glucose-Capillary: 178 mg/dL — ABNORMAL HIGH (ref 70–99)

## 2019-09-24 LAB — SARS CORONAVIRUS 2 (TAT 6-24 HRS): SARS Coronavirus 2: NEGATIVE

## 2019-09-24 NOTE — Plan of Care (Signed)

## 2019-09-24 NOTE — Progress Notes (Signed)
Physical Therapy Treatment Patient Details Name: Kayla Griffin MRN: [id number] DOB: 1940-05-06 Today's Date: 09/24/2019    History of Present Illness Patient is a 79 y/o female who presents with RUE/elbow abscess s/p I&D 6/7. PMH includes DM, HTN, glaucoma, endometrial carcinoma status post hysterectomy, supracondylar right humerus fracture s/p ORIF on 08/25/2019.    PT Comments    Pt progressing slowly towards her physical therapy goals. Requiring min assist for transfers, ambulating 10 feet with a walker. Rest of session focused on seated therapeutic exercises. Continues with poor endurance, balance deficits, debility/generalized weakness. Presents as a high fall risk secondary to decreased gait speed and history of falls. Continue to recommend SNF for ongoing Physical Therapy.      Follow Up Recommendations  SNF;Supervision for mobility/OOB     Equipment Recommendations  Wheelchair (measurements PT);Wheelchair cushion (measurements PT)    Recommendations for Other Services       Precautions / Restrictions Precautions Precautions: Fall Restrictions Weight Bearing Restrictions: Yes RUE Weight Bearing: Non weight bearing Other Position/Activity Restrictions: Ok for light weightbearing through RUE for walker use only    Mobility  Bed Mobility Overal bed mobility: Needs Assistance Bed Mobility: Supine to Sit     Supine to sit: Min guard     General bed mobility comments: HOB elevated, no physical assist to get edge of bed  Transfers Overall transfer level: Needs assistance Equipment used: Rolling walker (2 wheeled) Transfers: Sit to/from Stand Sit to Stand: Min assist         General transfer comment: MinA to power up to stand, cues for hand placement  Ambulation/Gait Ambulation/Gait assistance: Min guard Gait Distance (Feet): 10 Feet Assistive device: Rolling walker (2 wheeled) Gait Pattern/deviations: Step-to pattern;Decreased stride length;Shuffle;Wide base  of support;Trunk flexed Gait velocity: decreased Gait velocity interpretation: <1.31 ft/sec, indicative of household ambulator General Gait Details: Min guard for safety. Cues for upright posture. Slow, effortful gait with increased trunk/hip flexion. Decreased bilateral foot clearance. Fatigues easily.   Stairs             Wheelchair Mobility    Modified Rankin (Stroke Patients Only)       Balance Overall balance assessment: Needs assistance Sitting-balance support: Feet supported;No upper extremity supported Sitting balance-Leahy Scale: Good     Standing balance support: Bilateral upper extremity supported;During functional activity Standing balance-Leahy Scale: Poor Standing balance comment: Requires UE support in  standing.                            Cognition Arousal/Alertness: Awake/alert Behavior During Therapy: WFL for tasks assessed/performed Overall Cognitive Status: Within Functional Limits for tasks assessed                                        Exercises General Exercises - Upper Extremity Digit Composite Flexion: Right;10 reps;Seated Composite Extension: Right;10 reps;Seated General Exercises - Lower Extremity Long Arc Quad: Both;10 reps;Seated Heel Raises: Both;15 reps;Seated    General Comments        Pertinent Vitals/Pain Pain Assessment: Faces Faces Pain Scale: Hurts little more Pain Location: RUE Pain Descriptors / Indicators: Aching;Grimacing;Guarding Pain Intervention(s): Limited activity within patient's tolerance;Monitored during session;Repositioned    Home Living                      Prior Function  PT Goals (current goals can now be found in the care plan section) Acute Rehab PT Goals Patient Stated Goal: to get to rehab so i can return to home and independence Potential to Achieve Goals: Good Progress towards PT goals: Progressing toward goals    Frequency    Min 3X/week       PT Plan Current plan remains appropriate    Co-evaluation              AM-PAC PT "6 Clicks" Mobility   Outcome Measure  Help needed turning from your back to your side while in a flat bed without using bedrails?: None Help needed moving from lying on your back to sitting on the side of a flat bed without using bedrails?: A Little Help needed moving to and from a bed to a chair (including a wheelchair)?: A Little Help needed standing up from a chair using your arms (e.g., wheelchair or bedside chair)?: A Little Help needed to walk in hospital room?: A Little Help needed climbing 3-5 steps with a railing? : A Lot 6 Click Score: 18    End of Session   Activity Tolerance: Patient limited by fatigue Patient left: in chair;with call bell/phone within reach;with chair alarm set Nurse Communication: Mobility status PT Visit Diagnosis: Muscle weakness (generalized) (M62.81);Difficulty in walking, not elsewhere classified (R26.2);Unsteadiness on feet (R26.81)     Time: 6644-0347 PT Time Calculation (min) (ACUTE ONLY): 30 min  Charges:  $Therapeutic Activity: 23-37 mins                       Kayla Griffin, PT, DPT Acute Rehabilitation Services Pager [phone] Office [phone]    Kayla Griffin 09/24/2019, 3:13 PM

## 2019-09-24 NOTE — Progress Notes (Signed)
[facility] PROGRESS NOTE    Progress Note  Kayla Griffin  ZOX:[id number] DOB: 02-Aug-1940 DOA: 09/14/2019 PCP: Kayla Nip, MD     Brief Narrative:   Kayla Griffin is an 79 y.o. female past medical history of diabetes mellitus type 2, essential hypertension, endometrial carcinoma status post hysterectomy, supracondylar right humerus fracture status post ORIF on 08/25/2019 comes in with increased redness around the surgical site, he had been on Augmentin a few days prior to admission.  Despite Augmentin his condition worsened and he required excision and debridement, culture data has remained negative till date ID was consulted, he was started on IV vancomycin  Assessment/Plan:   Cellulitis/Abscess of right upper arm and forearm: Status post I&D on 09/15/2019 was started empirically on IV vancomycin for 6 weeks, ID was consulted recommended to change her to Keflex 500 mg twice a day and doxycycline for 6 weeks stop date is 10/26/2019. He was started on IV vancomycin and was to be sent home on 6 weeks of IV antibiotics however the patient did not want IV dalbavancin for 1 week Physical therapy evaluated the patient recommended skilled nursing facility, the patient is medically stable for transfer.  Right upper extremity pain: Orthopedic surgery evaluated the upper extremity they relate it is mild fibrinous exudate, continue dressing changes twice a day.  Is currently empirically on IV antibiotics.  Essential hypertension: Continue current medication control.  Hypovolemic hyponatremia: Resolved with IV fluid hydration.  Insulin-dependent diabetes mellitus type 2: Continue 70/30 twice a day.  Obesity: Counseling.  RN Pressure Injury Documentation: Pressure Injury 09/15/19 Buttocks Left Stage 2 -  Partial thickness loss of dermis presenting as a shallow open injury with a red, pink wound bed without slough. (Active)  09/15/19 0100  Location: Buttocks  Location  Orientation: Left  Staging: Stage 2 -  Partial thickness loss of dermis presenting as a shallow open injury with a red, pink wound bed without slough.  Wound Description (Comments):   Present on Admission: Yes    Estimated body mass index is 34.66 kg/m as calculated from the following:   Height as of this encounter: 5\' 4"  (1.626 m).   Weight as of this encounter: 91.6 kg.    DVT prophylaxis: lovenox Family Communication:Daughter Status is: Inpatient  Remains inpatient appropriate because:Unsafe d/c plan   Dispo: The patient is from: Home              Anticipated d/c is to: SNF              Anticipated d/c date is: 1 day              Patient currently is medically stable to d/c.        Code Status:     Code Status Orders  (From admission, onward)         Start     Ordered   09/15/19 0020  Full code  Continuous        09/15/19 0019        Code Status History    Date Active Date Inactive Code Status Order ID Comments User Context   07/07/2016 1744 07/10/2016 1833 Full Code [id number]  Leandrew Koyanagi, MD Inpatient   Advance Care Planning Activity        IV Access:    Peripheral IV   Procedures and diagnostic studies:   No results found.   Medical Consultants:    None.  Anti-Infectives:   Keflex and doxycycline  Subjective:    Kayla Griffin she relates her pain is controlled her last bowel movement was last week.  Objective:    Vitals:   09/23/19 1515 09/23/19 1936 09/24/19 0319 09/24/19 0801  BP: (!) 152/85 (!) 148/80 (!) 147/73 (!) 154/83  Pulse: 67 69 67 65  Resp: 16 17 15 16   Temp: 98.7 F (37.1 C) 97.7 F (36.5 C) 98.1 F (36.7 C) (!) 97.5 F (36.4 C)  TempSrc: Oral Oral Oral Oral  SpO2: 98% 95% 97% 98%  Weight:      Height:       SpO2: 98 % O2 Flow Rate (L/min): 6 L/min   Intake/Output Summary (Last 24 hours) at 09/24/2019 0846 Last data filed at 09/24/2019 0300 Gross per 24 hour  Intake 480 ml  Output 1500 ml  Net  -1020 ml   Filed Weights   09/14/19 2004 09/15/19 0316 09/21/19 0500  Weight: 97.1 kg 92.5 kg 91.6 kg    Exam: General exam: In no acute distress. Respiratory system: Good air movement and clear to auscultation. Cardiovascular system: S1 & S2 heard, RRR. No JVD, murmurs, rubs, gallops or clicks.  Gastrointestinal system: Abdomen is nondistended, soft and nontender.  Extremities: Upper extremity is wrapped. Skin: No rashes, lesions or ulcers Psychiatry: Judgement and insight appear normal.    Data Reviewed:    Labs: Basic Metabolic Panel: No results for input(s): NA, K, CL, CO2, GLUCOSE, BUN, CREATININE, CALCIUM, MG, PHOS in the last 168 hours. GFR Estimated Creatinine Clearance: 63.6 mL/min (by C-G formula based on SCr of 0.6 mg/dL). Liver Function Tests: No results for input(s): AST, ALT, ALKPHOS, BILITOT, PROT, ALBUMIN in the last 168 hours. No results for input(s): LIPASE, AMYLASE in the last 168 hours. No results for input(s): AMMONIA in the last 168 hours. Coagulation profile No results for input(s): INR, PROTIME in the last 168 hours. COVID-19 Labs  No results for input(s): DDIMER, FERRITIN, LDH, CRP in the last 72 hours.  Lab Results  Component Value Date   SARSCOV2NAA NEGATIVE 09/14/2019   [place] NEGATIVE 08/22/2019    CBC: Recent Labs  Lab 09/21/19 1306  WBC 10.2  HGB 9.9*  HCT 30.9*  MCV 89.0  PLT 271   Cardiac Enzymes: No results for input(s): CKTOTAL, CKMB, CKMBINDEX, TROPONINI in the last 168 hours. BNP (last 3 results) No results for input(s): PROBNP in the last 8760 hours. CBG: Recent Labs  Lab 09/23/19 0726 09/23/19 1145 09/23/19 1656 09/23/19 2059 09/24/19 0802  GLUCAP 177* 290* 198* 242* 161*   D-Dimer: No results for input(s): DDIMER in the last 72 hours. Hgb A1c: No results for input(s): HGBA1C in the last 72 hours. Lipid Profile: No results for input(s): CHOL, HDL, LDLCALC, TRIG, CHOLHDL, LDLDIRECT in the last 72  hours. Thyroid function studies: No results for input(s): TSH, T4TOTAL, T3FREE, THYROIDAB in the last 72 hours.  Invalid input(s): FREET3 Anemia work up: No results for input(s): VITAMINB12, FOLATE, FERRITIN, TIBC, IRON, RETICCTPCT in the last 72 hours. Sepsis Labs: Recent Labs  Lab 09/21/19 1306  WBC 10.2   Microbiology Recent Results (from the past 240 hour(s))  Culture, blood (Routine x 2)     Status: None   Collection Time: 09/14/19 10:22 PM   Specimen: BLOOD  Result Value Ref Range Status   Specimen Description BLOOD LEFT ANTECUBITAL  Final   Special Requests   Final    BOTTLES DRAWN AEROBIC AND ANAEROBIC Blood Culture adequate volume   Culture   Final  NO GROWTH 5 DAYS Performed at [facility], [place] [address] [place], [place] [address]    Report Status 09/19/2019 FINAL  Final  SARS Coronavirus 2 by RT PCR (hospital order, performed in [facility] hospital lab) Nasopharyngeal Nasopharyngeal Swab     Status: None   Collection Time: 09/14/19 10:40 PM   Specimen: Nasopharyngeal Swab  Result Value Ref Range Status   SARS Coronavirus 2 NEGATIVE NEGATIVE Final    Comment: (NOTE) SARS-CoV-2 target nucleic acids are NOT DETECTED. The SARS-CoV-2 RNA is generally detectable in upper and lower respiratory specimens during the acute phase of infection. The lowest concentration of SARS-CoV-2 viral copies this assay can detect is 250 copies / mL. A negative result does not preclude SARS-CoV-2 infection and should not be used as the sole basis for treatment or other patient management decisions.  A negative result may occur with improper specimen collection / handling, submission of specimen other than nasopharyngeal swab, presence of viral mutation(s) within the areas targeted by this assay, and inadequate number of viral copies (<250 copies / mL). A negative result must be combined with clinical observations, patient history, and epidemiological information. Fact Sheet  for Patients:   StrictlyIdeas.no Fact Sheet for Healthcare Providers: BankingDealers.co.za This test is not yet approved or cleared  by the [place] FDA and has been authorized for detection and/or diagnosis of SARS-CoV-2 by FDA under an Emergency Use Authorization (EUA).  This EUA will remain in effect (meaning this test can be used) for the duration of the COVID-19 declaration under Section 564(b)(1) of the Act, 21 U.S.C. section 360bbb-3(b)(1), unless the authorization is terminated or revoked sooner. Performed at [facility], [place] [address] [place], [place] [address]   Culture, blood (Routine x 2)     Status: None   Collection Time: 09/14/19 11:58 PM   Specimen: BLOOD LEFT HAND  Result Value Ref Range Status   Specimen Description BLOOD LEFT HAND  Final   Special Requests   Final    BOTTLES DRAWN AEROBIC ONLY Blood Culture adequate volume   Culture   Final    NO GROWTH 5 DAYS Performed at [facility], [place] [address], [place], [place] [address]    Report Status 09/19/2019 FINAL  Final  Aerobic/Anaerobic Culture (surgical/deep wound)     Status: None   Collection Time: 09/15/19  4:27 PM   Specimen: Other Source; Tissue  Result Value Ref Range Status   Specimen Description WOUND RIGHT ELBOW  Final   Special Requests NONE  Final   Gram Stain   Final    ABUNDANT WBC PRESENT, PREDOMINANTLY PMN NO ORGANISMS SEEN    Culture   Final    RARE PROPIONIBACTERIUM SPECIES Standardized susceptibility testing for this organism is not available. Performed at [facility], [place] [address], [place], [place] [address]    Report Status 09/20/2019 FINAL  Final     Medications:   . cephALEXin  500 mg Oral Q6H  . doxycycline  100 mg Oral Q12H  . insulin aspart  0-9 Units Subcutaneous Q4H  . insulin glargine  9 Units Subcutaneous QHS  . metoprolol tartrate  100 mg Oral BID  . ramipril  20 mg Oral Daily  . sodium  chloride flush  3 mL Intravenous Once   Continuous Infusions: . lactated ringers 10 mL/hr at 09/15/19 1440      LOS: 10 days   Charlynne Cousins  [facility]  09/24/2019, 8:46 AM

## 2019-09-25 ENCOUNTER — Telehealth: Payer: Self-pay | Admitting: Orthopaedic Surgery

## 2019-09-25 ENCOUNTER — Ambulatory Visit: Payer: Medicare HMO | Admitting: Orthopaedic Surgery

## 2019-09-25 DIAGNOSIS — S42309A Unspecified fracture of shaft of humerus, unspecified arm, initial encounter for closed fracture: Secondary | ICD-10-CM | POA: Diagnosis not present

## 2019-09-25 DIAGNOSIS — R45851 Suicidal ideations: Secondary | ICD-10-CM | POA: Diagnosis not present

## 2019-09-25 DIAGNOSIS — T8142XD Infection following a procedure, deep incisional surgical site, subsequent encounter: Secondary | ICD-10-CM | POA: Diagnosis not present

## 2019-09-25 DIAGNOSIS — G8918 Other acute postprocedural pain: Secondary | ICD-10-CM | POA: Diagnosis not present

## 2019-09-25 DIAGNOSIS — M2550 Pain in unspecified joint: Secondary | ICD-10-CM | POA: Diagnosis not present

## 2019-09-25 DIAGNOSIS — E119 Type 2 diabetes mellitus without complications: Secondary | ICD-10-CM | POA: Diagnosis not present

## 2019-09-25 DIAGNOSIS — L02413 Cutaneous abscess of right upper limb: Secondary | ICD-10-CM | POA: Diagnosis not present

## 2019-09-25 DIAGNOSIS — Z794 Long term (current) use of insulin: Secondary | ICD-10-CM | POA: Diagnosis not present

## 2019-09-25 DIAGNOSIS — I469 Cardiac arrest, cause unspecified: Secondary | ICD-10-CM | POA: Diagnosis not present

## 2019-09-25 DIAGNOSIS — I10 Essential (primary) hypertension: Secondary | ICD-10-CM | POA: Diagnosis not present

## 2019-09-25 DIAGNOSIS — S42414D Nondisplaced simple supracondylar fracture without intercondylar fracture of right humerus, subsequent encounter for fracture with routine healing: Secondary | ICD-10-CM | POA: Diagnosis not present

## 2019-09-25 DIAGNOSIS — E1122 Type 2 diabetes mellitus with diabetic chronic kidney disease: Secondary | ICD-10-CM | POA: Diagnosis not present

## 2019-09-25 DIAGNOSIS — Z7401 Bed confinement status: Secondary | ICD-10-CM | POA: Diagnosis not present

## 2019-09-25 DIAGNOSIS — L03113 Cellulitis of right upper limb: Secondary | ICD-10-CM | POA: Diagnosis not present

## 2019-09-25 DIAGNOSIS — E1165 Type 2 diabetes mellitus with hyperglycemia: Secondary | ICD-10-CM | POA: Diagnosis not present

## 2019-09-25 DIAGNOSIS — E871 Hypo-osmolality and hyponatremia: Secondary | ICD-10-CM | POA: Diagnosis not present

## 2019-09-25 DIAGNOSIS — R69 Illness, unspecified: Secondary | ICD-10-CM | POA: Diagnosis not present

## 2019-09-25 LAB — GLUCOSE, CAPILLARY
Glucose-Capillary: 129 mg/dL — ABNORMAL HIGH (ref 70–99)
Glucose-Capillary: 133 mg/dL — ABNORMAL HIGH (ref 70–99)
Glucose-Capillary: 136 mg/dL — ABNORMAL HIGH (ref 70–99)
Glucose-Capillary: 233 mg/dL — ABNORMAL HIGH (ref 70–99)

## 2019-09-25 MED ORDER — SORBITOL 70 % SOLN
960.0000 mL | TOPICAL_OIL | Freq: Once | ORAL | Status: DC
  Filled 2019-09-25 (×2): qty 473

## 2019-09-25 MED ORDER — HYDROCODONE-ACETAMINOPHEN 7.5-325 MG PO TABS: 1.0000 | ORAL_TABLET | Freq: Four times a day (QID) | ORAL | 0 refills | Status: AC | PRN

## 2019-09-25 NOTE — Telephone Encounter (Signed)
Tobie Poet from [facility] called.   The wound care center advised her to not schedule the patient's post op appointment with [place] until she has been seen by them first. Tobie Poet was calling to verify that information was correct.   Call back: [phone]

## 2019-09-25 NOTE — TOC Transition Note (Signed)
Transition of Care [facility]) - CM/SW Discharge Note   Patient Details  Name: Kayla Griffin MRN: [id number] Date of Birth: [birth year]  [facility] CM/SW Contact:  Curlene Labrum, RN Phone Number: 09/25/2019, 11:06 AM   Clinical Narrative:    Case management received insurance authorization confirmation from [facility].  The patient was notified of her transfer today and she is calling the sister to notify her as well.  PT[facility] was schedule for 1 pm to to allow time for physician to sign narcotic prescription before transfer out.  Discharge summary placed in the hub for the facility.   Final next level of care: [place] Barriers to Discharge: No Barriers Identified   Patient Goals and CMS Choice Patient states their goals for this hospitalization and ongoing recovery are:: Patient would like to be discharged on po antibiotics and home health - refusing SNF placement and IV antibiotics. CMS Medicare.gov Compare Post Acute Care list provided to:: Patient Choice offered to / list presented to : Patient  Discharge Placement                       Discharge Plan and Services   Discharge Planning Services: CM Consult Post Acute Care Choice: Home Health                    HH Arranged: PT, OT [place] Agency: [place]        Social Determinants of Health (SDOH) Interventions     Readmission Risk Interventions Readmission Risk Prevention Plan 09/18/2019  Transportation Screening Complete  PCP or Specialist Appt within 5-7 Days Complete  Home Care Screening Complete  Medication Review (RN CM) Complete  Some recent data might be hidden

## 2019-09-25 NOTE — Progress Notes (Signed)
Primary MD cleared pt to be discharged to SNF;[facility] is here to transport the pt.Pt had a BM this morning and Physical Therapist  assisted with cleaning her up.

## 2019-09-25 NOTE — Discharge Summary (Signed)
Physician Discharge Summary  Kayla Griffin ZOX:[id number] DOB: [birth year] DOA: 09/14/2019  PCP: Aretta Nip, MD  Admit date: 09/14/2019 Discharge date: 09/25/2019  Admitted From: Home Disposition:  SNF  Recommendations for Outpatient Follow-up:  1. Follow up with orthopedic in 1-2 weeks 2. Please obtain BMP/CBC in one week 3. She will continue Keflex for a total of 6 weeks.  Stop date 10/26/2019 4. Try to keep the right arm elevated above the heart level as much as possible.  Home Health:No Equipment/Devices:None  Discharge Condition:Stable CODE STATUS:Full Diet recommendation: Heart Healthy  Brief/Interim Summary: 79 y.o. female past medical history of diabetes mellitus type 2, essential hypertension, endometrial carcinoma status post hysterectomy, supracondylar right humerus fracture status post ORIF on 08/25/2019 comes in with increased redness around the surgical site, who failed outpatient antibiotic treatment.  Discharge Diagnoses:  Principal Problem:   Abscess of right upper arm and forearm Active Problems:   Hypertension   Cellulitis of right upper extremity   Insulin-requiring or dependent type II diabetes mellitus (HCC)   Hyponatremia   Postoperative infection  Cellulitis/abscess of the right upper extremity and forearm: She is status post I&D on 09/15/2018 was started empirically on IV vancomycin for 6 weeks ID was consulted who recommended to change to Keflex and she will continue this twice a day along with doxycycline and her stop date on 10/26/2019. Physical therapy evaluated the patient recommended skilled nursing facility. Orthopedic surgery recommended to continue twice a day daily dressing changes until the wound is healed.  Right upper extremity pain: Orthopedic evaluated the upper extremity and related this is fibrinous exudate continue twice a day daily dressing changes and antibiotics.  Essential hypertension: Controlled no changes made to her  medication.  Hypovolemic hyponatremia: Resolved with IV fluid hydration.  Insulin-dependent diabetes mellitus type 2: Continue 7030 as an outpatient.  Obesity: Counseling.  History of sacral decubitus ulcer stage II present on admission: Continue to turn the patient every 2 hours  Discharge Instructions  Discharge Instructions    Diet - low sodium heart healthy   Complete by: As directed    Diet - low sodium heart healthy   Complete by: As directed    Discharge wound care:   Complete by: As directed    Continue twice a day daily dressing changes until wound healed she will follow up with orthopedic surgery in 2 weeks.   Increase activity slowly   Complete by: As directed    Increase activity slowly   Complete by: As directed    Leave dressing on - Keep it clean, dry, and intact until clinic visit   Complete by: As directed      Allergies as of 09/25/2019      Reactions   Actos [pioglitazone] Swelling   SWELLING REACTION UNSPECIFIED       Medication List    STOP taking these medications   amoxicillin-clavulanate 875-125 MG tablet Commonly known as: Augmentin   oxyCODONE-acetaminophen 5-325 MG tablet Commonly known as: Percocet     TAKE these medications   alendronate 70 MG tablet Commonly known as: FOSAMAX Take 70 mg by mouth every Sunday. Take with a full glass of water on an empty stomach.   aspirin EC 325 MG tablet Take 1 tablet (325 mg total) by mouth daily.   atorvastatin 40 MG tablet Commonly known as: LIPITOR Take 40 mg by mouth daily.   CALCIUM 1200 PO Take 1,200 mg by mouth daily.   cephALEXin 500 MG capsule Commonly known  as: KEFLEX Take 1 capsule (500 mg total) by mouth 4 (four) times daily.   dorzolamide 2 % ophthalmic solution Commonly known as: TRUSOPT Place 1 drop into both eyes 2 (two) times daily.   doxycycline 100 MG tablet Commonly known as: ADOXA Take 1 tablet (100 mg total) by mouth 2 (two) times daily.    hydrochlorothiazide 25 MG tablet Commonly known as: HYDRODIURIL Take 25 mg by mouth daily.   insulin NPH-regular Human (70-30) 100 UNIT/ML injection Inject 30 Units into the skin 2 (two) times daily with a meal.   latanoprost 0.005 % ophthalmic solution Commonly known as: XALATAN Place 1 drop into both eyes at bedtime.   metFORMIN 500 MG tablet Commonly known as: GLUCOPHAGE Take 500 mg by mouth 2 (two) times daily with a meal.   methocarbamol 750 MG tablet Commonly known as: ROBAXIN Take 1 tablet (750 mg total) by mouth 2 (two) times daily as needed for muscle spasms.   metoprolol tartrate 100 MG tablet Commonly known as: LOPRESSOR Take 100 mg by mouth 2 (two) times daily.   ondansetron 4 MG tablet Commonly known as: ZOFRAN Take 1-2 tablets (4-8 mg total) by mouth every 8 (eight) hours as needed for nausea or vomiting.   ramipril 10 MG capsule Commonly known as: ALTACE Take 20 mg by mouth daily.     ASK your doctor about these medications   HYDROcodone-acetaminophen 7.5-325 MG tablet Commonly known as: NORCO Take 1 tablet by mouth every 6 (six) hours as needed for up to 5 days for moderate pain. Ask about: Should I take this medication?            Discharge Care Instructions  (From admission, onward)         Start     Ordered   09/25/19 0000  Discharge wound care:       Comments: Continue twice a day daily dressing changes until wound healed she will follow up with orthopedic surgery in 2 weeks.   09/25/19 0913   09/18/19 0000  Leave dressing on - Keep it clean, dry, and intact until clinic visit        09/18/19 1200          Contact information for follow-up providers    Care, [facility] Follow up.   Specialty: Home Health Services Why: [name] will be providing home health physical therapy and occupational therapy for your home health services.  You should receive a call in the next 24-48 hours for services. Contact information: [place] [address] [phone]        Leandrew Koyanagi, MD. Schedule an appointment as soon as possible for a visit in 1 week(s).   Specialty: Orthopedic Surgery Contact information: [place] [address] [phone]            Contact information for after-discharge care    [facility] .   Service: Skilled Nursing Contact information: [place] [address] [phone]                 Allergies  Allergen Reactions  . Actos [Pioglitazone] Swelling    SWELLING REACTION UNSPECIFIED     Consultations:  Orthopedic surgery   Procedures/Studies: DG Chest 2 View  Result Date: 09/14/2019 CLINICAL DATA:  Suspected sepsis.  Shortness of breath. EXAM: CHEST - 2 VIEW COMPARISON:  Remote radiograph 07/19/2005 FINDINGS: Mild cardiomegaly, stable. Unchanged mediastinal contours.  Atherosclerosis of the thoracic aorta. No confluent airspace disease. No pleural fluid or pneumothorax. No pulmonary edema. Surgical hardware in the lower cervical spine. Chronic compression fracture in the lower thoracic spine, as seen on lumbar radiograph 02/17/2019. IMPRESSION: 1. No acute chest findings. 2. Stable mild cardiomegaly. Aortic Atherosclerosis (ICD10-I70.0). Electronically Signed   By: Keith Rake M.D.   On: 09/14/2019 22:47   XR Elbow 2 Views Right  Result Date: 09/11/2019 Stable ORIF of the right distal humerus fracture without complication.  XR Elbow 2 Views Right  Result Date: 09/02/2019 Stable fixation alignment of distal humerus fracture and olecranon osteotomy  [place] EKG SITE RITE  Result Date: 09/17/2019 If Site Rite image not attached, placement could not be confirmed due to current cardiac rhythm.   (Echo, Carotid, EGD, Colonoscopy, ERCP)    Subjective: She relates her pain is controlled no new complaints.  Discharge Exam: Vitals:   09/25/19 0339 09/25/19 0745  BP: (!)  145/73 (!) 148/80  Pulse: 72 74  Resp: 16 14  Temp: 98 F (36.7 C) 98.3 F (36.8 C)  SpO2: 97% 97%   Vitals:   09/24/19 1357 09/24/19 1957 09/25/19 0339 09/25/19 0745  BP: 122/74 (!) 145/82 (!) 145/73 (!) 148/80  Pulse: 76 78 72 74  Resp: 17 16 16 14   Temp: 97.7 F (36.5 C) 98.1 F (36.7 C) 98 F (36.7 C) 98.3 F (36.8 C)  TempSrc: Oral Oral Oral Oral  SpO2: 99% 94% 97% 97%  Weight:      Height:        General: Pt is alert, awake, not in acute distress Cardiovascular: RRR, S1/S2 +, no rubs, no gallops Respiratory: CTA bilaterally, no wheezing, no rhonchi Abdominal: Soft, NT, ND, bowel sounds + Extremities: no edema, no cyanosis    The results of significant diagnostics from this hospitalization (including imaging, microbiology, ancillary and laboratory) are listed below for reference.     Microbiology: Recent Results (from the past 240 hour(s))  Aerobic/Anaerobic Culture (surgical/deep wound)     Status: None   Collection Time: 09/15/19  4:27 PM   Specimen: Other Source; Tissue  Result Value Ref Range Status   Specimen Description WOUND RIGHT ELBOW  Final   Special Requests NONE  Final   Gram Stain   Final    ABUNDANT WBC PRESENT, PREDOMINANTLY PMN NO ORGANISMS SEEN    Culture   Final    RARE PROPIONIBACTERIUM SPECIES Standardized susceptibility testing for this organism is not available. Performed at [facility], [place] [address] [place], [place] [address]    Report Status 09/20/2019 FINAL  Final  SARS CORONAVIRUS 2 (TAT 6-24 HRS) Nasopharyngeal Nasopharyngeal Swab     Status: None   Collection Time: 09/24/19  5:03 PM   Specimen: Nasopharyngeal Swab  Result Value Ref Range Status   SARS Coronavirus 2 NEGATIVE NEGATIVE Final    Comment: (NOTE) SARS-CoV-2 target nucleic acids are NOT DETECTED.  The SARS-CoV-2 RNA is generally detectable in upper and lower respiratory specimens during the acute phase of infection. Negative results do not preclude  SARS-CoV-2 infection, do not rule out co-infections with other pathogens, and should not be used as the sole basis for treatment or other patient management decisions. Negative results must be combined with clinical observations, patient history, and epidemiological information. The expected result is Negative.  Fact Sheet for Patients: SugarRoll.be  Fact Sheet for Healthcare Providers: https://www.woods-mathews.com/  This test is not yet approved or cleared by the [place] FDA and  has been authorized for detection and/or diagnosis of SARS-CoV-2 by FDA under an Emergency Use Authorization (EUA). This EUA will remain  in effect (meaning this test can be used) for the duration of the COVID-19 declaration under Se ction 564(b)(1) of the Act, 21 U.S.C. section 360bbb-3(b)(1), unless the authorization is terminated or revoked sooner.  Performed at [facility], [place] [address] [place], [place] [address]      Labs: BNP (last 3 results) No results for input(s): BNP in the last 8760 hours. Basic Metabolic Panel: No results for input(s): NA, K, CL, CO2, GLUCOSE, BUN, CREATININE, CALCIUM, MG, PHOS in the last 168 hours. Liver Function Tests: No results for input(s): AST, ALT, ALKPHOS, BILITOT, PROT, ALBUMIN in the last 168 hours. No results for input(s): LIPASE, AMYLASE in the last 168 hours. No results for input(s): AMMONIA in the last 168 hours. CBC: Recent Labs  Lab 09/21/19 1306  WBC 10.2  HGB 9.9*  HCT 30.9*  MCV 89.0  PLT 271   Cardiac Enzymes: No results for input(s): CKTOTAL, CKMB, CKMBINDEX, TROPONINI in the last 168 hours. BNP: Invalid input(s): POCBNP CBG: Recent Labs  Lab 09/24/19 1622 09/24/19 1958 09/25/19 0003 09/25/19 0339 09/25/19 0743  GLUCAP 146* 178* 129* 133* 136*   D-Dimer No results for input(s): DDIMER in the last 72 hours. Hgb A1c No results for input(s): HGBA1C in the last 72 hours.  Lipid Profile No results for input(s): CHOL, HDL, LDLCALC, TRIG, CHOLHDL, LDLDIRECT in the last 72 hours. Thyroid function studies No results for input(s): TSH, T4TOTAL, T3FREE, THYROIDAB in the last 72 hours.  Invalid input(s): FREET3 Anemia work up No results for input(s): VITAMINB12, FOLATE, FERRITIN, TIBC, IRON, RETICCTPCT in the last 72 hours. Urinalysis    Component Value Date/Time   COLORURINE YELLOW 09/15/2019 0125   APPEARANCEUR CLEAR 09/15/2019 0125   LABSPEC 1.006 09/15/2019 0125   PHURINE 6.0 09/15/2019 0125   GLUCOSEU 50 (A) 09/15/2019 0125   HGBUR SMALL (A) 09/15/2019 0125   BILIRUBINUR NEGATIVE 09/15/2019 0125   KETONESUR NEGATIVE 09/15/2019 0125   PROTEINUR NEGATIVE 09/15/2019 0125   NITRITE NEGATIVE 09/15/2019 0125   LEUKOCYTESUR NEGATIVE 09/15/2019 0125   Sepsis Labs Invalid input(s): PROCALCITONIN,  WBC,  LACTICIDVEN Microbiology Recent Results (from the past 240 hour(s))  Aerobic/Anaerobic Culture (surgical/deep wound)     Status: None   Collection Time: 09/15/19  4:27 PM   Specimen: Other Source; Tissue  Result Value Ref Range Status   Specimen Description WOUND RIGHT ELBOW  Final   Special Requests NONE  Final   Gram Stain   Final    ABUNDANT WBC PRESENT, PREDOMINANTLY PMN NO ORGANISMS SEEN    Culture   Final    RARE PROPIONIBACTERIUM SPECIES Standardized susceptibility testing for this organism is not available. Performed at [facility], [place] [address] [place], [place] [address]    Report Status 09/20/2019 FINAL  Final  SARS CORONAVIRUS 2 (TAT 6-24 HRS) Nasopharyngeal Nasopharyngeal Swab     Status: None   Collection Time: 09/24/19  5:03 PM   Specimen: Nasopharyngeal Swab  Result Value Ref Range Status   SARS Coronavirus 2 NEGATIVE NEGATIVE Final    Comment: (NOTE) SARS-CoV-2 target nucleic acids are NOT DETECTED.  The SARS-CoV-2 RNA is generally detectable in upper and lower respiratory specimens during the acute phase of infection.  Negative results do not preclude SARS-CoV-2 infection, do not rule out co-infections with other pathogens, and should not be used as the sole basis for  treatment or other patient management decisions. Negative results must be combined with clinical observations, patient history, and epidemiological information. The expected result is Negative.  Fact Sheet for Patients: SugarRoll.be  Fact Sheet for Healthcare Providers: https://www.woods-mathews.com/  This test is not yet approved or cleared by the [place] FDA and  has been authorized for detection and/or diagnosis of SARS-CoV-2 by FDA under an Emergency Use Authorization (EUA). This EUA will remain  in effect (meaning this test can be used) for the duration of the COVID-19 declaration under Se ction 564(b)(1) of the Act, 21 U.S.C. section 360bbb-3(b)(1), unless the authorization is terminated or revoked sooner.  Performed at [facility], [place] [address], [place], [place] [address]      Time coordinating discharge: Over 40 minutes  SIGNED:   Charlynne Cousins, MD  [facility] 09/25/2019, 9:13 AM Pager   If 7PM-7AM, please contact night-coverage [email or web address] Password [email or web address]

## 2019-09-25 NOTE — Care Management Important Message (Signed)
Important Message  Patient Details  Name: Kayla Griffin MRN: [id number] Date of Birth: Jun 30, 1940   Medicare Important Message Given:  Yes     Orbie Pyo 09/25/2019, 2:49 PM

## 2019-09-25 NOTE — Progress Notes (Signed)
Physical Therapy Treatment Patient Details Name: Kayla Griffin MRN: [id number] DOB: 12-01-1940 Today's Date: 09/25/2019    History of Present Illness Patient is a 79 y/o female who presents with RUE/elbow abscess s/p I&D 6/7. PMH includes DM, HTN, glaucoma, endometrial carcinoma status post hysterectomy, supracondylar right humerus fracture s/p ORIF on 08/25/2019.   PT Comments    Pt progressing well with mobility. Today's session focused on sit<>stand transfer training for LE strengthening and improved functional mobility. Pt initially required modA to stand to RW from EOB, progressing to minA. C/o dizziness upon standing with BP 153/95, HR 76, SpO2 97% on RA. Pt remains limited by generalized weakness, decreased activity tolerance and RUE NWB precautions. Continue to recommend SNF-level therapies to maximize functional mobility and independence.    Follow Up Recommendations  SNF;Supervision for mobility/OOB     Equipment Recommendations   (defer)    Recommendations for Other Services       Precautions / Restrictions Precautions Precautions: Fall Restrictions Weight Bearing Restrictions: Yes RUE Weight Bearing: Non weight bearing Other Position/Activity Restrictions: Ok for light weightbearing through RUE for walker use only    Mobility  Bed Mobility Overal bed mobility: Needs Assistance Bed Mobility: Supine to Sit     Supine to sit: Min guard;HOB elevated     General bed mobility comments: Motivated to perform as independently as possible, heavy reliance on rail support to scoot hips to EOB  Transfers Overall transfer level: Needs assistance Equipment used: Rolling walker (2 wheeled) Transfers: Sit to/from Stand Sit to Stand: Mod assist;Min assist         General transfer comment: Reliant on momentum and modA to power into stand from EOB, cues for improved eccentric control into sitting; practiced multiple sit<>stands from [facility] and recliner, fluctuating between  min-modA for trunk elevation, heavy reliance on LUE support to push into standing from armrests  Ambulation/Gait Ambulation/Gait assistance: Min guard Gait Distance (Feet): 5 Feet Assistive device: Rolling walker (2 wheeled) Gait Pattern/deviations: Wide base of support;Trunk flexed;Step-through pattern;Decreased stride length Gait velocity: decreased   General Gait Details: Steps from bed to recliner to [facility] to recliner; pt quick to fatigue requiring seated rest. Close min guard for safety and balance, cues to maintain upright posture, no overt LOB   Stairs             Wheelchair Mobility    Modified Rankin (Stroke Patients Only)       Balance Overall balance assessment: Needs assistance Sitting-balance support: Feet supported;No upper extremity supported Sitting balance-Leahy Scale: Fair     Standing balance support: Bilateral upper extremity supported;During functional activity Standing balance-Leahy Scale: Poor Standing balance comment: Requires UE support in  standing                            Cognition Arousal/Alertness: Awake/alert Behavior During Therapy: WFL for tasks assessed/performed Overall Cognitive Status: Within Functional Limits for tasks assessed                                        Exercises General Exercises - Upper Extremity Wrist Flexion: AROM;Right Wrist Extension: AROM;Right Digit Composite Flexion: AROM;Right Composite Extension: AROM;Right General Exercises - Lower Extremity Long Arc Quad: AROM;Both;Seated Hip Flexion/Marching: AROM;Both;Seated    General Comments        Pertinent Vitals/Pain Pain Assessment: 0-10 Pain Score: 0-No pain Pain  Location: RUE Pain Descriptors / Indicators: Guarding;Sore Pain Intervention(s): Monitored during session;Limited activity within patient's tolerance    Home Living                      Prior Function            PT Goals (current goals can now  be found in the care plan section) Progress towards PT goals: Progressing toward goals    Frequency    Min 3X/week      PT Plan Current plan remains appropriate    Co-evaluation              AM-PAC PT "6 Clicks" Mobility   Outcome Measure  Help needed turning from your back to your side while in a flat bed without using bedrails?: None Help needed moving from lying on your back to sitting on the side of a flat bed without using bedrails?: A Little Help needed moving to and from a bed to a chair (including a wheelchair)?: A Little Help needed standing up from a chair using your arms (e.g., wheelchair or bedside chair)?: A Lot Help needed to walk in hospital room?: A Little Help needed climbing 3-5 steps with a railing? : A Lot 6 Click Score: 17    End of Session   Activity Tolerance: Patient tolerated treatment well Patient left: in chair;with call bell/phone within reach;with chair alarm set Nurse Communication: Mobility status PT Visit Diagnosis: Muscle weakness (generalized) (M62.81);Difficulty in walking, not elsewhere classified (R26.2);Unsteadiness on feet (R26.81)     Time: 4818-5631 PT Time Calculation (min) (ACUTE ONLY): 29 min  Charges:  $Therapeutic Exercise: 8-22 mins $Therapeutic Activity: 8-22 mins                     Mabeline Caras, PT, DPT Acute Rehabilitation Services  Pager [phone] Office [place] 09/25/2019, 12:16 PM

## 2019-09-26 NOTE — Telephone Encounter (Signed)
Tried calling, was advised she was in a meeting. LMVM with details advising per Dr Erlinda Hong.

## 2019-09-26 NOTE — Telephone Encounter (Signed)
Can you please advise?

## 2019-09-26 NOTE — Telephone Encounter (Signed)
Sure - that's fine

## 2019-10-01 DIAGNOSIS — I10 Essential (primary) hypertension: Secondary | ICD-10-CM | POA: Diagnosis not present

## 2019-10-01 DIAGNOSIS — E1165 Type 2 diabetes mellitus with hyperglycemia: Secondary | ICD-10-CM | POA: Diagnosis not present

## 2019-10-01 DIAGNOSIS — L02413 Cutaneous abscess of right upper limb: Secondary | ICD-10-CM | POA: Diagnosis not present

## 2019-10-01 DIAGNOSIS — S42309A Unspecified fracture of shaft of humerus, unspecified arm, initial encounter for closed fracture: Secondary | ICD-10-CM | POA: Diagnosis not present

## 2019-10-02 ENCOUNTER — Other Ambulatory Visit: Payer: Self-pay

## 2019-10-02 ENCOUNTER — Encounter: Payer: Self-pay | Admitting: Family

## 2019-10-02 ENCOUNTER — Ambulatory Visit (INDEPENDENT_AMBULATORY_CARE_PROVIDER_SITE_OTHER): Payer: Medicare HMO | Admitting: Family

## 2019-10-02 VITALS — BP 124/79 | HR 85 | Temp 97.4°F | Wt 183.0 lb

## 2019-10-02 DIAGNOSIS — T8142XD Infection following a procedure, deep incisional surgical site, subsequent encounter: Secondary | ICD-10-CM

## 2019-10-02 NOTE — Progress Notes (Signed)
Subjective:    Patient ID: Kayla Griffin, female    DOB: Dec 25, 1940, 79 y.o.   MRN: [id number]  Chief Complaint  Patient presents with  . Hospitalization Follow-up    R arm cellulitis     HPI:  Kayla Griffin is a 79 y.o. female with previous medical history of type 2 diabetes, hypertension, and hyperlipidemia who recently suffered a humeral fracture requiring open reduction internal fixation on 08/25/2019 and recently be admitted to [facility] with surgical site redness and swelling and drainage.  She underwent incision and drainage of the wound with cultures being without growth.  Initially recommended for IV antibiotics, however due to her social situation she was placed on doxycycline and cephalexin with 1 dose of dalbavancin prior to discharge.  Kayla Griffin has been receiving her doxycycline and cephalexin as prescribed with no adverse side effects or missed doses since leaving the hospital.  She is currently in skilled rehabilitation.  Wound dressing is changed on a daily basis.  She does have 1 small area of drainage with the surgical incision healing well.  No fevers, chills, or sweats.   Allergies  Allergen Reactions  . Actos [Pioglitazone] Swelling    SWELLING REACTION UNSPECIFIED       Outpatient Medications Prior to Visit  Medication Sig Dispense Refill  . alendronate (FOSAMAX) 70 MG tablet Take 70 mg by mouth every Sunday. Take with a full glass of water on an empty stomach.    Marland Kitchen aspirin EC 325 MG tablet Take 1 tablet (325 mg total) by mouth daily. 84 tablet 0  . atorvastatin (LIPITOR) 40 MG tablet Take 40 mg by mouth daily.     . Calcium Carbonate-Vit D-Min (CALCIUM 1200 PO) Take 1,200 mg by mouth daily.    . cephALEXin (KEFLEX) 500 MG capsule Take 1 capsule (500 mg total) by mouth 4 (four) times daily. 152 capsule 0  . dorzolamide (TRUSOPT) 2 % ophthalmic solution Place 1 drop into both eyes 2 (two) times daily.     Marland Kitchen doxycycline (ADOXA) 100 MG tablet Take  1 tablet (100 mg total) by mouth 2 (two) times daily. 76 tablet 0  . hydrochlorothiazide (HYDRODIURIL) 25 MG tablet Take 25 mg by mouth daily.     . insulin NPH-regular Human (NOVOLIN 70/30) (70-30) 100 UNIT/ML injection Inject 30 Units into the skin 2 (two) times daily with a meal.    . latanoprost (XALATAN) 0.005 % ophthalmic solution Place 1 drop into both eyes at bedtime.     . metFORMIN (GLUCOPHAGE) 500 MG tablet Take 500 mg by mouth 2 (two) times daily with a meal.    . methocarbamol (ROBAXIN) 750 MG tablet Take 1 tablet (750 mg total) by mouth 2 (two) times daily as needed for muscle spasms. 20 tablet 3  . metoprolol (LOPRESSOR) 100 MG tablet Take 100 mg by mouth 2 (two) times daily.    . ondansetron (ZOFRAN) 4 MG tablet Take 1-2 tablets (4-8 mg total) by mouth every 8 (eight) hours as needed for nausea or vomiting. 20 tablet 0  . ramipril (ALTACE) 10 MG capsule Take 20 mg by mouth daily.      No facility-administered medications prior to visit.     Past Medical History:  Diagnosis Date  . Ambulates with cane   . Arthritis   . Cancer [facility])    Endometrial - hysterectomy  . Depression   . Diabetes mellitus without complication ([place])    Type II  . Dyspnea  currently with arm pain 08/22/19  . Glaucoma   . HLD (hyperlipidemia)   . Hypertension   . Wears dentures       Past Surgical History:  Procedure Laterality Date  . ABDOMINAL HYSTERECTOMY    . CERVICAL FUSION  2007  . EYE SURGERY Bilateral    cataracts  . I & D EXTREMITY Right 09/15/2019   Procedure: IRRIGATION AND DEBRIDEMENT ELBOW;  Surgeon: Leandrew Koyanagi, MD;  Location: [place];  Service: Orthopedics;  Laterality: Right;  . ORIF HUMERUS FRACTURE Right 08/25/2019   Procedure: OPEN REDUCTION INTERNAL FIXATION (ORIF) RIGHT SUPRACONDYLAR HUMERUS FRACTURE, NEUROLYSIS ULNAR NERVE;  Surgeon: Leandrew Koyanagi, MD;  Location: [place];  Service: Orthopedics;  Laterality: Right;  . ORIF PATELLA Right 07/07/2016   Procedure: Partial  Patellectomy;  Surgeon: Leandrew Koyanagi, MD;  Location: [place];  Service: Orthopedics;  Laterality: Right;      Family History  Problem Relation Age of Onset  . CVA Mother   . Heart disease Father 89  . Heart attack Sister   . Heart disease Sister   . Arrhythmia Sister   . CVA Sister       Social History   Socioeconomic History  . Marital status: Single    Spouse name: Not on file  . Number of children: Not on file  . Years of education: Not on file  . Highest education level: Not on file  Occupational History  . Not on file  Tobacco Use  . Smoking status: Never Smoker  . Smokeless tobacco: Never Used  Vaping Use  . Vaping Use: Never used  Substance and Sexual Activity  . Alcohol use: No  . Drug use: No  . Sexual activity: Not on file    Comment: Hysterectomy  Other Topics Concern  . Not on file  Social History Narrative  . Not on file   Social Determinants of Health   Financial Resource Strain:   . Difficulty of Paying Living Expenses:   Food Insecurity:   . Worried About Charity fundraiser in the Last Year:   . Arboriculturist in the Last Year:   Transportation Needs:   . Film/video editor (Medical):   Marland Kitchen Lack of Transportation (Non-Medical):   Physical Activity:   . Days of Exercise per Week:   . Minutes of Exercise per Session:   Stress:   . Feeling of Stress :   Social Connections:   . Frequency of Communication with Friends and Family:   . Frequency of Social Gatherings with Friends and Family:   . Attends Religious Services:   . Active Member of Clubs or Organizations:   . Attends Archivist Meetings:   Marland Kitchen Marital Status:   Intimate Partner Violence:   . Fear of Current or Ex-Partner:   . Emotionally Abused:   Marland Kitchen Physically Abused:   . Sexually Abused:       Review of Systems  Constitutional: Negative for chills, diaphoresis, fatigue and fever.  Respiratory: Negative for cough, chest tightness, shortness of breath and  wheezing.   Cardiovascular: Negative for chest pain.  Gastrointestinal: Negative for abdominal pain, diarrhea, nausea and vomiting.       Objective:    BP 124/79   Pulse 85   Temp (!) 97.4 F (36.3 C) (Oral)   Wt 183 lb (83 kg)   LMP  (LMP Unknown)   BMI 31.41 kg/m  Nursing note and vital signs reviewed.  Physical  Exam Constitutional:      General: She is not in acute distress.    Appearance: She is well-developed. She is obese.     Comments: Seated in the wheelchair; pleasant  Cardiovascular:     Rate and Rhythm: Normal rate and regular rhythm.     Heart sounds: Normal heart sounds.  Pulmonary:     Effort: Pulmonary effort is normal.     Breath sounds: Normal breath sounds.  Musculoskeletal:     Comments: Left elbow with wound dressing in place.  There is mild amount of drainage located on the dressing.  See picture below.  There is some serous drainage from the wound.  Remaining surgical site is well approximated with sutures intact.  No evidence of infection along the suture line.  Skin:    General: Skin is warm and dry.  Neurological:     Mental Status: She is alert and oriented to person, place, and time.  Psychiatric:        Behavior: Behavior normal.        Thought Content: Thought content normal.        Judgment: Judgment normal.                    Assessment & Plan:   Patient Active Problem List   Diagnosis Date Noted  . Postoperative infection   . Abscess of right upper arm and forearm 09/15/2019  . Cellulitis of right upper extremity 09/14/2019  . Insulin-requiring or dependent type II diabetes mellitus ([place]) 09/14/2019  . Hyponatremia   . Right supracondylar humerus fracture, closed, initial encounter 08/20/2019  . Abnormal EKG 08/10/2016  . Hypertension 08/10/2016     Problem List Items Addressed This Visit      Other   Postoperative infection - Primary    Kayla Griffin is a 79 year old female with postoperative wound infection status  post incision and drainage with cultures being negative and on oral antibiotics with cephalexin and doxycycline.  Previous inflammatory markers significantly elevated.  Will recheck today.  I do have concern regarding her wound healing with a small area.  Recommend follow-up with Dr. Erlinda Hong.  Continue basic wound care.  We will continue current dose of doxycycline and cephalexin for the next 3 weeks.      Relevant Orders   C-reactive protein   Sedimentation rate       I am having Midge Minium. Hassinger maintain her atorvastatin, ramipril, latanoprost, dorzolamide, metoprolol tartrate, metFORMIN, insulin NPH-regular Human, hydrochlorothiazide, aspirin EC, Calcium Carbonate-Vit D-Min (CALCIUM 1200 PO), alendronate, methocarbamol, ondansetron, doxycycline, and cephALEXin.   Follow-up: Return in about 3 weeks (around 10/23/2019), or if symptoms worsen or fail to improve.    Terri Piedra, MSN, FNP-C Nurse Practitioner [facility] for Infectious Disease [place] number: [phone]

## 2019-10-02 NOTE — Patient Instructions (Addendum)
Nice to see you.   We will check your lab work today.  Continue to take your Cephalexin and doxycycline through 7/16.   Continue current wound care.    Recommend follow up with Dr. Erlinda Hong.   Plan for follow up in 3 weeks or sooner if needed.

## 2019-10-02 NOTE — Assessment & Plan Note (Signed)
Kayla Griffin is a 79 year old female with postoperative wound infection status post incision and drainage with cultures being negative and on oral antibiotics with cephalexin and doxycycline.  Previous inflammatory markers significantly elevated.  Will recheck today.  I do have concern regarding her wound healing with a small area.  Recommend follow-up with Dr. Erlinda Hong.  Continue basic wound care.  We will continue current dose of doxycycline and cephalexin for the next 3 weeks.

## 2019-10-03 ENCOUNTER — Telehealth: Payer: Self-pay | Admitting: Orthopaedic Surgery

## 2019-10-03 LAB — SEDIMENTATION RATE: Sed Rate: 19 mm/h (ref 0–30)

## 2019-10-03 LAB — C-REACTIVE PROTEIN: CRP: 28 mg/L — ABNORMAL HIGH (ref <8.0)

## 2019-10-03 NOTE — Telephone Encounter (Signed)
Non weight bearing

## 2019-10-03 NOTE — Telephone Encounter (Signed)
Pls advise.  

## 2019-10-03 NOTE — Telephone Encounter (Signed)
Caryl Pina from [facility] called   They are requesting the patient's discharge instructions be faxed to them. Said they don't know her weight baring status or anything.   Fax number: [phone] Call back: [phone]

## 2019-10-06 NOTE — Telephone Encounter (Signed)
IC advised.  

## 2019-10-09 ENCOUNTER — Telehealth: Payer: Self-pay | Admitting: Orthopaedic Surgery

## 2019-10-09 NOTE — Telephone Encounter (Signed)
Kayla Griffin from [facility] called requesting Dr. Domingo Madeira notes. Please include recommendations for right arm ORIF weight baring, and what he would like for the facility to work on with patient. Please fax notes to [phone]. Y[place] phone number is [phone].

## 2019-10-09 NOTE — Telephone Encounter (Signed)
Please advise. Patient has not seen [place] since [place].

## 2019-10-10 NOTE — Telephone Encounter (Signed)
Still NWB.  ROM as tolerated

## 2019-10-10 NOTE — Telephone Encounter (Signed)
Called to advise.  

## 2019-10-15 DIAGNOSIS — R69 Illness, unspecified: Secondary | ICD-10-CM | POA: Diagnosis not present

## 2019-10-15 DIAGNOSIS — R45851 Suicidal ideations: Secondary | ICD-10-CM | POA: Diagnosis not present

## 2019-10-23 ENCOUNTER — Telehealth: Payer: Self-pay

## 2019-10-23 NOTE — Telephone Encounter (Signed)
Patient called to report that since leaving rehab she has been having increased dizziness and nausea. Patient is taking metformin for diabetes and found out through family friend that the antibiotics she's on (cephalexin and doxycycline) interact with it. Reports that she stopped taking cephalexin yesterday due to the severity of the dizziness and nausea but states that she's finally been able to eat without issue. Dizziness has resolved as well. Patient would like to know if the medication can be changed or if she can just continue to take the doxy alone. Patient scheduled for follow up next week with Greg (7/20)  Carlean Purl, RN

## 2019-10-23 NOTE — Telephone Encounter (Signed)
There is a moderate drug interaction between cephalexin and metformin - it can cause increased concentrations of metformin so it would explain the nausea, maybe the dizziness. The recommendation is to "monitor therapy". Just FYI.

## 2019-10-27 NOTE — Telephone Encounter (Signed)
Noted and will make changes tomorrow if needed - could switch to Augmentin if needed.

## 2019-10-28 ENCOUNTER — Telehealth: Payer: Self-pay | Admitting: Orthopaedic Surgery

## 2019-10-28 ENCOUNTER — Ambulatory Visit: Payer: Medicare HMO | Admitting: Family

## 2019-10-28 DIAGNOSIS — L02413 Cutaneous abscess of right upper limb: Secondary | ICD-10-CM | POA: Diagnosis not present

## 2019-10-28 DIAGNOSIS — E11319 Type 2 diabetes mellitus with unspecified diabetic retinopathy without macular edema: Secondary | ICD-10-CM | POA: Diagnosis not present

## 2019-10-28 DIAGNOSIS — Z9119 Patient's noncompliance with other medical treatment and regimen: Secondary | ICD-10-CM | POA: Diagnosis not present

## 2019-10-28 DIAGNOSIS — T8142XD Infection following a procedure, deep incisional surgical site, subsequent encounter: Secondary | ICD-10-CM | POA: Diagnosis not present

## 2019-10-28 MED ORDER — AMOXICILLIN-POT CLAVULANATE 875-125 MG PO TABS: 1.0000 | ORAL_TABLET | Freq: Two times a day (BID) | ORAL | 2 refills | Status: DC

## 2019-10-28 NOTE — Telephone Encounter (Signed)
Patient's PCP office called requesting Dr. Erlinda Hong call Dr. Milagros Evener from [facility] about patient's appt tomorrow. Dr. Radene Ou office asked for a return call from Dr. Erlinda Hong. Office number for Dr. Zadie Rhine is [phone]. Asked to send message as important.

## 2019-10-28 NOTE — Telephone Encounter (Signed)
Dr. Erlinda Hong is out of the office. Can you call the Dr. Marina Gravel.

## 2019-10-28 NOTE — Telephone Encounter (Signed)
I spoke to Dr. Zadie Rhine.  Sounds like this patient left [facility] from [facility] and has ? Been doing her own wound care. [facility] would not approve vanc so was on keflex and doxy and has stopped taking keflex.  Sees [place] tomorrow so just giving you a heads up

## 2019-10-28 NOTE — Addendum Note (Signed)
Addended by: Mauricio Po D on: 10/28/2019 05:45 PM   Modules accepted: Orders

## 2019-10-28 NOTE — Telephone Encounter (Signed)
Patient ended up rescheduling appointment due to transportation issues. Would you like to switch her to Augmentin now? Thanks!  Madeleyn Schwimmer Lorita Officer, RN

## 2019-10-29 ENCOUNTER — Encounter: Payer: Self-pay | Admitting: Orthopaedic Surgery

## 2019-10-29 ENCOUNTER — Ambulatory Visit (INDEPENDENT_AMBULATORY_CARE_PROVIDER_SITE_OTHER): Payer: Medicare HMO

## 2019-10-29 ENCOUNTER — Ambulatory Visit (INDEPENDENT_AMBULATORY_CARE_PROVIDER_SITE_OTHER): Payer: Medicare HMO | Admitting: Orthopaedic Surgery

## 2019-10-29 VITALS — Ht 64.0 in | Wt 183.0 lb

## 2019-10-29 DIAGNOSIS — L02413 Cutaneous abscess of right upper limb: Secondary | ICD-10-CM | POA: Diagnosis not present

## 2019-10-29 DIAGNOSIS — S42411A Displaced simple supracondylar fracture without intercondylar fracture of right humerus, initial encounter for closed fracture: Secondary | ICD-10-CM | POA: Diagnosis not present

## 2019-10-29 MED ORDER — FLUCONAZOLE 150 MG PO TABS
150.0000 mg | ORAL_TABLET | Freq: Once | ORAL | 6 refills | Status: AC
Start: 2019-10-29 — End: 2019-10-29

## 2019-10-29 NOTE — Progress Notes (Signed)
Post-Op Visit Note   Patient: Kayla Griffin           Date of Birth: 04-27-1940           MRN: [id number] Visit Date: 10/29/2019 PCP: Aretta Nip, MD   Assessment & Plan:  Chief Complaint:  Chief Complaint  Patient presents with  . Right Elbow - Routine Post Op    09/15/2019 I&D Right Elbow   Visit Diagnoses:  1. Right supracondylar humerus fracture, closed, initial encounter   2. Abscess of right upper arm and forearm     Plan: Danyeal is 9 weeks status post I&D of a right elbow abscess and deep infection status post ORIF right supracondylar humerus fracture.  She is doing well overall.  She is here for suture removal.  Overall her redness and swelling have significantly improved.  No signs of infection.  Range of motion of the elbow is improving well.  No pain with range of motion.  Sutures removed today.  Ace wrap applied.  We will begin outpatient OT at this point.  Recheck in 6 weeks with two-view x-rays of the right elbow.  Follow-Up Instructions: Return in about 6 weeks (around 12/10/2019).   Orders:  Orders Placed This Encounter  Procedures  . XR Elbow 2 Views Right   Meds ordered this encounter  Medications  . fluconazole (DIFLUCAN) 150 MG tablet    Sig: Take 1 tablet (150 mg total) by mouth once for 1 dose.    Dispense:  1 tablet    Refill:  6    Imaging: XR Elbow 2 Views Right  Result Date: 10/29/2019 Abundant callus formation around the supracondylar humerus fracture.  There is interval gapping of the olecranon osteotomy site.   PMFS History: Patient Active Problem List   Diagnosis Date Noted  . Postoperative infection   . Abscess of right upper arm and forearm 09/15/2019  . Cellulitis of right upper extremity 09/14/2019  . Insulin-requiring or dependent type II diabetes mellitus ([place]) 09/14/2019  . Hyponatremia   . Right supracondylar humerus fracture, closed, initial encounter 08/20/2019  . Abnormal EKG 08/10/2016  . Hypertension  08/10/2016   Past Medical History:  Diagnosis Date  . Ambulates with cane   . Arthritis   . Cancer [facility])    Endometrial - hysterectomy  . Depression   . Diabetes mellitus without complication ([place])    Type II  . Dyspnea    currently with arm pain 08/22/19  . Glaucoma   . HLD (hyperlipidemia)   . Hypertension   . Wears dentures     Family History  Problem Relation Age of Onset  . CVA Mother   . Heart disease Father 76  . Heart attack Sister   . Heart disease Sister   . Arrhythmia Sister   . CVA Sister     Past Surgical History:  Procedure Laterality Date  . ABDOMINAL HYSTERECTOMY    . CERVICAL FUSION  2007  . EYE SURGERY Bilateral    cataracts  . I & D EXTREMITY Right 09/15/2019   Procedure: IRRIGATION AND DEBRIDEMENT ELBOW;  Surgeon: Leandrew Koyanagi, MD;  Location: [place];  Service: Orthopedics;  Laterality: Right;  . ORIF HUMERUS FRACTURE Right 08/25/2019   Procedure: OPEN REDUCTION INTERNAL FIXATION (ORIF) RIGHT SUPRACONDYLAR HUMERUS FRACTURE, NEUROLYSIS ULNAR NERVE;  Surgeon: Leandrew Koyanagi, MD;  Location: [place];  Service: Orthopedics;  Laterality: Right;  . ORIF PATELLA Right 07/07/2016   Procedure: Partial Patellectomy;  Surgeon:  Leandrew Koyanagi, MD;  Location: [place];  Service: Orthopedics;  Laterality: Right;   Social History   Occupational History  . Not on file  Tobacco Use  . Smoking status: Never Smoker  . Smokeless tobacco: Never Used  Vaping Use  . Vaping Use: Never used  Substance and Sexual Activity  . Alcohol use: No  . Drug use: No  . Sexual activity: Not on file    Comment: Hysterectomy

## 2019-11-03 ENCOUNTER — Other Ambulatory Visit: Payer: Self-pay | Admitting: Radiology

## 2019-11-03 DIAGNOSIS — S42411A Displaced simple supracondylar fracture without intercondylar fracture of right humerus, initial encounter for closed fracture: Secondary | ICD-10-CM

## 2019-11-03 DIAGNOSIS — L02413 Cutaneous abscess of right upper limb: Secondary | ICD-10-CM

## 2019-11-06 ENCOUNTER — Ambulatory Visit: Payer: Medicare HMO | Admitting: Physical Therapy

## 2019-11-20 ENCOUNTER — Ambulatory Visit: Payer: Medicare HMO | Admitting: Family

## 2019-11-20 ENCOUNTER — Encounter: Payer: Self-pay | Admitting: Family

## 2019-11-20 ENCOUNTER — Other Ambulatory Visit: Payer: Self-pay

## 2019-11-20 VITALS — BP 128/81 | HR 112 | Temp 98.1°F | Wt 186.0 lb

## 2019-11-20 DIAGNOSIS — T8142XD Infection following a procedure, deep incisional surgical site, subsequent encounter: Secondary | ICD-10-CM | POA: Diagnosis not present

## 2019-11-20 DIAGNOSIS — T8142XA Infection following a procedure, deep incisional surgical site, initial encounter: Secondary | ICD-10-CM

## 2019-11-20 DIAGNOSIS — T8140XA Infection following a procedure, unspecified, initial encounter: Secondary | ICD-10-CM | POA: Diagnosis not present

## 2019-11-20 NOTE — Patient Instructions (Addendum)
Nice to see you.  Continue to take your Amoxicillin-clavulanate and doxycycline as prescribed until completed.   We will check blood work today.  Follow up with Dr. Erlinda Hong as scheduled.   Plan for follow up pending blood work results if needed.  Have a great day and stay safe!

## 2019-11-20 NOTE — Assessment & Plan Note (Signed)
Kayla Griffin is a 79 year old female with postoperative infection from a supracondylar humerus fracture repaired with open reduction internal fixation status post debridement from abscess with cultures showing no growth or organisms. She continues with Augmentin and doxycycline without problem. Check inflammatory markers today. Consider discontinuation of Augmentin and doxycycline pending blood work results. Continue to work with orthopedics and occupational therapy. Follow-up pending blood work if needed.

## 2019-11-20 NOTE — Progress Notes (Signed)
Subjective:    Patient ID: [name], female    DOB: Dec 03, 1940, 79 y.o.   MRN: [id number]  Chief Complaint  Patient presents with  . Follow-up     HPI:  BONNA STEURY is a 79 y.o. female with post-operative infection following open reduction and internal fixation of a humeral fracture and treated with 1 dose of oritivancin followed with doxycycline and cpehalxin who was last seen in the office on 6/24 for hospital follow up with an area of the wound concerning for continued infection. She was continued on cephalexin and doxycycline. Seen by Dr. Erlinda Hong on 7/21 with sutures removed and no evidence of infection. Here today for routine follow up.   Ms. Larranaga continues to take her doxycycline and Augmentin daily as prescribed with no adverse side effects or missed doses since her last office visit. Overall feeling well today and notes continued wound healing/improvements. No systemic symptoms of fevers, chills, or sweats. She is working with occupational therapy. Describes the right elbow as firm compared to her distal extremity. She is at home now and using the walker for ambulation.  Allergies  Allergen Reactions  . Actos [Pioglitazone] Swelling    SWELLING REACTION UNSPECIFIED       Outpatient Medications Prior to Visit  Medication Sig Dispense Refill  . alendronate (FOSAMAX) 70 MG tablet Take 70 mg by mouth every Sunday. Take with a full glass of water on an empty stomach.    Marland Kitchen amoxicillin-clavulanate (AUGMENTIN) 875-125 MG tablet Take 1 tablet by mouth 2 (two) times daily. 60 tablet 2  . aspirin EC 325 MG tablet Take 1 tablet (325 mg total) by mouth daily. 84 tablet 0  . atorvastatin (LIPITOR) 40 MG tablet Take 40 mg by mouth daily.     . Calcium Carbonate-Vit D-Min (CALCIUM 1200 PO) Take 1,200 mg by mouth daily.    . dorzolamide (TRUSOPT) 2 % ophthalmic solution Place 1 drop into both eyes 2 (two) times daily.     . hydrochlorothiazide (HYDRODIURIL) 25 MG tablet Take 25 mg  by mouth daily.     . insulin NPH-regular Human (NOVOLIN 70/30) (70-30) 100 UNIT/ML injection Inject 30 Units into the skin 2 (two) times daily with a meal.    . latanoprost (XALATAN) 0.005 % ophthalmic solution Place 1 drop into both eyes at bedtime.     . metFORMIN (GLUCOPHAGE) 500 MG tablet Take 500 mg by mouth 2 (two) times daily with a meal.    . methocarbamol (ROBAXIN) 750 MG tablet Take 1 tablet (750 mg total) by mouth 2 (two) times daily as needed for muscle spasms. 20 tablet 3  . metoprolol (LOPRESSOR) 100 MG tablet Take 100 mg by mouth 2 (two) times daily.    . ondansetron (ZOFRAN) 4 MG tablet Take 1-2 tablets (4-8 mg total) by mouth every 8 (eight) hours as needed for nausea or vomiting. 20 tablet 0  . ramipril (ALTACE) 10 MG capsule Take 20 mg by mouth daily.      No facility-administered medications prior to visit.     Past Medical History:  Diagnosis Date  . Ambulates with cane   . Arthritis   . Cancer [facility])    Endometrial - hysterectomy  . Depression   . Diabetes mellitus without complication ([place])    Type II  . Dyspnea    currently with arm pain 08/22/19  . Glaucoma   . HLD (hyperlipidemia)   . Hypertension   . Wears dentures  Past Surgical History:  Procedure Laterality Date  . ABDOMINAL HYSTERECTOMY    . CERVICAL FUSION  2007  . EYE SURGERY Bilateral    cataracts  . I & D EXTREMITY Right 09/15/2019   Procedure: IRRIGATION AND DEBRIDEMENT ELBOW;  Surgeon: Leandrew Koyanagi, MD;  Location: [place];  Service: Orthopedics;  Laterality: Right;  . ORIF HUMERUS FRACTURE Right 08/25/2019   Procedure: OPEN REDUCTION INTERNAL FIXATION (ORIF) RIGHT SUPRACONDYLAR HUMERUS FRACTURE, NEUROLYSIS ULNAR NERVE;  Surgeon: Leandrew Koyanagi, MD;  Location: [place];  Service: Orthopedics;  Laterality: Right;  . ORIF PATELLA Right 07/07/2016   Procedure: Partial Patellectomy;  Surgeon: Leandrew Koyanagi, MD;  Location: [place];  Service: Orthopedics;  Laterality: Right;       Review of Systems   Constitutional: Negative for chills, diaphoresis, fatigue and fever.  Respiratory: Negative for cough, chest tightness, shortness of breath and wheezing.   Cardiovascular: Negative for chest pain.  Gastrointestinal: Negative for abdominal pain, diarrhea, nausea and vomiting.      Objective:    BP 128/81   Pulse (!) 112   Temp 98.1 F (36.7 C) (Oral)   Wt 186 lb (84.4 kg)   LMP  (LMP Unknown)   BMI 31.93 kg/m  Nursing note and vital signs reviewed.  Physical Exam Constitutional:      General: She is not in acute distress.    Appearance: She is well-developed.  Cardiovascular:     Rate and Rhythm: Normal rate and regular rhythm.     Heart sounds: Normal heart sounds.  Pulmonary:     Effort: Pulmonary effort is normal.     Breath sounds: Normal breath sounds.  Musculoskeletal:     Comments: Left elbow wound appears significantly improved with scab formation. No tenderness or induration present around wound. No drainage noted. Distal pulses and sensation intact and appropriate.  Skin:    General: Skin is warm and dry.  Neurological:     Mental Status: She is alert and oriented to person, place, and time.  Psychiatric:        Behavior: Behavior normal.        Thought Content: Thought content normal.        Judgment: Judgment normal.     No flowsheet data found.     Assessment & Plan:    Patient Active Problem List   Diagnosis Date Noted  . Postoperative infection   . Abscess of right upper arm and forearm 09/15/2019  . Cellulitis of right upper extremity 09/14/2019  . Insulin-requiring or dependent type II diabetes mellitus ([place]) 09/14/2019  . Hyponatremia   . Right supracondylar humerus fracture, closed, initial encounter 08/20/2019  . Abnormal EKG 08/10/2016  . Hypertension 08/10/2016     Problem List Items Addressed This Visit      Other   Postoperative infection - Primary    Ms. Mcquitty is a 79 year old female with postoperative infection from a  supracondylar humerus fracture repaired with open reduction internal fixation status post debridement from abscess with cultures showing no growth or organisms. She continues with Augmentin and doxycycline without problem. Check inflammatory markers today. Consider discontinuation of Augmentin and doxycycline pending blood work results. Continue to work with orthopedics and occupational therapy. Follow-up pending blood work if needed.      Relevant Orders   C-reactive protein   Sedimentation rate   CBC w/Diff      I am having Midge Minium. Kluth maintain her atorvastatin, ramipril, latanoprost, dorzolamide, metoprolol tartrate, metFORMIN,  insulin NPH-regular Human, hydrochlorothiazide, aspirin EC, Calcium Carbonate-Vit D-Min (CALCIUM 1200 PO), alendronate, methocarbamol, ondansetron, and amoxicillin-clavulanate.   Follow-up: Return if symptoms worsen or fail to improve.   Terri Piedra, MSN, FNP-C Nurse Practitioner [facility] Export number: [phone]

## 2019-11-21 LAB — CBC WITH DIFFERENTIAL/PLATELET
Absolute Monocytes: 924 cells/uL (ref 200–950)
Basophils Absolute: 26 cells/uL (ref 0–200)
Basophils Relative: 0.3 %
Eosinophils Absolute: 158 cells/uL (ref 15–500)
Eosinophils Relative: 1.8 %
HCT: 38.4 % (ref 35.0–45.0)
Hemoglobin: 12.4 g/dL (ref 11.7–15.5)
Lymphs Abs: 2191 cells/uL (ref 850–3900)
MCH: 28.7 pg (ref 27.0–33.0)
MCHC: 32.3 g/dL (ref 32.0–36.0)
MCV: 88.9 fL (ref 80.0–100.0)
MPV: 9.8 fL (ref 7.5–12.5)
Monocytes Relative: 10.5 %
Neutro Abs: 5500 cells/uL (ref 1500–7800)
Neutrophils Relative %: 62.5 %
Platelets: 310 10*3/uL (ref 140–400)
RBC: 4.32 10*6/uL (ref 3.80–5.10)
RDW: 15 % (ref 11.0–15.0)
Total Lymphocyte: 24.9 %
WBC: 8.8 10*3/uL (ref 3.8–10.8)

## 2019-11-21 LAB — SEDIMENTATION RATE: Sed Rate: 29 mm/h (ref 0–30)

## 2019-11-21 LAB — C-REACTIVE PROTEIN: CRP: 45.2 mg/L — ABNORMAL HIGH (ref <8.0)

## 2019-12-10 ENCOUNTER — Ambulatory Visit: Payer: Medicare HMO | Admitting: Orthopaedic Surgery

## 2019-12-10 ENCOUNTER — Other Ambulatory Visit (HOSPITAL_COMMUNITY)
Admission: RE | Admit: 2019-12-10 | Discharge: 2019-12-10 | Disposition: A | Discharge status: Home / Self Care | Payer: Medicare HMO | Source: Ambulatory Visit | Attending: Orthopaedic Surgery | Admitting: Orthopaedic Surgery

## 2019-12-10 ENCOUNTER — Other Ambulatory Visit: Payer: Self-pay

## 2019-12-10 ENCOUNTER — Encounter: Payer: Self-pay | Admitting: Orthopaedic Surgery

## 2019-12-10 ENCOUNTER — Encounter (HOSPITAL_BASED_OUTPATIENT_CLINIC_OR_DEPARTMENT_OTHER)
Admission: RE | Admit: 2019-12-10 | Discharge: 2019-12-10 | Disposition: A | Discharge status: Home / Self Care | Payer: Medicare HMO | Source: Ambulatory Visit | Attending: Orthopaedic Surgery | Admitting: Orthopaedic Surgery

## 2019-12-10 ENCOUNTER — Ambulatory Visit (INDEPENDENT_AMBULATORY_CARE_PROVIDER_SITE_OTHER): Payer: Medicare HMO

## 2019-12-10 ENCOUNTER — Encounter (HOSPITAL_BASED_OUTPATIENT_CLINIC_OR_DEPARTMENT_OTHER): Payer: Self-pay | Admitting: Orthopaedic Surgery

## 2019-12-10 DIAGNOSIS — S42411A Displaced simple supracondylar fracture without intercondylar fracture of right humerus, initial encounter for closed fracture: Secondary | ICD-10-CM

## 2019-12-10 DIAGNOSIS — E1139 Type 2 diabetes mellitus with other diabetic ophthalmic complication: Secondary | ICD-10-CM | POA: Diagnosis not present

## 2019-12-10 DIAGNOSIS — T8142XD Infection following a procedure, deep incisional surgical site, subsequent encounter: Secondary | ICD-10-CM

## 2019-12-10 DIAGNOSIS — X58XXXA Exposure to other specified factors, initial encounter: Secondary | ICD-10-CM | POA: Diagnosis not present

## 2019-12-10 DIAGNOSIS — Z7982 Long term (current) use of aspirin: Secondary | ICD-10-CM | POA: Diagnosis not present

## 2019-12-10 DIAGNOSIS — Z20822 Contact with and (suspected) exposure to covid-19: Secondary | ICD-10-CM | POA: Insufficient documentation

## 2019-12-10 DIAGNOSIS — Z79899 Other long term (current) drug therapy: Secondary | ICD-10-CM | POA: Diagnosis not present

## 2019-12-10 DIAGNOSIS — Z981 Arthrodesis status: Secondary | ICD-10-CM | POA: Diagnosis not present

## 2019-12-10 DIAGNOSIS — Z01812 Encounter for preprocedural laboratory examination: Secondary | ICD-10-CM | POA: Insufficient documentation

## 2019-12-10 DIAGNOSIS — Z823 Family history of stroke: Secondary | ICD-10-CM | POA: Diagnosis not present

## 2019-12-10 DIAGNOSIS — Z888 Allergy status to other drugs, medicaments and biological substances status: Secondary | ICD-10-CM | POA: Diagnosis not present

## 2019-12-10 DIAGNOSIS — T8131XA Disruption of external operation (surgical) wound, not elsewhere classified, initial encounter: Secondary | ICD-10-CM | POA: Diagnosis present

## 2019-12-10 DIAGNOSIS — R69 Illness, unspecified: Secondary | ICD-10-CM | POA: Diagnosis not present

## 2019-12-10 DIAGNOSIS — M1990 Unspecified osteoarthritis, unspecified site: Secondary | ICD-10-CM | POA: Diagnosis not present

## 2019-12-10 DIAGNOSIS — H42 Glaucoma in diseases classified elsewhere: Secondary | ICD-10-CM | POA: Diagnosis not present

## 2019-12-10 DIAGNOSIS — H409 Unspecified glaucoma: Secondary | ICD-10-CM | POA: Diagnosis not present

## 2019-12-10 DIAGNOSIS — E785 Hyperlipidemia, unspecified: Secondary | ICD-10-CM | POA: Diagnosis not present

## 2019-12-10 DIAGNOSIS — Z90710 Acquired absence of both cervix and uterus: Secondary | ICD-10-CM | POA: Diagnosis not present

## 2019-12-10 DIAGNOSIS — F329 Major depressive disorder, single episode, unspecified: Secondary | ICD-10-CM | POA: Diagnosis not present

## 2019-12-10 DIAGNOSIS — Z7984 Long term (current) use of oral hypoglycemic drugs: Secondary | ICD-10-CM | POA: Diagnosis not present

## 2019-12-10 DIAGNOSIS — E669 Obesity, unspecified: Secondary | ICD-10-CM | POA: Diagnosis not present

## 2019-12-10 DIAGNOSIS — R0600 Dyspnea, unspecified: Secondary | ICD-10-CM | POA: Diagnosis not present

## 2019-12-10 DIAGNOSIS — Z8249 Family history of ischemic heart disease and other diseases of the circulatory system: Secondary | ICD-10-CM | POA: Diagnosis not present

## 2019-12-10 DIAGNOSIS — Y939 Activity, unspecified: Secondary | ICD-10-CM | POA: Diagnosis not present

## 2019-12-10 DIAGNOSIS — Z6831 Body mass index (BMI) 31.0-31.9, adult: Secondary | ICD-10-CM | POA: Diagnosis not present

## 2019-12-10 DIAGNOSIS — I10 Essential (primary) hypertension: Secondary | ICD-10-CM | POA: Diagnosis not present

## 2019-12-10 DIAGNOSIS — Z881 Allergy status to other antibiotic agents status: Secondary | ICD-10-CM | POA: Diagnosis not present

## 2019-12-10 LAB — CBC WITH DIFFERENTIAL/PLATELET
Abs Immature Granulocytes: 0.06 10*3/uL (ref 0.00–0.07)
Basophils Absolute: 0 10*3/uL (ref 0.0–0.1)
Basophils Relative: 0 %
Eosinophils Absolute: 0.1 10*3/uL (ref 0.0–0.5)
Eosinophils Relative: 1 %
HCT: 36.9 % (ref 36.0–46.0)
Hemoglobin: 12.1 g/dL (ref 12.0–15.0)
Immature Granulocytes: 1 %
Lymphocytes Relative: 24 %
Lymphs Abs: 2.1 10*3/uL (ref 0.7–4.0)
MCH: 28.5 pg (ref 26.0–34.0)
MCHC: 32.8 g/dL (ref 30.0–36.0)
MCV: 87 fL (ref 80.0–100.0)
Monocytes Absolute: 0.8 10*3/uL (ref 0.1–1.0)
Monocytes Relative: 9 %
Neutro Abs: 5.7 10*3/uL (ref 1.7–7.7)
Neutrophils Relative %: 65 %
Platelets: 336 10*3/uL (ref 150–400)
RBC: 4.24 MIL/uL (ref 3.87–5.11)
RDW: 18.4 % — ABNORMAL HIGH (ref 11.5–15.5)
WBC: 8.7 10*3/uL (ref 4.0–10.5)
nRBC: 0 % (ref 0.0–0.2)

## 2019-12-10 LAB — PROTIME-INR
INR: 1.1 (ref 0.8–1.2)
Prothrombin Time: 13.8 seconds (ref 11.4–15.2)

## 2019-12-10 LAB — BASIC METABOLIC PANEL
Anion gap: 12 (ref 5–15)
BUN: 7 mg/dL — ABNORMAL LOW (ref 8–23)
CO2: 23 mmol/L (ref 22–32)
Calcium: 9.3 mg/dL (ref 8.9–10.3)
Chloride: 94 mmol/L — ABNORMAL LOW (ref 98–111)
Creatinine, Ser: 0.75 mg/dL (ref 0.44–1.00)
GFR calc Af Amer: >60 mL/min (ref >60)
GFR calc non Af Amer: >60 mL/min (ref >60)
Glucose, Bld: 230 mg/dL — ABNORMAL HIGH (ref 70–99)
Potassium: 4.4 mmol/L (ref 3.5–5.1)
Sodium: 129 mmol/L — ABNORMAL LOW (ref 135–145)

## 2019-12-10 LAB — SARS CORONAVIRUS 2 (TAT 6-24 HRS): SARS Coronavirus 2: NEGATIVE

## 2019-12-10 NOTE — Progress Notes (Signed)

## 2019-12-10 NOTE — Progress Notes (Signed)
Post-Op Visit Note   Patient: Kayla Griffin           Date of Birth: [birth year]           MRN: [id number] Visit Date: 12/10/2019 PCP: Aretta Nip, MD   Assessment & Plan:  Chief Complaint:  Chief Complaint  Patient presents with  . Right Elbow - Pain, Fracture   Visit Diagnoses:  1. Right supracondylar humerus fracture, closed, initial encounter   2. Infection of deep incisional surgical site after procedure, subsequent encounter     Plan: Kayla Griffin returns today for a wound check of her right elbow.  She noticed that the scab fell off and she has had dime size wound with hyper granulation tissue with serous drainage.  There are some surrounding local erythema without any evidence of cellulitis.  Pain level is unchanged.  Denies any constitutional symptoms.  Given these findings I think it is best to take her back to the operating room for a formal washout and excision of the dehiscence and local tissue rearrangement with possible hardware removal.  We will place her back on Augmentin and doxycycline postoperatively.  Questions encouraged and answered.  Dry dressing was applied today.  Follow-Up Instructions: Return if symptoms worsen or fail to improve.   Orders:  Orders Placed This Encounter  Procedures  . XR Elbow 2 Views Right   No orders of the defined types were placed in this encounter.   Imaging: No results found.  PMFS History: Patient Active Problem List   Diagnosis Date Noted  . Postoperative infection   . Abscess of right upper arm and forearm 09/15/2019  . Cellulitis of right upper extremity 09/14/2019  . Insulin-requiring or dependent type II diabetes mellitus ([place]) 09/14/2019  . Hyponatremia   . Right supracondylar humerus fracture, closed, initial encounter 08/20/2019  . Abnormal EKG 08/10/2016  . Hypertension 08/10/2016   Past Medical History:  Diagnosis Date  . Ambulates with cane   . Arthritis   . Cancer [facility])    Endometrial -  hysterectomy  . Depression   . Diabetes mellitus without complication ([place])    Type II  . Dyspnea    currently with arm pain 08/22/19  . Glaucoma   . HLD (hyperlipidemia)   . Hypertension   . Wears dentures     Family History  Problem Relation Age of Onset  . CVA Mother   . Heart disease Father 98  . Heart attack Sister   . Heart disease Sister   . Arrhythmia Sister   . CVA Sister     Past Surgical History:  Procedure Laterality Date  . ABDOMINAL HYSTERECTOMY    . CERVICAL FUSION  2007  . EYE SURGERY Bilateral    cataracts  . I & D EXTREMITY Right 09/15/2019   Procedure: IRRIGATION AND DEBRIDEMENT ELBOW;  Surgeon: Leandrew Koyanagi, MD;  Location: [place];  Service: Orthopedics;  Laterality: Right;  . ORIF HUMERUS FRACTURE Right 08/25/2019   Procedure: OPEN REDUCTION INTERNAL FIXATION (ORIF) RIGHT SUPRACONDYLAR HUMERUS FRACTURE, NEUROLYSIS ULNAR NERVE;  Surgeon: Leandrew Koyanagi, MD;  Location: [place];  Service: Orthopedics;  Laterality: Right;  . ORIF PATELLA Right 07/07/2016   Procedure: Partial Patellectomy;  Surgeon: Leandrew Koyanagi, MD;  Location: [place];  Service: Orthopedics;  Laterality: Right;   Social History   Occupational History  . Not on file  Tobacco Use  . Smoking status: Never Smoker  . Smokeless tobacco: Never Used  Vaping Use  .  Vaping Use: Never used  Substance and Sexual Activity  . Alcohol use: No  . Drug use: No  . Sexual activity: Not on file    Comment: Hysterectomy

## 2019-12-10 NOTE — Progress Notes (Signed)
Pt coming in for surgery 12/11/2019.  Pt arrived at [facility] for pre-op interview and lab work.  Dr. Erlinda Hong had not put orders in yet for this pt.  Dr. Phoebe Sharps office called and spoke with Dondra Spry, who spoke with Dr. Erlinda Hong.  Lab orders given to [facility], and RN verified and read them back.  Orders put in for lab work.

## 2019-12-11 ENCOUNTER — Encounter (HOSPITAL_BASED_OUTPATIENT_CLINIC_OR_DEPARTMENT_OTHER)
Admission: RE | Disposition: A | Discharge status: Home / Self Care | Payer: Self-pay | Source: Home / Self Care | Attending: Orthopaedic Surgery

## 2019-12-11 ENCOUNTER — Ambulatory Visit (HOSPITAL_BASED_OUTPATIENT_CLINIC_OR_DEPARTMENT_OTHER): Payer: Medicare HMO | Admitting: Anesthesiology

## 2019-12-11 ENCOUNTER — Encounter (HOSPITAL_BASED_OUTPATIENT_CLINIC_OR_DEPARTMENT_OTHER): Payer: Self-pay | Admitting: Orthopaedic Surgery

## 2019-12-11 ENCOUNTER — Other Ambulatory Visit: Payer: Self-pay

## 2019-12-11 ENCOUNTER — Ambulatory Visit (HOSPITAL_BASED_OUTPATIENT_CLINIC_OR_DEPARTMENT_OTHER)
Admission: RE | Admit: 2019-12-11 | Discharge: 2019-12-11 | Disposition: A | Discharge status: Home / Self Care | Payer: Medicare HMO | Attending: Orthopaedic Surgery | Admitting: Orthopaedic Surgery

## 2019-12-11 DIAGNOSIS — Z888 Allergy status to other drugs, medicaments and biological substances status: Secondary | ICD-10-CM | POA: Insufficient documentation

## 2019-12-11 DIAGNOSIS — F329 Major depressive disorder, single episode, unspecified: Secondary | ICD-10-CM | POA: Insufficient documentation

## 2019-12-11 DIAGNOSIS — I10 Essential (primary) hypertension: Secondary | ICD-10-CM | POA: Insufficient documentation

## 2019-12-11 DIAGNOSIS — Z8249 Family history of ischemic heart disease and other diseases of the circulatory system: Secondary | ICD-10-CM | POA: Insufficient documentation

## 2019-12-11 DIAGNOSIS — E669 Obesity, unspecified: Secondary | ICD-10-CM | POA: Insufficient documentation

## 2019-12-11 DIAGNOSIS — Z7982 Long term (current) use of aspirin: Secondary | ICD-10-CM | POA: Insufficient documentation

## 2019-12-11 DIAGNOSIS — Z6831 Body mass index (BMI) 31.0-31.9, adult: Secondary | ICD-10-CM | POA: Insufficient documentation

## 2019-12-11 DIAGNOSIS — T8130XA Disruption of wound, unspecified, initial encounter: Secondary | ICD-10-CM | POA: Diagnosis not present

## 2019-12-11 DIAGNOSIS — Z823 Family history of stroke: Secondary | ICD-10-CM | POA: Insufficient documentation

## 2019-12-11 DIAGNOSIS — E785 Hyperlipidemia, unspecified: Secondary | ICD-10-CM | POA: Insufficient documentation

## 2019-12-11 DIAGNOSIS — Z981 Arthrodesis status: Secondary | ICD-10-CM | POA: Insufficient documentation

## 2019-12-11 DIAGNOSIS — M1990 Unspecified osteoarthritis, unspecified site: Secondary | ICD-10-CM | POA: Diagnosis not present

## 2019-12-11 DIAGNOSIS — H409 Unspecified glaucoma: Secondary | ICD-10-CM | POA: Diagnosis not present

## 2019-12-11 DIAGNOSIS — Z881 Allergy status to other antibiotic agents status: Secondary | ICD-10-CM | POA: Insufficient documentation

## 2019-12-11 DIAGNOSIS — X58XXXA Exposure to other specified factors, initial encounter: Secondary | ICD-10-CM | POA: Insufficient documentation

## 2019-12-11 DIAGNOSIS — Y939 Activity, unspecified: Secondary | ICD-10-CM | POA: Insufficient documentation

## 2019-12-11 DIAGNOSIS — T8140XA Infection following a procedure, unspecified, initial encounter: Secondary | ICD-10-CM | POA: Diagnosis present

## 2019-12-11 DIAGNOSIS — T8131XA Disruption of external operation (surgical) wound, not elsewhere classified, initial encounter: Secondary | ICD-10-CM | POA: Insufficient documentation

## 2019-12-11 DIAGNOSIS — Z90710 Acquired absence of both cervix and uterus: Secondary | ICD-10-CM | POA: Insufficient documentation

## 2019-12-11 DIAGNOSIS — H42 Glaucoma in diseases classified elsewhere: Secondary | ICD-10-CM | POA: Insufficient documentation

## 2019-12-11 DIAGNOSIS — T8142XD Infection following a procedure, deep incisional surgical site, subsequent encounter: Secondary | ICD-10-CM | POA: Diagnosis not present

## 2019-12-11 DIAGNOSIS — R69 Illness, unspecified: Secondary | ICD-10-CM | POA: Diagnosis not present

## 2019-12-11 DIAGNOSIS — E1139 Type 2 diabetes mellitus with other diabetic ophthalmic complication: Secondary | ICD-10-CM | POA: Insufficient documentation

## 2019-12-11 DIAGNOSIS — Z79899 Other long term (current) drug therapy: Secondary | ICD-10-CM | POA: Insufficient documentation

## 2019-12-11 DIAGNOSIS — E871 Hypo-osmolality and hyponatremia: Secondary | ICD-10-CM | POA: Diagnosis not present

## 2019-12-11 DIAGNOSIS — Z7984 Long term (current) use of oral hypoglycemic drugs: Secondary | ICD-10-CM | POA: Insufficient documentation

## 2019-12-11 DIAGNOSIS — S42411A Displaced simple supracondylar fracture without intercondylar fracture of right humerus, initial encounter for closed fracture: Secondary | ICD-10-CM | POA: Diagnosis present

## 2019-12-11 DIAGNOSIS — R0600 Dyspnea, unspecified: Secondary | ICD-10-CM | POA: Diagnosis not present

## 2019-12-11 HISTORY — PX: IRRIGATION AND DEBRIDEMENT ELBOW: SHX6886

## 2019-12-11 LAB — GLUCOSE, CAPILLARY
Glucose-Capillary: 190 mg/dL — ABNORMAL HIGH (ref 70–99)
Glucose-Capillary: 135 mg/dL — ABNORMAL HIGH (ref 70–99)

## 2019-12-11 SURGERY — IRRIGATION AND DEBRIDEMENT ELBOW
Anesthesia: General | Site: Elbow | Laterality: Right

## 2019-12-11 MED ORDER — CEFAZOLIN SODIUM-DEXTROSE 2-3 GM-%(50ML) IV SOLR
INTRAVENOUS | Status: DC | PRN
  Administered 2019-12-11: 2 g via INTRAVENOUS

## 2019-12-11 MED ORDER — PROPOFOL 10 MG/ML IV BOLUS
INTRAVENOUS | Status: DC | PRN
  Administered 2019-12-11: 100 mg via INTRAVENOUS

## 2019-12-11 MED ORDER — PHENYLEPHRINE 40 MCG/ML (10ML) SYRINGE FOR IV PUSH (FOR BLOOD PRESSURE SUPPORT)
PREFILLED_SYRINGE | INTRAVENOUS | Status: AC
  Filled 2019-12-11: qty 10

## 2019-12-11 MED ORDER — LACTATED RINGERS IV SOLN: INTRAVENOUS | Status: DC

## 2019-12-11 MED ORDER — EPHEDRINE 5 MG/ML INJ
INTRAVENOUS | Status: AC
  Filled 2019-12-11: qty 10

## 2019-12-11 MED ORDER — DEXAMETHASONE SODIUM PHOSPHATE 10 MG/ML IJ SOLN
INTRAMUSCULAR | Status: DC | PRN
  Administered 2019-12-11: 4 mg via INTRAVENOUS

## 2019-12-11 MED ORDER — DOXYCYCLINE HYCLATE 100 MG PO TABS: 100.0000 mg | ORAL_TABLET | Freq: Two times a day (BID) | ORAL | 0 refills | Status: DC

## 2019-12-11 MED ORDER — OXYCODONE-ACETAMINOPHEN 5-325 MG PO TABS
ORAL_TABLET | ORAL | Status: AC
  Filled 2019-12-11: qty 1

## 2019-12-11 MED ORDER — OXYCODONE HCL 5 MG/5ML PO SOLN: 5.0000 mg | Freq: Once | ORAL | Status: DC | PRN

## 2019-12-11 MED ORDER — HYDROCODONE-ACETAMINOPHEN 5-325 MG PO TABS
1.0000 | ORAL_TABLET | Freq: Three times a day (TID) | ORAL | 0 refills | Status: DC | PRN
Start: 2019-12-11 — End: 2020-01-29

## 2019-12-11 MED ORDER — VANCOMYCIN HCL 500 MG IV SOLR
INTRAVENOUS | Status: DC | PRN
  Administered 2019-12-11: 500 mg via TOPICAL

## 2019-12-11 MED ORDER — FENTANYL CITRATE (PF) 100 MCG/2ML IJ SOLN
INTRAMUSCULAR | Status: AC
Start: 2019-12-11 — End: ?
  Filled 2019-12-11: qty 2

## 2019-12-11 MED ORDER — ONDANSETRON HCL 4 MG/2ML IJ SOLN
INTRAMUSCULAR | Status: DC | PRN
  Administered 2019-12-11: 4 mg via INTRAVENOUS

## 2019-12-11 MED ORDER — AMISULPRIDE (ANTIEMETIC) 5 MG/2ML IV SOLN: 10.0000 mg | Freq: Once | INTRAVENOUS | Status: DC | PRN

## 2019-12-11 MED ORDER — AMOXICILLIN-POT CLAVULANATE 875-125 MG PO TABS
1.0000 | ORAL_TABLET | Freq: Two times a day (BID) | ORAL | 2 refills | Status: DC
Start: 2019-12-11 — End: 2020-07-07

## 2019-12-11 MED ORDER — PROMETHAZINE HCL 25 MG/ML IJ SOLN: 6.2500 mg | INTRAMUSCULAR | Status: DC | PRN

## 2019-12-11 MED ORDER — PROPOFOL 500 MG/50ML IV EMUL
INTRAVENOUS | Status: DC | PRN
  Administered 2019-12-11: 25 ug/kg/min via INTRAVENOUS

## 2019-12-11 MED ORDER — VANCOMYCIN HCL 500 MG IV SOLR
INTRAVENOUS | Status: AC
  Filled 2019-12-11: qty 500

## 2019-12-11 MED ORDER — HYDROCODONE-ACETAMINOPHEN 5-325 MG PO TABS
ORAL_TABLET | ORAL | Status: AC
  Filled 2019-12-11: qty 1

## 2019-12-11 MED ORDER — OXYCODONE HCL 5 MG PO TABS: 5.0000 mg | ORAL_TABLET | Freq: Once | ORAL | Status: DC | PRN

## 2019-12-11 MED ORDER — OXYCODONE HCL 5 MG PO TABS
ORAL_TABLET | ORAL | Status: AC
  Filled 2019-12-11: qty 1

## 2019-12-11 MED ORDER — LIDOCAINE HCL (CARDIAC) PF 100 MG/5ML IV SOSY
PREFILLED_SYRINGE | INTRAVENOUS | Status: DC | PRN
  Administered 2019-12-11: 60 mg via INTRAVENOUS

## 2019-12-11 MED ORDER — PROPOFOL 10 MG/ML IV BOLUS: INTRAVENOUS | Status: DC | PRN

## 2019-12-11 MED ORDER — SODIUM CHLORIDE 0.9 % IR SOLN
Status: DC | PRN
  Administered 2019-12-11: 6000 mL

## 2019-12-11 MED ORDER — FENTANYL CITRATE (PF) 100 MCG/2ML IJ SOLN
INTRAMUSCULAR | Status: DC | PRN
Start: 2019-12-11 — End: 2019-12-11
  Administered 2019-12-11 (×2): 50 ug via INTRAVENOUS

## 2019-12-11 MED ORDER — HYDROMORPHONE HCL 1 MG/ML IJ SOLN: 0.2500 mg | INTRAMUSCULAR | Status: DC | PRN

## 2019-12-11 MED ORDER — HYDROCODONE-ACETAMINOPHEN 5-325 MG PO TABS
1.0000 | ORAL_TABLET | Freq: Once | ORAL | Status: AC
  Administered 2019-12-11: 1 via ORAL

## 2019-12-11 SURGICAL SUPPLY — 79 items
APL SKNCLS STERI-STRIP NONHPOA (GAUZE/BANDAGES/DRESSINGS)
BANDAGE ESMARK 6X9 LF (GAUZE/BANDAGES/DRESSINGS) ×1 IMPLANT
BENZOIN TINCTURE PRP APPL 2/3 (GAUZE/BANDAGES/DRESSINGS) IMPLANT
BLADE HEX COATED 2.75 (ELECTRODE) IMPLANT
BLADE SURG 15 STRL LF DISP TIS (BLADE) ×4 IMPLANT
BLADE SURG 15 STRL SS (BLADE) ×6
BNDG CMPR 9X6 STRL LF SNTH (GAUZE/BANDAGES/DRESSINGS)
BNDG COHESIVE 3X5 TAN STRL LF (GAUZE/BANDAGES/DRESSINGS) ×3 IMPLANT
BNDG COHESIVE 4X5 TAN STRL (GAUZE/BANDAGES/DRESSINGS) ×2 IMPLANT
BNDG ELASTIC 4X5.8 VLCR STR LF (GAUZE/BANDAGES/DRESSINGS) ×4 IMPLANT
BNDG ELASTIC 6X5.8 VLCR STR LF (GAUZE/BANDAGES/DRESSINGS) ×1 IMPLANT
BNDG ESMARK 6X9 LF (GAUZE/BANDAGES/DRESSINGS)
CANISTER SUCT 1200ML W/VALVE (MISCELLANEOUS) ×3 IMPLANT
COVER BACK TABLE 60X90IN (DRAPES) ×3 IMPLANT
COVER WAND RF STERILE (DRAPES) IMPLANT
CUFF TOURN SGL QUICK 18X4 (TOURNIQUET CUFF) ×2 IMPLANT
CUFF TOURN SGL QUICK 24 (TOURNIQUET CUFF)
CUFF TOURN SGL QUICK 34 (TOURNIQUET CUFF)
CUFF TRNQT CYL 24X4X16.5-23 (TOURNIQUET CUFF) IMPLANT
CUFF TRNQT CYL 34X4.125X (TOURNIQUET CUFF) IMPLANT
DECANTER SPIKE VIAL GLASS SM (MISCELLANEOUS) IMPLANT
DRAPE EXTREMITY T 121X128X90 (DISPOSABLE) ×3 IMPLANT
DRAPE HALF SHEET 70X43 (DRAPES) ×3 IMPLANT
DRAPE IMP U-DRAPE 54X76 (DRAPES) ×3 IMPLANT
DRAPE OEC MINIVIEW 54X84 (DRAPES) ×1 IMPLANT
DRAPE U-SHAPE 47X51 STRL (DRAPES) ×2 IMPLANT
DURAPREP 26ML APPLICATOR (WOUND CARE) ×5 IMPLANT
ELECT REM PT RETURN 9FT ADLT (ELECTROSURGICAL) ×3
ELECTRODE REM PT RTRN 9FT ADLT (ELECTROSURGICAL) ×2 IMPLANT
GAUZE 4X4 16PLY RFD (DISPOSABLE) IMPLANT
GAUZE SPONGE 4X4 12PLY STRL (GAUZE/BANDAGES/DRESSINGS) ×3 IMPLANT
GAUZE XEROFORM 1X8 LF (GAUZE/BANDAGES/DRESSINGS) ×3 IMPLANT
GLOVE BIOGEL PI IND STRL 7.0 (GLOVE) ×2 IMPLANT
GLOVE BIOGEL PI INDICATOR 7.0 (GLOVE) ×1
GLOVE ECLIPSE 7.0 STRL STRAW (GLOVE) ×3 IMPLANT
GLOVE SKINSENSE NS SZ7.5 (GLOVE) ×1
GLOVE SKINSENSE STRL SZ7.5 (GLOVE) ×2 IMPLANT
GLOVE SURG SYN 7.5  E (GLOVE) ×3
GLOVE SURG SYN 7.5 E (GLOVE) ×2 IMPLANT
GLOVE SURG SYN 7.5 PF PI (GLOVE) ×1 IMPLANT
GOWN STRL REIN XL XLG (GOWN DISPOSABLE) ×3 IMPLANT
GOWN STRL REUS W/ TWL LRG LVL3 (GOWN DISPOSABLE) ×2 IMPLANT
GOWN STRL REUS W/ TWL XL LVL3 (GOWN DISPOSABLE) ×2 IMPLANT
GOWN STRL REUS W/TWL LRG LVL3 (GOWN DISPOSABLE) ×3
GOWN STRL REUS W/TWL XL LVL3 (GOWN DISPOSABLE) ×3
NEEDLE HYPO 22GX1.5 SAFETY (NEEDLE) IMPLANT
NS IRRIG 1000ML POUR BTL (IV SOLUTION) ×3 IMPLANT
PACK BASIN DAY SURGERY FS (CUSTOM PROCEDURE TRAY) ×3 IMPLANT
PAD CAST 3X4 CTTN HI CHSV (CAST SUPPLIES) IMPLANT
PAD CAST 4YDX4 CTTN HI CHSV (CAST SUPPLIES) ×2 IMPLANT
PADDING CAST COTTON 3X4 STRL (CAST SUPPLIES)
PADDING CAST COTTON 4X4 STRL (CAST SUPPLIES) ×6
PADDING CAST COTTON 6X4 STRL (CAST SUPPLIES) IMPLANT
PADDING CAST SYN 6 (CAST SUPPLIES)
PADDING CAST SYNTHETIC 4 (CAST SUPPLIES) ×1
PADDING CAST SYNTHETIC 4X4 STR (CAST SUPPLIES) ×1 IMPLANT
PADDING CAST SYNTHETIC 6X4 NS (CAST SUPPLIES) IMPLANT
PENCIL SMOKE EVACUATOR (MISCELLANEOUS) ×3 IMPLANT
SLEEVE SCD COMPRESS KNEE MED (MISCELLANEOUS) ×2 IMPLANT
SPLINT FAST PLASTER 5X30 (CAST SUPPLIES)
SPLINT FIBERGLASS 3X35 (CAST SUPPLIES) IMPLANT
SPLINT FIBERGLASS 4X30 (CAST SUPPLIES) ×2 IMPLANT
SPLINT PLASTER CAST FAST 5X30 (CAST SUPPLIES) IMPLANT
SPONGE LAP 18X18 RF (DISPOSABLE) ×2 IMPLANT
STAPLER VISISTAT (STAPLE) IMPLANT
STOCKINETTE IMPERVIOUS LG (DRAPES) ×2 IMPLANT
STRIP CLOSURE SKIN 1/2X4 (GAUZE/BANDAGES/DRESSINGS) IMPLANT
SUCTION FRAZIER HANDLE 10FR (MISCELLANEOUS) ×3
SUCTION TUBE FRAZIER 10FR DISP (MISCELLANEOUS) ×2 IMPLANT
SUT ETHILON 3 0 PS 1 (SUTURE) ×2 IMPLANT
SUT MNCRL AB 4-0 PS2 18 (SUTURE) IMPLANT
SUT VIC AB 2-0 CT1 27 (SUTURE)
SUT VIC AB 2-0 CT1 TAPERPNT 27 (SUTURE) IMPLANT
SYR BULB EAR ULCER 3OZ GRN STR (SYRINGE) ×3 IMPLANT
SYR CONTROL 10ML LL (SYRINGE) IMPLANT
TOWEL GREEN STERILE FF (TOWEL DISPOSABLE) ×3 IMPLANT
TUBE CONNECTING 20X1/4 (TUBING) ×3 IMPLANT
UNDERPAD 30X36 HEAVY ABSORB (UNDERPADS AND DIAPERS) ×3 IMPLANT
YANKAUER SUCT BULB TIP NO VENT (SUCTIONS) IMPLANT

## 2019-12-11 NOTE — Anesthesia Postprocedure Evaluation (Signed)
Anesthesia Post Note  Patient: Kayla Griffin  Procedure(s) Performed: RIGHT ELBOW IRRIGATION AND DEBRIDEMENT (Right Elbow)     Patient location during evaluation: PACU Anesthesia Type: General Level of consciousness: awake and alert Pain management: pain level controlled Vital Signs Assessment: post-procedure vital signs reviewed and stable Respiratory status: spontaneous breathing, nonlabored ventilation and respiratory function stable Cardiovascular status: blood pressure returned to baseline and stable Postop Assessment: no apparent nausea or vomiting Anesthetic complications: no   No complications documented.  Last Vitals:  Vitals:   12/11/19 1642 12/11/19 1650  BP:  (!) 162/91  Pulse: 88 86  Resp: 15 16  Temp:  36.5 C  SpO2: 100% 100%    Last Pain:  Vitals:   12/11/19 1650  PainSc: [place] Rasa Degrazia

## 2019-12-11 NOTE — Transfer of Care (Signed)
Immediate Anesthesia Transfer of Care Note  Patient: Kayla Griffin  Procedure(s) Performed: RIGHT ELBOW IRRIGATION AND DEBRIDEMENT, POSSIBLE HARDWARE REMOVAL (Right )  Patient Location: PACU  Anesthesia Type:General  Level of Consciousness: drowsy and patient cooperative  Airway & Oxygen Therapy: Patient Spontanous Breathing and Patient connected to face mask oxygen  Post-op Assessment: Report given to RN and Post -op Vital signs reviewed and stable  Post vital signs: Reviewed and stable  Last Vitals:  Vitals Value Taken Time  BP 106/61 12/11/19 1604  Temp    Pulse 93 12/11/19 1605  Resp 13 12/11/19 1605  SpO2 100 % 12/11/19 1605  Vitals shown include unvalidated device data.  Last Pain:  Vitals:   12/11/19 1307  PainSc: 1          Complications: No complications documented.

## 2019-12-11 NOTE — H&P (Signed)
PREOPERATIVE H&P  Chief Complaint: right elbow wound, retained hardware  HPI: Kayla Griffin is a 79 y.o. female who presents for surgical treatment of right elbow wound, retained hardware.  She denies any changes in medical history.  Past Medical History:  Diagnosis Date  . Ambulates with cane   . Arthritis   . Cancer [facility])    Endometrial - hysterectomy  . Depression   . Diabetes mellitus without complication ([place])    Type II  . Dyspnea    currently with arm pain 08/22/19  . Glaucoma   . HLD (hyperlipidemia)   . Hypertension   . Wears dentures    Past Surgical History:  Procedure Laterality Date  . ABDOMINAL HYSTERECTOMY    . CERVICAL FUSION  2007  . EYE SURGERY Bilateral    cataracts  . I & D EXTREMITY Right 09/15/2019   Procedure: IRRIGATION AND DEBRIDEMENT ELBOW;  Surgeon: Leandrew Koyanagi, MD;  Location: [place];  Service: Orthopedics;  Laterality: Right;  . ORIF HUMERUS FRACTURE Right 08/25/2019   Procedure: OPEN REDUCTION INTERNAL FIXATION (ORIF) RIGHT SUPRACONDYLAR HUMERUS FRACTURE, NEUROLYSIS ULNAR NERVE;  Surgeon: Leandrew Koyanagi, MD;  Location: [place];  Service: Orthopedics;  Laterality: Right;  . ORIF PATELLA Right 07/07/2016   Procedure: Partial Patellectomy;  Surgeon: Leandrew Koyanagi, MD;  Location: [place];  Service: Orthopedics;  Laterality: Right;   Social History   Socioeconomic History  . Marital status: Single    Spouse name: Not on file  . Number of children: Not on file  . Years of education: Not on file  . Highest education level: Not on file  Occupational History  . Not on file  Tobacco Use  . Smoking status: Never Smoker  . Smokeless tobacco: Never Used  Vaping Use  . Vaping Use: Never used  Substance and Sexual Activity  . Alcohol use: No  . Drug use: No  . Sexual activity: Not Currently    Birth control/protection: Surgical    Comment: Hysterectomy  Other Topics Concern  . Not on file  Social History Narrative  . Not on file   Social  Determinants of Health   Financial Resource Strain:   . Difficulty of Paying Living Expenses: Not on file  Food Insecurity:   . Worried About Charity fundraiser in the Last Year: Not on file  . Ran Out of Food in the Last Year: Not on file  Transportation Needs:   . Lack of Transportation (Medical): Not on file  . Lack of Transportation (Non-Medical): Not on file  Physical Activity:   . Days of Exercise per Week: Not on file  . Minutes of Exercise per Session: Not on file  Stress:   . Feeling of Stress : Not on file  Social Connections:   . Frequency of Communication with Friends and Family: Not on file  . Frequency of Social Gatherings with Friends and Family: Not on file  . Attends Religious Services: Not on file  . Active Member of Clubs or Organizations: Not on file  . Attends Archivist Meetings: Not on file  . Marital Status: Not on file   Family History  Problem Relation Age of Onset  . CVA Mother   . Heart disease Father 70  . Heart attack Sister   . Heart disease Sister   . Arrhythmia Sister   . CVA Sister    Allergies  Allergen Reactions  . Actos [Pioglitazone] Swelling  SWELLING REACTION UNSPECIFIED   . Cephalexin Nausea And Vomiting   Prior to Admission medications   Medication Sig Start Date End Date Taking? Authorizing Provider  alendronate (FOSAMAX) 70 MG tablet Take 70 mg by mouth every Sunday. Take with a full glass of water on an empty stomach.   Yes [provider]  amoxicillin-clavulanate (AUGMENTIN) 875-125 MG tablet Take 1 tablet by mouth 2 (two) times daily. 10/28/19  Yes Golden Circle, FNP  aspirin EC 325 MG tablet Take 1 tablet (325 mg total) by mouth daily. 07/07/16  Yes Leandrew Koyanagi, MD  atorvastatin (LIPITOR) 40 MG tablet Take 40 mg by mouth daily.  05/16/16  Yes [provider]  Calcium Carbonate-Vit D-Min (CALCIUM 1200 PO) Take 1,200 mg by mouth daily.   Yes [provider]  dorzolamide (TRUSOPT) 2 %  ophthalmic solution Place 1 drop into both eyes 2 (two) times daily.    Yes [provider]  hydrochlorothiazide (HYDRODIURIL) 25 MG tablet Take 25 mg by mouth daily.    Yes [provider]  insulin NPH-regular Human (NOVOLIN 70/30) (70-30) 100 UNIT/ML injection Inject into the skin 2 (two) times daily with a meal. TAKE 15U IN AM AND 15U IN PM   Yes [provider]  latanoprost (XALATAN) 0.005 % ophthalmic solution Place 1 drop into both eyes at bedtime.  06/15/16  Yes [provider]  metFORMIN (GLUCOPHAGE) 500 MG tablet Take 500 mg by mouth 2 (two) times daily with a meal.   Yes [provider]  methocarbamol (ROBAXIN) 750 MG tablet Take 1 tablet (750 mg total) by mouth 2 (two) times daily as needed for muscle spasms. 08/25/19  Yes Leandrew Koyanagi, MD  metoprolol (LOPRESSOR) 100 MG tablet Take 100 mg by mouth 2 (two) times daily.   Yes [provider]  ramipril (ALTACE) 10 MG capsule Take 20 mg by mouth daily.  05/16/16  Yes [provider]     Positive ROS: All other systems have been reviewed and were otherwise negative with the exception of those mentioned in the HPI and as above.  Physical Exam: General: Alert, no acute distress Cardiovascular: No pedal edema Respiratory: No cyanosis, no use of accessory musculature GI: abdomen soft Skin: No lesions in the area of chief complaint Neurologic: Sensation intact distally Psychiatric: Patient is competent for consent with normal mood and affect Lymphatic: no lymphedema  MUSCULOSKELETAL: exam stable  Assessment: right elbow wound, retained hardware  Plan: Plan for Procedure(s): RIGHT ELBOW IRRIGATION AND DEBRIDEMENT, POSSIBLE HARDWARE REMOVAL  The risks benefits and alternatives were discussed with the patient including but not limited to the risks of nonoperative treatment, versus surgical intervention including infection, bleeding, nerve injury,  blood clots, cardiopulmonary  complications, morbidity, mortality, among others, and they were willing to proceed.   Preoperative templating of the joint replacement has been completed, documented, and submitted to the Operating Room personnel in order to optimize intra-operative equipment management.   Eduard Roux, MD 12/11/2019 6:40 AM

## 2019-12-11 NOTE — Anesthesia Procedure Notes (Signed)
Procedure Name: LMA Insertion Date/Time: 12/11/2019 3:09 PM Performed by: Signe Colt, CRNA Pre-anesthesia Checklist: Patient identified, Emergency Drugs available, Suction available and Patient being monitored Patient Re-evaluated:Patient Re-evaluated prior to induction Oxygen Delivery Method: Circle system utilized Preoxygenation: Pre-oxygenation with 100% oxygen Induction Type: IV induction Ventilation: Mask ventilation without difficulty LMA: LMA inserted LMA Size: 4.0 Number of attempts: 1 Airway Equipment and Method: Bite block Placement Confirmation: positive ETCO2 Tube secured with: Tape Dental Injury: Teeth and Oropharynx as per pre-operative assessment

## 2019-12-11 NOTE — Anesthesia Preprocedure Evaluation (Signed)
Anesthesia Evaluation  Patient identified by MRN, date of birth, ID band Patient awake    Reviewed: Allergy & Precautions, H&P , NPO status , Patient's Chart, lab work & pertinent test results  Airway Mallampati: II  TM Distance: >3 FB Neck ROM: Full    Dental no notable dental hx. (+) Edentulous Upper, Edentulous Lower   Pulmonary neg pulmonary ROS,    Pulmonary exam normal        Cardiovascular hypertension, Pt. on medications and Pt. on home beta blockers Normal cardiovascular exam     Neuro/Psych Depression negative neurological ROS     GI/Hepatic negative GI ROS, Neg liver ROS,   Endo/Other  diabetes, Type 2, Insulin Dependent, Oral Hypoglycemic Agents  Renal/GU negative Renal ROS  negative genitourinary   Musculoskeletal  (+) Arthritis , Osteoarthritis,    Abdominal (+) + obese,   Peds  Hematology negative hematology ROS (+)   Anesthesia Other Findings   Reproductive/Obstetrics negative OB ROS                             Anesthesia Physical  Anesthesia Plan  ASA: III  Anesthesia Plan: General   Post-op Pain Management:    Induction: Intravenous  PONV Risk Score and Plan: 3 and Ondansetron, Treatment may vary due to age or medical condition, Dexamethasone and Midazolam  Airway Management Planned: LMA  Additional Equipment:   Intra-op Plan:   Post-operative Plan: Extubation in OR  Informed Consent: I have reviewed the patients History and Physical, chart, labs and discussed the procedure including the risks, benefits and alternatives for the proposed anesthesia with the patient or authorized representative who has indicated his/her understanding and acceptance.     Dental advisory given  Plan Discussed with: CRNA  Anesthesia Plan Comments:         Anesthesia Quick Evaluation

## 2019-12-11 NOTE — Op Note (Signed)
   Date of Surgery: 12/11/2019  INDICATIONS: Ms. Kayla Griffin is a 79 y.o.-year-old female with a right elbow wound dehiscence.  The patient did consent to the procedure after discussion of the risks and benefits.  PREOPERATIVE DIAGNOSIS: Right elbow wound dehiscence  POSTOPERATIVE DIAGNOSIS: Same.  PROCEDURE:  1.  Irrigation debridement of right elbow wound dehiscence including skin, subcutaneous tissue 25 cm 2.  Secondary closure of surgical wound dehiscence  SURGEON: N. Eduard Roux, M.D.  ASSIST: Ciro Backer [place], [place]; necessary for the timely completion of procedure and due to complexity of procedure.  ANESTHESIA:  general  IV FLUIDS AND URINE: See anesthesia.  ESTIMATED BLOOD LOSS: Minimal mL.  IMPLANTS: None  DRAINS: None  COMPLICATIONS: see description of procedure.  DESCRIPTION OF PROCEDURE: The patient was brought to the operating room.  The patient had been signed prior to the procedure and this was documented. The patient had the anesthesia placed by the anesthesiologist.  A time-out was performed to confirm that this was the correct patient, site, side and location. The patient did receive antibiotics prior to the incision and was re-dosed during the procedure as needed at indicated intervals.  A tourniquet was placed on the upper arm.  The patient had the operative extremity prepped and draped in the standard surgical fashion.    The wound dehiscence was fully excised sharply with a 15 blade.  Sharp excisional debridement was then continued down through the subcutaneous tissue.  There was no gross purulence.  The tissue was sent for culture.  The appearance was more consistent with fatty necrosis of the subcutaneous tissue.  There was undermining and tunneling down onto the hardware.  I perform sharp excisional debridement of this area which included approximately 25 cm with a rondure.  I left the hardware in place as the fractures have not healed.  This was then thoroughly  irrigated with 6 L normal saline.  500 mg of vancomycin powder was placed in the surgical wound.  The wound was then closed in a layered fashion using 2-0 Monocryl and 3-0 nylon.  Sterile dressings were applied.  Long-arm splint was placed.  Patient tolerated procedure well had no immediate complications.  POSTOPERATIVE PLAN: Follow-up in 1 week for wound recheck.  We will place her back on Augmentin and doxycycline.  Azucena Cecil, MD 3:51 PM

## 2019-12-11 NOTE — Discharge Instructions (Signed)
Postoperative instructions:  Weightbearing instructions: as tolerated  Dressing instructions: Keep your dressing and/or splint clean and dry at all times.  It will be removed at your first post-operative appointment.  Your stitches and/or staples will be removed at this visit.  Incision instructions:  Do not soak your incision for 3 weeks after surgery.  If the incision gets wet, pat dry and do not scrub the incision.  Pain control:  You have been given a prescription to be taken as directed for post-operative pain control.  In addition, elevate the operative extremity above the heart at all times to prevent swelling and throbbing pain.  Take over-the-counter Colace, 100mg  by mouth twice a day while taking narcotic pain medications to help prevent constipation.  Follow up appointments: 1) 7 days for wound check. 2) Dr. Erlinda Hong as scheduled.   -------------------------------------------------------------------------------------------------------------  After Surgery Pain Control:  After your surgery, post-surgical discomfort or pain is likely. This discomfort can last several days to a few weeks. At certain times of the day your discomfort may be more intense.  Did you receive a nerve block?  A nerve block can provide pain relief for one hour to two days after your surgery. As long as the nerve block is working, you will experience little or no sensation in the area the surgeon operated on.  As the nerve block wears off, you will begin to experience pain or discomfort. It is very important that you begin taking your prescribed pain medication before the nerve block fully wears off. Treating your pain at the first sign of the block wearing off will ensure your pain is better controlled and more tolerable when full-sensation returns. Do not wait until the pain is intolerable, as the medicine will be less effective. It is better to treat pain in advance than to try and catch up.  General  Anesthesia:  If you did not receive a nerve block during your surgery, you will need to start taking your pain medication shortly after your surgery and should continue to do so as prescribed by your surgeon.  Pain Medication:  Most commonly we prescribe Vicodin and Percocet for post-operative pain. Both of these medications contain a combination of acetaminophen (Tylenol) and a narcotic to help control pain.   It takes between 30 and 45 minutes before pain medication starts to work. It is important to take your medication before your pain level gets too intense.   Nausea is a common side effect of many pain medications. You will want to eat something before taking your pain medicine to help prevent nausea.   If you are taking a prescription pain medication that contains acetaminophen, we recommend that you do not take additional over the counter acetaminophen (Tylenol).  Other pain relieving options:   Using a cold pack to ice the affected area a few times a day (15 to 20 minutes at a time) can help to relieve pain, reduce swelling and bruising.   Elevation of the affected area can also help to reduce pain and swelling.   Post Anesthesia Home Care Instructions  Activity: Get plenty of rest for the remainder of the day. A responsible individual must stay with you for 24 hours following the procedure.  For the next 24 hours, DO NOT: -Drive a car -Paediatric nurse -Drink alcoholic beverages -Take any medication unless instructed by your physician -Make any legal decisions or sign important papers.  Meals: Start with liquid foods such as gelatin or soup. Progress to regular  foods as tolerated. Avoid greasy, spicy, heavy foods. If nausea and/or vomiting occur, drink only clear liquids until the nausea and/or vomiting subsides. Call your physician if vomiting continues.  Special Instructions/Symptoms: Your throat may feel dry or sore from the anesthesia or the breathing tube placed in  your throat during surgery. If this causes discomfort, gargle with warm salt water. The discomfort should disappear within 24 hours.  If you had a scopolamine patch placed behind your ear for the management of post- operative nausea and/or vomiting:  1. The medication in the patch is effective for 72 hours, after which it should be removed.  Wrap patch in a tissue and discard in the trash. Wash hands thoroughly with soap and water. 2. You may remove the patch earlier than 72 hours if you experience unpleasant side effects which may include dry mouth, dizziness or visual disturbances. 3. Avoid touching the patch. Wash your hands with soap and water after contact with the patch.

## 2019-12-12 ENCOUNTER — Encounter (HOSPITAL_BASED_OUTPATIENT_CLINIC_OR_DEPARTMENT_OTHER): Payer: Self-pay | Admitting: Orthopaedic Surgery

## 2019-12-16 LAB — AEROBIC/ANAEROBIC CULTURE W GRAM STAIN (SURGICAL/DEEP WOUND): Culture: NO GROWTH

## 2019-12-17 ENCOUNTER — Ambulatory Visit: Payer: Medicare HMO | Admitting: Occupational Therapy

## 2019-12-18 ENCOUNTER — Telehealth: Payer: Self-pay | Admitting: Orthopaedic Surgery

## 2019-12-18 ENCOUNTER — Ambulatory Visit (INDEPENDENT_AMBULATORY_CARE_PROVIDER_SITE_OTHER): Payer: Medicare HMO | Admitting: Orthopaedic Surgery

## 2019-12-18 DIAGNOSIS — T8142XD Infection following a procedure, deep incisional surgical site, subsequent encounter: Secondary | ICD-10-CM

## 2019-12-18 MED ORDER — COLLAGENASE 250 UNIT/GM EX OINT: 1.0000 "application " | TOPICAL_OINTMENT | Freq: Every day | CUTANEOUS | 0 refills | Status: DC

## 2019-12-18 NOTE — Telephone Encounter (Signed)
Patient called requesting for Dr. Erlinda Hong sent in collagenase ointment. Patient states the cost is $250.00 and can Dr. Erlinda Hong call in a different ointment that cost way less and does the same as ointment prescribed. Please call patient when new prescription has been sent in. Patient phone number is [phone].

## 2019-12-18 NOTE — Progress Notes (Signed)
Post-Op Visit Note   Patient: Kayla Griffin           Date of Birth: 12/01/1940           MRN: [id number] Visit Date: 12/18/2019 PCP: Kayla Nip, MD   Assessment & Plan:  Chief Complaint:  Chief Complaint  Patient presents with  . Right Elbow - Routine Post Op   Visit Diagnoses:  1. Infection of deep incisional surgical site after procedure, subsequent encounter     Plan: [name] is 1 week status post right surgical wound dehiscence I&D and closure.  She returns today for follow-up.  She does have a small area of fibrinous exudative tissue.  Local erythema.  No signs of infection.  I sent in a prescription for Santyl ointment to use on the wound.  Sutures removed today.  Follow-up in 2 weeks with two-view x-rays of the right elbow.  She will continue to take her doxycycline prescription as prescribed.  Follow-Up Instructions: Return in about 2 weeks (around 01/01/2020).   Orders:  No orders of the defined types were placed in this encounter.  Meds ordered this encounter  Medications  . collagenase (SANTYL) ointment    Sig: Apply 1 application topically daily.    Dispense:  15 g    Refill:  0    Imaging: No results found.  PMFS History: Patient Active Problem List   Diagnosis Date Noted  . Postoperative infection   . Abscess of right upper arm and forearm 09/15/2019  . Cellulitis of right upper extremity 09/14/2019  . Insulin-requiring or dependent type II diabetes mellitus ([place]) 09/14/2019  . Hyponatremia   . Right supracondylar humerus fracture, closed, initial encounter 08/20/2019  . Abnormal EKG 08/10/2016  . Hypertension 08/10/2016   Past Medical History:  Diagnosis Date  . Ambulates with cane   . Arthritis   . Cancer [facility])    Endometrial - hysterectomy  . Depression   . Diabetes mellitus without complication ([place])    Type II  . Dyspnea    currently with arm pain 08/22/19  . Glaucoma   . HLD (hyperlipidemia)   . Hypertension   . Wears  dentures     Family History  Problem Relation Age of Onset  . CVA Mother   . Heart disease Father 40  . Heart attack Sister   . Heart disease Sister   . Arrhythmia Sister   . CVA Sister     Past Surgical History:  Procedure Laterality Date  . ABDOMINAL HYSTERECTOMY    . CERVICAL FUSION  2007  . EYE SURGERY Bilateral    cataracts  . I & D EXTREMITY Right 09/15/2019   Procedure: IRRIGATION AND DEBRIDEMENT ELBOW;  Surgeon: Leandrew Koyanagi, MD;  Location: [facility];  Service: Orthopedics;  Laterality: Right;  . IRRIGATION AND DEBRIDEMENT ELBOW Right 12/11/2019   Procedure: RIGHT ELBOW IRRIGATION AND DEBRIDEMENT;  Surgeon: Leandrew Koyanagi, MD;  Location: [facility];  Service: Orthopedics;  Laterality: Right;  . ORIF HUMERUS FRACTURE Right 08/25/2019   Procedure: OPEN REDUCTION INTERNAL FIXATION (ORIF) RIGHT SUPRACONDYLAR HUMERUS FRACTURE, NEUROLYSIS ULNAR NERVE;  Surgeon: Leandrew Koyanagi, MD;  Location: [facility];  Service: Orthopedics;  Laterality: Right;  . ORIF PATELLA Right 07/07/2016   Procedure: Partial Patellectomy;  Surgeon: Leandrew Koyanagi, MD;  Location: [facility];  Service: Orthopedics;  Laterality: Right;   Social History   Occupational History  . Not on file  Tobacco Use  . Smoking  status: Never Smoker  . Smokeless tobacco: Never Used  Vaping Use  . Vaping Use: Never used  Substance and Sexual Activity  . Alcohol use: No  . Drug use: No  . Sexual activity: Not Currently    Birth control/protection: Surgical    Comment: Hysterectomy

## 2019-12-18 NOTE — Telephone Encounter (Signed)
Unfortunately, there is not a good alternative.  Can you explain to her how to do wet-to-dry dressing changes and have her do this twice a day and we can see how she does

## 2019-12-18 NOTE — Telephone Encounter (Signed)
Can you advise on this one?

## 2019-12-18 NOTE — Telephone Encounter (Signed)
Patient aware of the below message  

## 2019-12-25 ENCOUNTER — Encounter (INDEPENDENT_AMBULATORY_CARE_PROVIDER_SITE_OTHER): Payer: Medicare HMO | Admitting: Ophthalmology

## 2019-12-25 ENCOUNTER — Inpatient Hospital Stay: Payer: Medicare HMO | Admitting: Orthopaedic Surgery

## 2020-01-01 ENCOUNTER — Ambulatory Visit (INDEPENDENT_AMBULATORY_CARE_PROVIDER_SITE_OTHER): Payer: Medicare HMO | Admitting: Orthopaedic Surgery

## 2020-01-01 ENCOUNTER — Encounter: Payer: Self-pay | Admitting: Orthopaedic Surgery

## 2020-01-01 ENCOUNTER — Ambulatory Visit: Payer: Self-pay

## 2020-01-01 DIAGNOSIS — M25521 Pain in right elbow: Secondary | ICD-10-CM | POA: Diagnosis not present

## 2020-01-01 MED ORDER — DOXYCYCLINE HYCLATE 100 MG PO TABS
100.0000 mg | ORAL_TABLET | Freq: Two times a day (BID) | ORAL | 0 refills | Status: DC
Start: 2020-01-01 — End: 2020-07-07

## 2020-01-01 NOTE — Progress Notes (Signed)
Post-Op Visit Note   Patient: Kayla Griffin           Date of Birth: 1940-11-29           MRN: [id number] Visit Date: 01/01/2020 PCP: Aretta Nip, MD   Assessment & Plan:  Chief Complaint:  Chief Complaint  Patient presents with  . Right Elbow - Pain   Visit Diagnoses:  1. Pain in right elbow     Plan: Darl is status post I&D right elbow. Doing well overall. Finished doxycycline last week. Occasional pain at times. Overall doing well. The wound is slowly healing. There is some fibrinous exudative tissue. She is doing wet-to-dry dressings twice a day. Could not afford Santyl ointment. X-rays demonstrate continued fracture healing and osteotomy healing with abundant callus formation. At this point I would like to renew her doxycycline for another month. Continue wet-to-dry dressings twice a day to the wound. Recheck in 4 weeks. We discussed that hardware removal may be a possibility in the future.  Follow-Up Instructions: Return in about 4 weeks (around 01/29/2020).   Orders:  Orders Placed This Encounter  Procedures  . XR Elbow 2 Views Right   Meds ordered this encounter  Medications  . doxycycline (VIBRA-TABS) 100 MG tablet    Sig: Take 1 tablet (100 mg total) by mouth 2 (two) times daily.    Dispense:  56 tablet    Refill:  0    Imaging: XR Elbow 2 Views Right  Result Date: 01/01/2020 Progressive fracture healing with consolidation and callus formation.   PMFS History: Patient Active Problem List   Diagnosis Date Noted  . Postoperative infection   . Abscess of right upper arm and forearm 09/15/2019  . Cellulitis of right upper extremity 09/14/2019  . Insulin-requiring or dependent type II diabetes mellitus ([place]) 09/14/2019  . Hyponatremia   . Right supracondylar humerus fracture, closed, initial encounter 08/20/2019  . Abnormal EKG 08/10/2016  . Hypertension 08/10/2016   Past Medical History:  Diagnosis Date  . Ambulates with cane   .  Arthritis   . Cancer [facility])    Endometrial - hysterectomy  . Depression   . Diabetes mellitus without complication ([place])    Type II  . Dyspnea    currently with arm pain 08/22/19  . Glaucoma   . HLD (hyperlipidemia)   . Hypertension   . Wears dentures     Family History  Problem Relation Age of Onset  . CVA Mother   . Heart disease Father 62  . Heart attack Sister   . Heart disease Sister   . Arrhythmia Sister   . CVA Sister     Past Surgical History:  Procedure Laterality Date  . ABDOMINAL HYSTERECTOMY    . CERVICAL FUSION  2007  . EYE SURGERY Bilateral    cataracts  . I & D EXTREMITY Right 09/15/2019   Procedure: IRRIGATION AND DEBRIDEMENT ELBOW;  Surgeon: Leandrew Koyanagi, MD;  Location: [facility];  Service: Orthopedics;  Laterality: Right;  . IRRIGATION AND DEBRIDEMENT ELBOW Right 12/11/2019   Procedure: RIGHT ELBOW IRRIGATION AND DEBRIDEMENT;  Surgeon: Leandrew Koyanagi, MD;  Location: [facility];  Service: Orthopedics;  Laterality: Right;  . ORIF HUMERUS FRACTURE Right 08/25/2019   Procedure: OPEN REDUCTION INTERNAL FIXATION (ORIF) RIGHT SUPRACONDYLAR HUMERUS FRACTURE, NEUROLYSIS ULNAR NERVE;  Surgeon: Leandrew Koyanagi, MD;  Location: [facility];  Service: Orthopedics;  Laterality: Right;  . ORIF PATELLA Right 07/07/2016   Procedure: Partial Patellectomy;  Surgeon: Leandrew Koyanagi, MD;  Location: [facility];  Service: Orthopedics;  Laterality: Right;   Social History   Occupational History  . Not on file  Tobacco Use  . Smoking status: Never Smoker  . Smokeless tobacco: Never Used  Vaping Use  . Vaping Use: Never used  Substance and Sexual Activity  . Alcohol use: No  . Drug use: No  . Sexual activity: Not Currently    Birth control/protection: Surgical    Comment: Hysterectomy

## 2020-01-06 ENCOUNTER — Encounter (INDEPENDENT_AMBULATORY_CARE_PROVIDER_SITE_OTHER): Payer: Self-pay | Admitting: Ophthalmology

## 2020-01-06 ENCOUNTER — Ambulatory Visit (INDEPENDENT_AMBULATORY_CARE_PROVIDER_SITE_OTHER): Payer: Medicare HMO | Admitting: Ophthalmology

## 2020-01-06 ENCOUNTER — Other Ambulatory Visit: Payer: Self-pay

## 2020-01-06 DIAGNOSIS — H353131 Nonexudative age-related macular degeneration, bilateral, early dry stage: Secondary | ICD-10-CM | POA: Diagnosis not present

## 2020-01-06 DIAGNOSIS — E113293 Type 2 diabetes mellitus with mild nonproliferative diabetic retinopathy without macular edema, bilateral: Secondary | ICD-10-CM | POA: Diagnosis not present

## 2020-01-06 DIAGNOSIS — H35363 Drusen (degenerative) of macula, bilateral: Secondary | ICD-10-CM | POA: Diagnosis not present

## 2020-01-06 DIAGNOSIS — H43812 Vitreous degeneration, left eye: Secondary | ICD-10-CM | POA: Diagnosis not present

## 2020-01-06 NOTE — Assessment & Plan Note (Signed)
Today no detectable diabetic retinopathy, this could be a reflection of advanced open-angle glaucoma which usually lessens the severity of clinically detectable diabetic eye disease

## 2020-01-06 NOTE — Assessment & Plan Note (Signed)

## 2020-01-06 NOTE — Progress Notes (Signed)
01/06/2020     CHIEF COMPLAINT Patient presents for Retina Follow Up   HISTORY OF PRESENT ILLNESS: Kayla Griffin is a 79 y.o. female who presents to the clinic today for:   HPI    Retina Follow Up    Patient presents with  Diabetic Retinopathy.  In both eyes.  This started 1 year ago.  Severity is mild.  Duration of 1 year.  Since onset it is stable.          Comments    1 Year Diabetic F/U OU  Pt denies noticeable changes to [place] OU since last visit. Pt denies ocular pain, flashes of light, or floaters OU.  A1c: 6.9, 08/2019 LBS: does not recall        Last edited by Rockie Neighbours, [place] on 01/06/2020 10:30 AM. (History)      Referring physician: Aretta Nip, [place],  [place] [address]  HISTORICAL INFORMATION:   Selected notes from the MEDICAL RECORD NUMBER    Lab Results  Component Value Date   HGBA1C 7.3 (H) 09/15/2019     CURRENT MEDICATIONS: Current Outpatient Medications (Ophthalmic Drugs)  Medication Sig  . dorzolamide (TRUSOPT) 2 % ophthalmic solution Place 1 drop into both eyes 2 (two) times daily.   Marland Kitchen latanoprost (XALATAN) 0.005 % ophthalmic solution Place 1 drop into both eyes at bedtime.    No current facility-administered medications for this visit. (Ophthalmic Drugs)   Current Outpatient Medications (Other)  Medication Sig  . alendronate (FOSAMAX) 70 MG tablet Take 70 mg by mouth every Sunday. Take with a full glass of water on an empty stomach.  Marland Kitchen amoxicillin-clavulanate (AUGMENTIN) 875-125 MG tablet Take 1 tablet by mouth 2 (two) times daily.  Marland Kitchen aspirin EC 325 MG tablet Take 1 tablet (325 mg total) by mouth daily.  Marland Kitchen atorvastatin (LIPITOR) 40 MG tablet Take 40 mg by mouth daily.   . Calcium Carbonate-Vit D-Min (CALCIUM 1200 PO) Take 1,200 mg by mouth daily.  . collagenase (SANTYL) ointment Apply 1 application topically daily.  Marland Kitchen doxycycline (VIBRA-TABS) 100 MG tablet Take 1 tablet (100 mg total) by mouth 2 (two)  times daily.  . hydrochlorothiazide (HYDRODIURIL) 25 MG tablet Take 25 mg by mouth daily.   Marland Kitchen HYDROcodone-acetaminophen (NORCO) 5-325 MG tablet Take 1-2 tablets by mouth 3 (three) times daily as needed.  . insulin NPH-regular Human (NOVOLIN 70/30) (70-30) 100 UNIT/ML injection Inject into the skin 2 (two) times daily with a meal. TAKE 15U IN AM AND 15U IN PM  . metFORMIN (GLUCOPHAGE) 500 MG tablet Take 500 mg by mouth 2 (two) times daily with a meal.  . methocarbamol (ROBAXIN) 750 MG tablet Take 1 tablet (750 mg total) by mouth 2 (two) times daily as needed for muscle spasms.  . metoprolol (LOPRESSOR) 100 MG tablet Take 100 mg by mouth 2 (two) times daily.  . ramipril (ALTACE) 10 MG capsule Take 20 mg by mouth daily.    No current facility-administered medications for this visit. (Other)      REVIEW OF SYSTEMS:    ALLERGIES Allergies  Allergen Reactions  . Actos [Pioglitazone] Swelling    SWELLING REACTION UNSPECIFIED   . Cephalexin Nausea And Vomiting    PAST MEDICAL HISTORY Past Medical History:  Diagnosis Date  . Ambulates with cane   . Arthritis   . Cancer [facility])    Endometrial - hysterectomy  . Depression   . Diabetes mellitus without complication ([place])  Type II  . Dyspnea    currently with arm pain 08/22/19  . Glaucoma   . HLD (hyperlipidemia)   . Hypertension   . Wears dentures    Past Surgical History:  Procedure Laterality Date  . ABDOMINAL HYSTERECTOMY    . CERVICAL FUSION  2007  . EYE SURGERY Bilateral    cataracts  . I & D EXTREMITY Right 09/15/2019   Procedure: IRRIGATION AND DEBRIDEMENT ELBOW;  Surgeon: Leandrew Koyanagi, MD;  Location: [facility];  Service: Orthopedics;  Laterality: Right;  . IRRIGATION AND DEBRIDEMENT ELBOW Right 12/11/2019   Procedure: RIGHT ELBOW IRRIGATION AND DEBRIDEMENT;  Surgeon: Leandrew Koyanagi, MD;  Location: [facility];  Service: Orthopedics;  Laterality: Right;  . ORIF HUMERUS FRACTURE Right 08/25/2019   Procedure: OPEN  REDUCTION INTERNAL FIXATION (ORIF) RIGHT SUPRACONDYLAR HUMERUS FRACTURE, NEUROLYSIS ULNAR NERVE;  Surgeon: Leandrew Koyanagi, MD;  Location: [facility];  Service: Orthopedics;  Laterality: Right;  . ORIF PATELLA Right 07/07/2016   Procedure: Partial Patellectomy;  Surgeon: Leandrew Koyanagi, MD;  Location: [facility];  Service: Orthopedics;  Laterality: Right;    FAMILY HISTORY Family History  Problem Relation Age of Onset  . CVA Mother   . Heart disease Father 70  . Heart attack Sister   . Heart disease Sister   . Arrhythmia Sister   . CVA Sister     SOCIAL HISTORY Social History   Tobacco Use  . Smoking status: Never Smoker  . Smokeless tobacco: Never Used  Vaping Use  . Vaping Use: Never used  Substance Use Topics  . Alcohol use: No  . Drug use: No         OPHTHALMIC EXAM: Base Eye Exam    Visual Acuity (ETDRS)      Right Left   Dist [place] 20/40 20/40   Dist ph  20/40 +2 NI       Tonometry (Tonopen, 10:31 AM)      Right Left   Pressure 09 13       Pupils      Pupils Shape React APD   Right PERRL Round Brisk None   Left PERRL Round Brisk None       Visual Fields (Counting fingers)      Left Right    Full Full       Extraocular Movement      Right Left    Full Full       Neuro/Psych    Oriented x3: Yes   Mood/Affect: Normal       Dilation    Both eyes: 1.0% Mydriacyl, 2.5% Phenylephrine @ 10:33 AM        Slit Lamp and Fundus Exam    External Exam      Right Left   External Normal Normal       Slit Lamp Exam      Right Left   Lids/Lashes Normal Normal   Conjunctiva/Sclera White and quiet White and quiet   Cornea Clear Clear   Anterior Chamber Deep and quiet Deep and quiet   Iris Round and reactive Round and reactive   Lens Posterior chamber intraocular lens Posterior chamber intraocular lens   Anterior Vitreous Normal Normal       Fundus Exam      Right Left   Posterior Vitreous Posterior vitreous detachment Posterior vitreous detachment   Disc  Normal Normal   C/D Ratio 0.75 0.8   Macula Hard drusen Hard drusen  Vessels no DR no DR   Periphery  no tears, old hole treated it  no tears, old hole treated it          IMAGING AND PROCEDURES  Imaging and Procedures for 01/06/20  OCT, Retina - OU - Both Eyes       Right Eye Quality was good. Scan locations included subfoveal. Central Foveal Thickness: 207. Findings include abnormal foveal contour, central retinal atrophy, outer retinal atrophy.   Left Eye Quality was good. Scan locations included subfoveal. Findings include abnormal foveal contour, central retinal atrophy, outer retinal atrophy.   Notes Diffuse macular atrophy particularly along the temporal arcades likely represents advanced open-angle glaucoma and loss of the retinal nerve fiber layer OU                ASSESSMENT/PLAN:  Early stage nonexudative age-related macular degeneration of both eyes The nature of age--related macular degeneration was discussed with the patient as well as the distinction between dry and wet types. Checking an [place]ler Grid daily with advice to return immediately should a distortion develop, was given to the patient. The patient 's smoking status now and in the past was determined and advice based on the AREDS study was provided regarding the consumption of antioxidant supplements. AREDS 2 vitamin formulation was recommended. Consumption of dark leafy vegetables and fresh fruits of various colors was recommended. Treatment modalities for wet macular degeneration particularly the use of intravitreal injections of anti-blood vessel growth factors was discussed with the patient. Avastin, Lucentis, and Eylea are the available options. On occasion, therapy includes the use of photodynamic therapy and thermal laser. Stressed to the patient do not rub eyes.  Patient was advised to check Amsler Grid daily and return immediately if changes are noted. Instructions on using the grid were given to  the patient. All patient questions were answered.      ICD-10-CM   1. Nonproliferative diabetic retinopathy of both eyes ([place])  L46.5035   2. Degenerative retinal drusen of both eyes  H35.363   3. Posterior vitreous detachment of left eye  H43.812   4. Early stage nonexudative age-related macular degeneration of both eyes  H35.3131 OCT, Retina - OU - Both Eyes    1.  Patient to continue to see Dr. Quentin Ore regarding open angle glaucoma  2.  No detectable diabetic retinopathy OU as compared to the past were only mild changes were not noted  3.  Next report any new onset visual acuity declines or changes  Ophthalmic Meds Ordered this visit:  No orders of the defined types were placed in this encounter.      Return in about 1 year (around 01/05/2021) for COLOR FP, DILATE OU.  There are no Patient Instructions on file for this visit.   Explained the diagnoses, plan, and follow up with the patient and they expressed understanding.  Patient expressed understanding of the importance of proper follow up care.   Clent Demark Loris Seelye M.D. Diseases & Surgery of the Retina and Vitreous Retina & Diabetic [place] 01/06/20     Abbreviations: M myopia (nearsighted); A astigmatism; H hyperopia (farsighted); P presbyopia; Mrx spectacle prescription;  CTL contact lenses; OD right eye; OS left eye; OU both eyes  XT exotropia; ET esotropia; PEK punctate epithelial keratitis; PEE punctate epithelial erosions; DES dry eye syndrome; MGD meibomian gland dysfunction; ATs artificial tears; PFAT's preservative free artificial tears; [place] nuclear sclerotic cataract; PSC posterior subcapsular cataract; ERM epi-retinal membrane; PVD posterior vitreous detachment; RD  retinal detachment; DM diabetes mellitus; DR diabetic retinopathy; NPDR non-proliferative diabetic retinopathy; PDR proliferative diabetic retinopathy; CSME clinically significant macular edema; DME diabetic macular edema; dbh dot blot hemorrhages;  CWS cotton wool spot; POAG primary open angle glaucoma; C/D cup-to-disc ratio; HVF humphrey visual field; GVF goldmann visual field; OCT optical coherence tomography; IOP intraocular pressure; BRVO Branch retinal vein occlusion; CRVO central retinal vein occlusion; CRAO central retinal artery occlusion; BRAO branch retinal artery occlusion; RT retinal tear; SB scleral buckle; PPV pars plana vitrectomy; VH Vitreous hemorrhage; PRP panretinal laser photocoagulation; IVK intravitreal kenalog; VMT vitreomacular traction; MH Macular hole;  NVD neovascularization of the disc; NVE neovascularization elsewhere; AREDS age related eye disease study; ARMD age related macular degeneration; POAG primary open angle glaucoma; EBMD epithelial/anterior basement membrane dystrophy; ACIOL anterior chamber intraocular lens; IOL intraocular lens; PCIOL posterior chamber intraocular lens; Phaco/IOL phacoemulsification with intraocular lens placement; [place] photorefractive keratectomy; LASIK laser assisted in situ keratomileusis; HTN hypertension; DM diabetes mellitus; COPD chronic obstructive pulmonary disease

## 2020-01-12 ENCOUNTER — Encounter: Payer: Self-pay | Admitting: Occupational Therapy

## 2020-01-12 ENCOUNTER — Telehealth: Payer: Self-pay | Admitting: Occupational Therapy

## 2020-01-12 ENCOUNTER — Ambulatory Visit: Payer: Medicare HMO | Attending: Orthopaedic Surgery | Admitting: Occupational Therapy

## 2020-01-12 ENCOUNTER — Other Ambulatory Visit: Payer: Self-pay

## 2020-01-12 DIAGNOSIS — R2689 Other abnormalities of gait and mobility: Secondary | ICD-10-CM | POA: Insufficient documentation

## 2020-01-12 DIAGNOSIS — M6281 Muscle weakness (generalized): Secondary | ICD-10-CM | POA: Insufficient documentation

## 2020-01-12 DIAGNOSIS — M25611 Stiffness of right shoulder, not elsewhere classified: Secondary | ICD-10-CM | POA: Insufficient documentation

## 2020-01-12 DIAGNOSIS — R600 Localized edema: Secondary | ICD-10-CM | POA: Diagnosis not present

## 2020-01-12 DIAGNOSIS — R2681 Unsteadiness on feet: Secondary | ICD-10-CM

## 2020-01-12 DIAGNOSIS — R293 Abnormal posture: Secondary | ICD-10-CM | POA: Insufficient documentation

## 2020-01-12 DIAGNOSIS — M25621 Stiffness of right elbow, not elsewhere classified: Secondary | ICD-10-CM | POA: Diagnosis not present

## 2020-01-12 DIAGNOSIS — R29818 Other symptoms and signs involving the nervous system: Secondary | ICD-10-CM | POA: Insufficient documentation

## 2020-01-12 NOTE — Addendum Note (Signed)
Addended by: [name] D on: 01/12/2020 06:59 PM   Modules accepted: Orders

## 2020-01-12 NOTE — Telephone Encounter (Signed)
Dr. Erlinda Hong,  I evaluated Caesar Bookman today for occupational therapy to address RUE per your referral.  During evaluation, pt reported that she is unable to ambulate stairs in her home to be able to get to her bedroom and full bathroom, which are on the second floor.  She also reports desire to be able to use a cane instead of walker (as she did previously).  Due to her history of falls with injury, she would benefit from outpatient physical therapy to address functional mobility, balance, gait training/assessment for appropriate assistive device, and safety due to fall risk.  If you agree, please enter referral for outpatient physical therapy via Epic.  Thank you,  [name], OTR/L [facility] [address]. [place], [place]  [address] [phone] phone [phone] 01/12/20 7:07 PM

## 2020-01-12 NOTE — Therapy (Addendum)
[facility] [address] [place], [place], [address] Phone: [phone]   Fax:  [phone]  Occupational Therapy Evaluation  Patient Details  Name: Kayla Griffin MRN: [id number] Date of Birth: 27-Feb-1941 Referring Provider (OT): Dr. Frankey Shown   Encounter Date: 01/12/2020   OT End of Session - 01/12/20 1602    Visit Number 1    Number of Visits 13    Date for OT Re-Evaluation 04/11/20    Authorization Type [employer]    Authorization - Visit Number 1    Authorization - Number of Visits 10    Progress Note Due on Visit 10    OT Start Time 1102    OT Stop Time 1148    OT Time Calculation (min) 46 min    Activity Tolerance Patient tolerated treatment well    Behavior During Therapy [facility] for tasks assessed/performed           Past Medical History:  Diagnosis Date  . Ambulates with cane   . Arthritis   . Cancer [facility])    Endometrial - hysterectomy  . Depression   . Diabetes mellitus without complication ([place])    Type II  . Dyspnea    currently with arm pain 08/22/19  . Glaucoma   . HLD (hyperlipidemia)   . Hypertension   . Wears dentures     Past Surgical History:  Procedure Laterality Date  . ABDOMINAL HYSTERECTOMY    . CERVICAL FUSION  2007  . EYE SURGERY Bilateral    cataracts  . I & D EXTREMITY Right 09/15/2019   Procedure: IRRIGATION AND DEBRIDEMENT ELBOW;  Surgeon: Leandrew Koyanagi, MD;  Location: [place];  Service: Orthopedics;  Laterality: Right;  . IRRIGATION AND DEBRIDEMENT ELBOW Right 12/11/2019   Procedure: RIGHT ELBOW IRRIGATION AND DEBRIDEMENT;  Surgeon: Leandrew Koyanagi, MD;  Location: [place];  Service: Orthopedics;  Laterality: Right;  . ORIF HUMERUS FRACTURE Right 08/25/2019   Procedure: OPEN REDUCTION INTERNAL FIXATION (ORIF) RIGHT SUPRACONDYLAR HUMERUS FRACTURE, NEUROLYSIS ULNAR NERVE;  Surgeon: Leandrew Koyanagi, MD;  Location: [place];  Service: Orthopedics;  Laterality:  Right;  . ORIF PATELLA Right 07/07/2016   Procedure: Partial Patellectomy;  Surgeon: Leandrew Koyanagi, MD;  Location: [place];  Service: Orthopedics;  Laterality: Right;    There were no vitals filed for this visit.   Subjective Assessment - 01/12/20 1113    Subjective  Pt reports recent fall Sunday am--fell off couch while sleeping and had to call 911 as she was unable to get up due to RUE (can't get on knees due to previous R knee injury), but no injury with this fall.    Patient Stated Goals be able to use arm more, be able to use my cane and not need walker (was using cane in R hand prior), be able to go upstairs again    Currently in Pain? Yes    Pain Score 4     Pain Orientation Right    Pain Descriptors / Indicators Aching    Pain Type Acute pain    Pain Onset More than a month ago    Pain Frequency Intermittent    Aggravating Factors  weather    Pain Relieving Factors Tylenol arthritis/pain meds at night             [facility] OT Assessment - 01/12/20 0001      Assessment   Medical Diagnosis ORIF R supracondylar humerus fx with  abscess of R upper arm/forearm s/p I&D x2     Referring Provider (OT) Dr. Frankey Shown    Onset Date/Surgical Date 08/19/19   original injury with I&D 09/14/19 and 12/18/19   Hand Dominance Right    Next MD Visit 01/29/20    Prior Therapy Therapy at [facility] after initial surgery      Precautions   Precautions Fall      Balance Screen   Has the patient fallen in the past 6 months Yes    How many times? 2    Has the patient had a decrease in activity level because of a fear of falling?  Yes      Home  Environment   Family/patient expects to be discharged to: Private residence    Home Layout Two level   unable to go upstairs   Bathroom Shower/Tub --   washing at sink, only 1/2 bath downstairs   Lives With Alone      Prior Function   Level of Independence Independent      ADL   Eating/Feeding --   using RUE only 50% of time   Grooming --   LUE  assisting RUE at times   Upper Body Bathing Modified independent    Lower Body Bathing Modified independent    Upper Body Dressing --   mod I   Lower Body Dressing Modified independent    Toilet Transfer Modified independent    Toileting - Clothing Manipulation Modified independent    [place] Transfer --   washing at sink currently   ADL comments pt reports BADLs mod I, but reports R 3rd digit flex intermittently with reaching.  Pt reports cooking/cleaning mod I.   Has RW with tray for inside use.  Pt reports that she sleeps on couch since injury due to inability to go upstairs.  Neighbor gets clothes from upstairs closet for her.  Can't lift gallon of milk.      IADL   Prior Level of Function Shopping independent    Shopping Takes care of all shopping needs independently    Prior Level of Function Light Housekeeping independent    Light Housekeeping --   mod I for light tasks   Prior Level of Function Meal Prep independent    Meal Prep --   mod I for light tasks   Prior Level of Function Passenger transport manager own vehicle    Prior Level of Function Financial Management independent    Physiological scientist financial matters independently (budgets, writes checks, pays rent, bills goes to bank), collects and keeps track of income      Mobility   Mobility Status History of falls    Mobility Status Comments ambulates with RW in the home (with tray) and one for in communiity.  Used cane prior to RUE fx.   unable to go upstairs     Written Expression   Dominant Hand Right    Handwriting --   mild difficulty holding pen, doesn't write much     Observation/Other Assessments   Skin Integrity small open area lateral R elbow from last surgery.  Pt hypersensitive to touch at original incision site dorsal elbow      Sensation   Additional Comments pt denies numbness/tingling.  Incision site/scar  sensitive to touch      Coordination   Coordination pt denies difficulty      Edema  Edema mod edema noted at R elbow      ROM / Strength   AROM / PROM / Strength AROM;Strength      AROM   Overall AROM  Deficits    Overall AROM Comments R shoulder flex 90* (needed cueing as pt goes into abduction/scaption when trying to raise arm), abduction 105*, approx 50% ER, elbow flex 125* and -70* extension.  Supination/pronation WNL, wrist ext WNL but noted 3rd digit MP flex with wrist ext      Strength   Overall Strength Deficits    Overall Strength Comments R shoulder flex grossly 3+/5, elbow flex/ext grossly 3+/5   pain appears to limit     Hand Function   Right Hand Grip (lbs) 25    Left Hand Grip (lbs) 40.7                           OT Education - 01/12/20 1757    Education Details Pt instructed to begin light desentization techniques at dorsal elbow (clean hands, light rubbing/tapping with fingers, avoiding small open area); Discussed OT eval results/POC and recommendation for Physical therapy based on pt goals    Person(s) Educated Patient    Methods Explanation    Comprehension Verbalized understanding            OT Short Term Goals - 01/12/20 1840      OT SHORT TERM GOAL #1   Title Pt will be independent with initial HEP.--check STGs 02/23/20    Time 6    Period Weeks    Status New      OT SHORT TERM GOAL #2   Title Pt will demo at least -55* R elbow ext for functional reach.    Baseline -70*    Time 6    Period Weeks    Status New      OT SHORT TERM GOAL #3   Title Pt will demo at least 135* R elbow flexion for incr ease with using RUE to eat/care for hair.    Baseline 125*    Time 12    Period Weeks    Status New      OT SHORT TERM GOAL #4   Title Pt will demo at least 100* R shoulder flexion for functional reach    Baseline 90*    Time 6    Period Weeks    Status New      OT SHORT TERM GOAL #5   Title Pt will improve R grip strength  by at least 5lbs to assist with lifting/opening containers.    Time 6    Period Weeks    Status New      OT SHORT TERM GOAL #6   Title Pt will be independent with edema management and desensitization techniques.    Time 6    Period Weeks    Status New             OT Long Term Goals - 01/12/20 1844      OT LONG TERM GOAL #1   Title Pt will be independent with updated HEP for strengthening.--check LTGs 04/11/20    Time 12    Period Weeks    Status New      OT LONG TERM GOAL #2   Title Pt will demo at least -45* R elbow ext for functional reach    Baseline -70*    Time 12    Period Weeks  Status New      OT LONG TERM GOAL #3   Title Pt will improve RUE strength be able to lift gallon of milk at midlevel to place in cart or pour.    Time 12    Period Weeks    Status New      OT LONG TERM GOAL #4   Title Pt will improve R grip strength by at least 10lbs for lifting.    Baseline 25lbs    Time 12    Period Weeks    Status New      OT LONG TERM GOAL #5   Title Pt will demo at least 110* R shoulder flex for functional reaching and incr ease with hair care.    Baseline 90*    Time 12    Period Weeks    Status New      OT LONG TERM GOAL #6   Title Pt will be able to eat with dominant RUE 100% of the time.    Baseline 50%    Time 12    Period Weeks    Status New                 Plan - 01/12/20 1825    Clinical Impression Statement Pt is a 79 y.o. female with ORIF R supracondylar humerus fx and s/p I&D x2 (09/14/19 and 12/18/19) due to abcess and deep infection.  Pt reports that she has been on antibiotiics for 14 weeks (with 2.5 weeks left).  Pt with PMH that also includes:arthritis, endometrial CA, depression, Glaucoma, HLD, HTN, ORIF R patella.  Pt was independent prior to initial injury.  Pt is currently mod I, but unable to use dominant RUE for all tasks or return to using cane for ambulation (used in RUE previously).  Pt presents today with decr strength, decr  ROM, edema, hypersensitivity, decr functional mobility, and decr dominant RUE functional use.  Pt would benefit from occupational therapy to address these deficits for incr RUE functional use and ADL/IADL performance and safety.    OT Occupational Profile and History Detailed Assessment- Review of Records and additional review of physical, cognitive, psychosocial history related to current functional performance    Occupational performance deficits (Please refer to evaluation for details): ADL's;IADL's    Body Structure / Function / Physical Skills Balance;Mobility;Skin integrity;Strength;Edema;ADL;UE functional use;Pain;ROM;Sensation;IADL    Rehab Potential Fair    Clinical Decision Making Several treatment options, min-mod task modification necessary    Comorbidities Affecting Occupational Performance: May have comorbidities impacting occupational performance    Modification or Assistance to Complete Evaluation  Min-Moderate modification of tasks or assist with assess necessary to complete eval    OT Frequency 1x / week   at pt request due to financial concerns   OT Duration 12 weeks   +eval, may d/c earlier dependent on progress   OT Treatment/Interventions Therapeutic exercise;Functional Mobility Training;Self-care/ADL training;Ultrasound;Neuromuscular education;Manual Therapy;Splinting;Therapeutic activities;Cryotherapy;DME and/or AE instruction;Scar mobilization;Electrical Stimulation;Moist Heat;Contrast Bath;Passive range of motion;Patient/family education    Plan initiate HEP for AAROM/self PROM, grip strength    Recommended Other Services physical therapy due to hx of falls, and decr functional mobility (unable to go upstairs)    Consulted and Agree with Plan of Care Patient           Patient will benefit from skilled therapeutic intervention in order to improve the following deficits and impairments:   Body Structure / Function / Physical Skills: Balance, Mobility, Skin integrity,  Strength, Edema, ADL, UE  functional use, Pain, ROM, Sensation, IADL       Visit Diagnosis: Stiffness of right elbow, not elsewhere classified - Plan: Ot plan of care cert/re-cert  Stiffness of right shoulder, not elsewhere classified - Plan: Ot plan of care cert/re-cert  Other symptoms and signs involving the nervous system - Plan: Ot plan of care cert/re-cert  Muscle weakness (generalized) - Plan: Ot plan of care cert/re-cert  Localized edema - Plan: Ot plan of care cert/re-cert  Unsteadiness on feet - Plan: Ot plan of care cert/re-cert    Problem List Patient Active Problem List   Diagnosis Date Noted  . Nonproliferative diabetic retinopathy of both eyes ([place]) 01/06/2020  . Degenerative retinal drusen of both eyes 01/06/2020  . Posterior vitreous detachment of left eye 01/06/2020  . Early stage nonexudative age-related macular degeneration of both eyes 01/06/2020  . Postoperative infection   . Abscess of right upper arm and forearm 09/15/2019  . Cellulitis of right upper extremity 09/14/2019  . Insulin-requiring or dependent type II diabetes mellitus ([place]) 09/14/2019  . Hyponatremia   . Right supracondylar humerus fracture, closed, initial encounter 08/20/2019  . Abnormal EKG 08/10/2016  . Hypertension 08/10/2016    [facility] 01/12/2020, 7:12 PM  [facility] [address] [place], [place], [address] Phone: [phone]   Fax:  [phone]  Name: ANEISA KARREN MRN: [id number] Date of Birth: 1941-02-13   [name], OTR/L [facility] [address] [place], [place]  [address] [phone] phone [phone] 01/12/20 7:12 PM

## 2020-01-12 NOTE — Therapy (Deleted)
[facility] [address] [place], [place], [address] Phone: [phone]   Fax:  [phone]  Occupational Therapy Evaluation  Patient Details  Name: Kayla Griffin MRN: [id number] Date of Birth: Jan 18, 1941 Referring Provider (OT): Dr. Smitty Knudsen   Encounter Date: 01/12/2020   OT End of Session - 01/12/20 1602    Visit Number 1    Number of Visits 13    Date for OT Re-Evaluation 04/11/20    Authorization Type [employer]    Authorization - Visit Number 1    Authorization - Number of Visits 10    Progress Note Due on Visit 10    OT Start Time 1102    OT Stop Time 1148    OT Time Calculation (min) 46 min    Activity Tolerance Patient tolerated treatment well    Behavior During Therapy [facility] for tasks assessed/performed           Past Medical History:  Diagnosis Date  . Ambulates with cane   . Arthritis   . Cancer [facility])    Endometrial - hysterectomy  . Depression   . Diabetes mellitus without complication ([place])    Type II  . Dyspnea    currently with arm pain 08/22/19  . Glaucoma   . HLD (hyperlipidemia)   . Hypertension   . Wears dentures     Past Surgical History:  Procedure Laterality Date  . ABDOMINAL HYSTERECTOMY    . CERVICAL FUSION  2007  . EYE SURGERY Bilateral    cataracts  . I & D EXTREMITY Right 09/15/2019   Procedure: IRRIGATION AND DEBRIDEMENT ELBOW;  Surgeon: Leandrew Koyanagi, MD;  Location: [place];  Service: Orthopedics;  Laterality: Right;  . IRRIGATION AND DEBRIDEMENT ELBOW Right 12/11/2019   Procedure: RIGHT ELBOW IRRIGATION AND DEBRIDEMENT;  Surgeon: Leandrew Koyanagi, MD;  Location: [place];  Service: Orthopedics;  Laterality: Right;  . ORIF HUMERUS FRACTURE Right 08/25/2019   Procedure: OPEN REDUCTION INTERNAL FIXATION (ORIF) RIGHT SUPRACONDYLAR HUMERUS FRACTURE, NEUROLYSIS ULNAR NERVE;  Surgeon: Leandrew Koyanagi, MD;  Location: [place];  Service: Orthopedics;  Laterality: Right;   . ORIF PATELLA Right 07/07/2016   Procedure: Partial Patellectomy;  Surgeon: Leandrew Koyanagi, MD;  Location: [place];  Service: Orthopedics;  Laterality: Right;    There were no vitals filed for this visit.   Subjective Assessment - 01/12/20 1113    Subjective  Pt reports recent fall Sunday am--fell off couch while sleeping and had to call 911 as she was unable to get up due to RUE (can't get on knees due to previous R knee injury), but no injury with this fall.    Patient Stated Goals be able to use arm more, be able to use my cane and not need walker (was using cane in R hand prior), be able to go upstairs again    Currently in Pain? Yes    Pain Score 4     Pain Orientation Right    Pain Descriptors / Indicators Aching    Pain Type Acute pain    Pain Onset More than a month ago    Pain Frequency Intermittent    Aggravating Factors  weather    Pain Relieving Factors Tylenol arthritis/pain meds at night             [facility] OT Assessment - 01/12/20 0001      Assessment   Medical Diagnosis ORIF R supracondylar humerus fx with  abscess of R upper arm/forearm s/p I&D x2     Referring Provider (OT) Dr. Smitty Knudsen    Onset Date/Surgical Date 08/19/19   original injury with I&D 09/14/19 and 12/18/19   Hand Dominance Right    Next MD Visit 01/29/20    Prior Therapy Therapy at [facility] after initial surgery      Precautions   Precautions Fall      Balance Screen   Has the patient fallen in the past 6 months Yes    How many times? 2    Has the patient had a decrease in activity level because of a fear of falling?  Yes      Home  Environment   Family/patient expects to be discharged to: Private residence    Home Layout Two level   unable to go upstairs   Bathroom Shower/Tub --   washing at sink, only 1/2 bath downstairs   Lives With Alone      Prior Function   Level of Independence Independent      ADL   Eating/Feeding --   using RUE only 50% of time   Grooming --   LUE assisting RUE  at times   Upper Body Bathing Modified independent    Lower Body Bathing Modified independent    Upper Body Dressing --   mod I   Lower Body Dressing Modified independent    Toilet Transfer Modified independent    Toileting - Clothing Manipulation Modified independent    [place] Transfer --   washing at sink currently   ADL comments pt reports BADLs mod I, but reports R 3rd digit flex intermittently with reaching.  Pt reports cooking/cleaning mod I.   Has RW with tray for inside use.  Pt reports that she sleeps on couch since injury due to inability to go upstairs.  Neighbor gets clothes from upstairs closet for her.  Can't lift gallon of milk.      IADL   Prior Level of Function Shopping independent    Shopping Takes care of all shopping needs independently    Prior Level of Function Light Housekeeping independent    Light Housekeeping --   mod I for light tasks   Prior Level of Function Meal Prep independent    Meal Prep --   mod I for light tasks   Prior Level of Function Passenger transport manager own vehicle    Prior Level of Function Financial Management independent    Physiological scientist financial matters independently (budgets, writes checks, pays rent, bills goes to bank), collects and keeps track of income      Mobility   Mobility Status History of falls    Mobility Status Comments ambulates with RW in the home (with tray) and one for in communiity.  Used cane prior to RUE fx.   unable to go upstairs     Written Expression   Dominant Hand Right    Handwriting --   mild difficulty holding pen, doesn't write much     Observation/Other Assessments   Skin Integrity small open area lateral R elbow from last surgery.  Pt hypersensitive to touch at original incision site dorsal elbow      Sensation   Additional Comments pt denies numbness/tingling.  Incision site/scar sensitive to touch       Coordination   Coordination pt denies difficulty      Edema  Edema mod edema noted at R elbow      ROM / Strength   AROM / PROM / Strength AROM;Strength      AROM   Overall AROM  Deficits    Overall AROM Comments R shoulder flex 90* (needed cueing as pt goes into abduction/scaption when trying to raise arm), abduction 105*, approx 50% ER, elbow flex 125* and -70* extension.  Supination/pronation WNL, wrist ext WNL but noted 3rd digit MP flex with wrist ext      Strength   Overall Strength Deficits    Overall Strength Comments R shoulder flex grossly 3+/5, elbow flex/ext grossly 3+/5   pain appears to limit     Hand Function   Right Hand Grip (lbs) 25    Left Hand Grip (lbs) 40.7                           OT Education - 01/12/20 1757    Education Details Pt instructed to begin light desentization techniques at dorsal elbow (clean hands, light rubbing/tapping with fingers, avoiding small open area); Discussed OT eval results/POC and recommendation for Physical therapy based on pt goals    Person(s) Educated Patient    Methods Explanation    Comprehension Verbalized understanding            OT Short Term Goals - 01/12/20 1840      OT SHORT TERM GOAL #1   Title Pt will be independent with initial HEP.--check STGs 02/23/20    Time 6    Period Weeks    Status New      OT SHORT TERM GOAL #2   Title Pt will demo at least -55* R elbow ext for functional reach.    Baseline -70*    Time 6    Period Weeks    Status New      OT SHORT TERM GOAL #3   Title Pt will demo at least 135* R elbow flexion for incr ease with using RUE to eat/care for hair.    Baseline 125*    Time 12    Period Weeks    Status New      OT SHORT TERM GOAL #4   Title Pt will demo at least 100* R shoulder flexion for functional reach    Baseline 90*    Time 6    Period Weeks    Status New      OT SHORT TERM GOAL #5   Title Pt will improve R grip strength by at least 5lbs to  assist with lifting/opening containers.    Time 6    Period Weeks    Status New      OT SHORT TERM GOAL #6   Title Pt will be independent with edema management and desensitization techniques.    Time 6    Period Weeks    Status New             OT Long Term Goals - 01/12/20 1844      OT LONG TERM GOAL #1   Title Pt will be independent with updated HEP for strengthening.--check LTGs 04/11/20    Time 12    Period Weeks    Status New      OT LONG TERM GOAL #2   Title Pt will demo at least -45* R elbow ext for functional reach    Baseline -70*    Time 12    Period Weeks  Status New      OT LONG TERM GOAL #3   Title Pt will improve RUE strength be able to lift gallon of milk at midlevel to place in cart or pour.    Time 12    Period Weeks    Status New      OT LONG TERM GOAL #4   Title Pt will improve R grip strength by at least 10lbs for lifting.    Baseline 25lbs    Time 12    Period Weeks    Status New      OT LONG TERM GOAL #5   Title Pt will demo at least 110* R shoulder flex for functional reaching and incr ease with hair care.    Baseline 90*    Time 12    Period Weeks    Status New      OT LONG TERM GOAL #6   Title Pt will be able to eat with dominant RUE 100% of the time.    Baseline 50%    Time 12    Period Weeks    Status New                 Plan - 01/12/20 1825    Clinical Impression Statement Pt is a 79 y.o. female with ORIF R supracondylar humerus fx and s/p I&D x2 (09/14/19 and 12/18/19) due to abcess and deep infection.  Pt reports that she has been on antibiotiics for 14 weeks (with 2.5 weeks left).  Pt with PMH that also includes:arthritis, endometrial CA, depression, Glaucoma, HLD, HTN, ORIF R patella.  Pt was independent prior to initial injury.  Pt is currently mod I, but unable to use dominant RUE for all tasks or return to using cane for ambulation (used in RUE previously).  Pt presents today with decr strength, decr ROM, edema,  hypersensitivity, decr functional mobility, and decr dominant RUE functional use.  Pt would benefit from occupational therapy to address these deficits for incr RUE functional use and ADL/IADL performance and safety.    OT Occupational Profile and History Detailed Assessment- Review of Records and additional review of physical, cognitive, psychosocial history related to current functional performance    Occupational performance deficits (Please refer to evaluation for details): ADL's;IADL's    Body Structure / Function / Physical Skills Balance;Mobility;Skin integrity;Strength;Edema;ADL;UE functional use;Pain;ROM;Sensation;IADL    Rehab Potential Fair    Clinical Decision Making Several treatment options, min-mod task modification necessary    Comorbidities Affecting Occupational Performance: May have comorbidities impacting occupational performance    Modification or Assistance to Complete Evaluation  Min-Moderate modification of tasks or assist with assess necessary to complete eval    OT Frequency 1x / week   at pt request due to financial concerns   OT Duration 12 weeks   +eval, may d/c earlier dependent on progress   OT Treatment/Interventions Therapeutic exercise;Functional Mobility Training;Self-care/ADL training;Ultrasound;Neuromuscular education;Manual Therapy;Splinting;Therapeutic activities;Cryotherapy;DME and/or AE instruction;Scar mobilization;Electrical Stimulation;Moist Heat;Contrast Bath;Passive range of motion;Patient/family education    Plan initiate HEP for AAROM/self PROM, grip strength    Recommended Other Services physical therapy due to hx of falls, and decr functional mobility (unable to go upstairs)    Consulted and Agree with Plan of Care Patient           Patient will benefit from skilled therapeutic intervention in order to improve the following deficits and impairments:   Body Structure / Function / Physical Skills: Balance, Mobility, Skin integrity, Strength, Edema,  ADL, UE  functional use, Pain, ROM, Sensation, IADL       Visit Diagnosis: Stiffness of right elbow, not elsewhere classified  Stiffness of right shoulder, not elsewhere classified  Other symptoms and signs involving the nervous system  Muscle weakness (generalized)  Localized edema  Unsteadiness on feet    Problem List Patient Active Problem List   Diagnosis Date Noted  . Nonproliferative diabetic retinopathy of both eyes ([place]) 01/06/2020  . Degenerative retinal drusen of both eyes 01/06/2020  . Posterior vitreous detachment of left eye 01/06/2020  . Early stage nonexudative age-related macular degeneration of both eyes 01/06/2020  . Postoperative infection   . Abscess of right upper arm and forearm 09/15/2019  . Cellulitis of right upper extremity 09/14/2019  . Insulin-requiring or dependent type II diabetes mellitus ([place]) 09/14/2019  . Hyponatremia   . Right supracondylar humerus fracture, closed, initial encounter 08/20/2019  . Abnormal EKG 08/10/2016  . Hypertension 08/10/2016    [facility] 01/12/2020, 6:55 PM  [facility] [address] [place], [place], [address] Phone: [phone]   Fax:  [phone]  Name: CHIANN GOFFREDO MRN: [id number] Date of Birth: 1940/10/08   [name], OTR/L [facility] [address] [place],   [address] [phone] phone [phone] 01/12/20 6:55 PM

## 2020-01-13 NOTE — Telephone Encounter (Signed)
Thank you for letting me know.  I've put in the referral.

## 2020-01-26 ENCOUNTER — Ambulatory Visit: Payer: Medicare HMO | Admitting: Occupational Therapy

## 2020-01-26 ENCOUNTER — Other Ambulatory Visit: Payer: Self-pay

## 2020-01-26 DIAGNOSIS — R29818 Other symptoms and signs involving the nervous system: Secondary | ICD-10-CM | POA: Diagnosis not present

## 2020-01-26 DIAGNOSIS — M25611 Stiffness of right shoulder, not elsewhere classified: Secondary | ICD-10-CM | POA: Diagnosis not present

## 2020-01-26 DIAGNOSIS — R600 Localized edema: Secondary | ICD-10-CM | POA: Diagnosis not present

## 2020-01-26 DIAGNOSIS — M25621 Stiffness of right elbow, not elsewhere classified: Secondary | ICD-10-CM

## 2020-01-26 DIAGNOSIS — R2681 Unsteadiness on feet: Secondary | ICD-10-CM | POA: Diagnosis not present

## 2020-01-26 DIAGNOSIS — M6281 Muscle weakness (generalized): Secondary | ICD-10-CM | POA: Diagnosis not present

## 2020-01-26 DIAGNOSIS — R293 Abnormal posture: Secondary | ICD-10-CM | POA: Diagnosis not present

## 2020-01-26 DIAGNOSIS — R2689 Other abnormalities of gait and mobility: Secondary | ICD-10-CM | POA: Diagnosis not present

## 2020-01-26 NOTE — Patient Instructions (Signed)
Flexion (Assistive)    Clasp hands together and raise arms above head, keeping elbows as straight as possible. Can be done sitting or lying. Repeat __10__ times. Do _2___ sessions per day.  ELBOW: Extension / Chest Press (Frame)    Lie on back with knees bent. Straighten elbows to raise dowel/broomstick. Hold _5__ seconds. 10__ reps per set, _2__ sets per day  Elbow Extension: Resisted    Lie on back, right arm up, elbow bent and supported. Straighten elbow. Return slowly. Do NOT use weight.  Repeat _10___ times per set.  Do __2__ sessions per day.  Flexion (Passive)    Sitting upright with folded towel under hands, slide forearms forward along table, bending from the waist until a stretch is felt. Hold __5__ seconds. Repeat __10__ times. Do __2__ sessions per day.  Extension (Passive)    Place thick telephone book or pillows on table and rest upper arm on it. Grasp forearm with other hand and use a steady downward and outward pull to straighten elbow. Hold _10___ seconds. Repeat __5__ times. Do __2__ sessions per day.   1. Grip Strengthening (Resistive Putty)   Squeeze putty using thumb and all fingers. Repeat _20___ times. Do __2__ sessions per day.   2. Roll putty into tube on table and pinch between first two fingers and thumb x 10 reps. Do 2 sessions per day

## 2020-01-26 NOTE — Therapy (Signed)
[facility] [address] [place], [place], [address] Phone: [phone]   Fax:  [phone]  Occupational Therapy Treatment  Patient Details  Name: Kayla Griffin MRN: [id number] Date of Birth: Mar 10, 1941 Referring Provider (OT): Dr. Frankey Shown   Encounter Date: 01/26/2020   OT End of Session - 01/26/20 1415    Visit Number 2    Number of Visits 13    Date for OT Re-Evaluation 04/11/20    Authorization Type [employer] Medicare    Authorization - Visit Number 2    Authorization - Number of Visits 10    Progress Note Due on Visit 10    OT Start Time 0930    OT Stop Time 1015    OT Time Calculation (min) 45 min    Activity Tolerance Patient tolerated treatment well    Behavior During Therapy [facility] for tasks assessed/performed           Past Medical History:  Diagnosis Date  . Ambulates with cane   . Arthritis   . Cancer [facility])    Endometrial - hysterectomy  . Depression   . Diabetes mellitus without complication ([place])    Type II  . Dyspnea    currently with arm pain 08/22/19  . Glaucoma   . HLD (hyperlipidemia)   . Hypertension   . Wears dentures     Past Surgical History:  Procedure Laterality Date  . ABDOMINAL HYSTERECTOMY    . CERVICAL FUSION  2007  . EYE SURGERY Bilateral    cataracts  . I & D EXTREMITY Right 09/15/2019   Procedure: IRRIGATION AND DEBRIDEMENT ELBOW;  Surgeon: Leandrew Koyanagi, MD;  Location: [place];  Service: Orthopedics;  Laterality: Right;  . IRRIGATION AND DEBRIDEMENT ELBOW Right 12/11/2019   Procedure: RIGHT ELBOW IRRIGATION AND DEBRIDEMENT;  Surgeon: Leandrew Koyanagi, MD;  Location: [place];  Service: Orthopedics;  Laterality: Right;  . ORIF HUMERUS FRACTURE Right 08/25/2019   Procedure: OPEN REDUCTION INTERNAL FIXATION (ORIF) RIGHT SUPRACONDYLAR HUMERUS FRACTURE, NEUROLYSIS ULNAR NERVE;  Surgeon: Leandrew Koyanagi, MD;  Location: [place];  Service: Orthopedics;  Laterality:  Right;  . ORIF PATELLA Right 07/07/2016   Procedure: Partial Patellectomy;  Surgeon: Leandrew Koyanagi, MD;  Location: [place];  Service: Orthopedics;  Laterality: Right;    There were no vitals filed for this visit.   Subjective Assessment - 01/26/20 0942    Subjective  I haven't had anymore falls. The doctor said he may go in again to remove hardware. I see him again Thursday    Pertinent History ORIF Rt supracondylar humerus fx 08/19/19, I & D 09/14/19 and 12/18/19.    Patient Stated Goals be able to use arm more, be able to use my cane and not need walker (was using cane in R hand prior), be able to go upstairs again    Currently in Pain? Yes    Pain Score 0-No pain   up to 4/10   Pain Location Elbow    Pain Orientation Right    Pain Descriptors / Indicators Aching    Pain Type Acute pain    Pain Onset More than a month ago    Pain Frequency Intermittent    Aggravating Factors  weather    Pain Relieving Factors pain meds           Pt issued HEP for A/ROM, AA/ROM, and P/ROM for elbow and putty HEP for grip and pinch strength - see  pt instructions for details. Pt performed each as indicated.                      OT Education - 01/26/20 1010    Education Details Elbow ROM HEP, putty HEP    Person(s) Educated Patient    Methods Explanation;Demonstration;Handout    Comprehension Verbalized understanding;Returned demonstration            OT Short Term Goals - 01/26/20 1415      OT SHORT TERM GOAL #1   Title Pt will be independent with initial HEP.--check STGs 02/23/20    Time 6    Period Weeks    Status On-going      OT SHORT TERM GOAL #2   Title Pt will demo at least -55* R elbow ext for functional reach.    Baseline -70*    Time 6    Period Weeks    Status On-going      OT SHORT TERM GOAL #3   Title Pt will demo at least 135* R elbow flexion for incr ease with using RUE to eat/care for hair.    Baseline 125*    Time 12    Period Weeks    Status On-going       OT SHORT TERM GOAL #4   Title Pt will demo at least 100* R shoulder flexion for functional reach    Baseline 90*    Time 6    Period Weeks    Status On-going      OT SHORT TERM GOAL #5   Title Pt will improve R grip strength by at least 5lbs to assist with lifting/opening containers.    Time 6    Period Weeks    Status New      OT SHORT TERM GOAL #6   Title Pt will be independent with edema management and desensitization techniques.    Time 6    Period Weeks    Status New             OT Long Term Goals - 01/12/20 1844      OT LONG TERM GOAL #1   Title Pt will be independent with updated HEP for strengthening.--check LTGs 04/11/20    Time 12    Period Weeks    Status New      OT LONG TERM GOAL #2   Title Pt will demo at least -45* R elbow ext for functional reach    Baseline -70*    Time 12    Period Weeks    Status New      OT LONG TERM GOAL #3   Title Pt will improve RUE strength be able to lift gallon of milk at midlevel to place in cart or pour.    Time 12    Period Weeks    Status New      OT LONG TERM GOAL #4   Title Pt will improve R grip strength by at least 10lbs for lifting.    Baseline 25lbs    Time 12    Period Weeks    Status New      OT LONG TERM GOAL #5   Title Pt will demo at least 110* R shoulder flex for functional reaching and incr ease with hair care.    Baseline 90*    Time 12    Period Weeks    Status New      OT LONG TERM GOAL #6  Title Pt will be able to eat with dominant RUE 100% of the time.    Baseline 50%    Time 12    Period Weeks    Status New                 Plan - 01/26/20 1417    Clinical Impression Statement Pt limited with elbow extension. Pt sees MD this week for follow up    OT Occupational Profile and History Detailed Assessment- Review of Records and additional review of physical, cognitive, psychosocial history related to current functional performance    Occupational performance deficits (Please  refer to evaluation for details): ADL's;IADL's    Body Structure / Function / Physical Skills Balance;Mobility;Skin integrity;Strength;Edema;ADL;UE functional use;Pain;ROM;Sensation;IADL    Rehab Potential Fair    Clinical Decision Making Several treatment options, min-mod task modification necessary    Comorbidities Affecting Occupational Performance: May have comorbidities impacting occupational performance    Modification or Assistance to Complete Evaluation  Min-Moderate modification of tasks or assist with assess necessary to complete eval    OT Frequency 1x / week   at pt request due to financial concerns   OT Duration 12 weeks   +eval, may d/c earlier dependent on progress   OT Treatment/Interventions Therapeutic exercise;Functional Mobility Training;Self-care/ADL training;Ultrasound;Neuromuscular education;Manual Therapy;Splinting;Therapeutic activities;Cryotherapy;DME and/or AE instruction;Scar mobilization;Electrical Stimulation;Moist Heat;Contrast Bath;Passive range of motion;Patient/family education    Plan review HEP, weighted stretches, UBE, try buddy strap (long finger to index finger) and possibly estim?    Recommended Other Services physical therapy due to hx of falls, and decr functional mobility (unable to go upstairs)    Consulted and Agree with Plan of Care Patient           Patient will benefit from skilled therapeutic intervention in order to improve the following deficits and impairments:   Body Structure / Function / Physical Skills: Balance, Mobility, Skin integrity, Strength, Edema, ADL, UE functional use, Pain, ROM, Sensation, IADL       Visit Diagnosis: Stiffness of right elbow, not elsewhere classified  Stiffness of right shoulder, not elsewhere classified    Problem List Patient Active Problem List   Diagnosis Date Noted  . Nonproliferative diabetic retinopathy of both eyes ([place]) 01/06/2020  . Degenerative retinal drusen of both eyes 01/06/2020  .  Posterior vitreous detachment of left eye 01/06/2020  . Early stage nonexudative age-related macular degeneration of both eyes 01/06/2020  . Postoperative infection   . Abscess of right upper arm and forearm 09/15/2019  . Cellulitis of right upper extremity 09/14/2019  . Insulin-requiring or dependent type II diabetes mellitus ([place]) 09/14/2019  . Hyponatremia   . Right supracondylar humerus fracture, closed, initial encounter 08/20/2019  . Abnormal EKG 08/10/2016  . Hypertension 08/10/2016    Carey Bullocks, OTR/L 01/26/2020, 2:21 PM  [facility] [address] [place], [place], [address] Phone: [phone]   Fax:  [phone]  Name: LINZY LAURY MRN: [id number] Date of Birth: Dec 11, 1940

## 2020-01-27 DIAGNOSIS — M810 Age-related osteoporosis without current pathological fracture: Secondary | ICD-10-CM | POA: Diagnosis not present

## 2020-01-27 DIAGNOSIS — I10 Essential (primary) hypertension: Secondary | ICD-10-CM | POA: Diagnosis not present

## 2020-01-27 DIAGNOSIS — E113293 Type 2 diabetes mellitus with mild nonproliferative diabetic retinopathy without macular edema, bilateral: Secondary | ICD-10-CM | POA: Diagnosis not present

## 2020-01-27 DIAGNOSIS — Z23 Encounter for immunization: Secondary | ICD-10-CM | POA: Diagnosis not present

## 2020-01-27 DIAGNOSIS — Z794 Long term (current) use of insulin: Secondary | ICD-10-CM | POA: Diagnosis not present

## 2020-01-29 ENCOUNTER — Other Ambulatory Visit: Payer: Self-pay

## 2020-01-29 ENCOUNTER — Ambulatory Visit (INDEPENDENT_AMBULATORY_CARE_PROVIDER_SITE_OTHER): Payer: Medicare HMO | Admitting: Orthopaedic Surgery

## 2020-01-29 ENCOUNTER — Encounter: Payer: Self-pay | Admitting: Orthopaedic Surgery

## 2020-01-29 DIAGNOSIS — S42411A Displaced simple supracondylar fracture without intercondylar fracture of right humerus, initial encounter for closed fracture: Secondary | ICD-10-CM

## 2020-01-29 MED ORDER — HYDROCODONE-ACETAMINOPHEN 5-325 MG PO TABS
1.0000 | ORAL_TABLET | Freq: Every day | ORAL | 0 refills | Status: DC | PRN
Start: 2020-01-29 — End: 2020-07-07

## 2020-01-29 NOTE — Progress Notes (Signed)
Post-Op Visit Note   Patient: Kayla Griffin           Date of Birth: 03/21/1941           MRN: [id number] Visit Date: 01/29/2020 PCP: Aretta Nip, MD   Assessment & Plan:  Chief Complaint:  Chief Complaint  Patient presents with  . Right Elbow - Pain   Visit Diagnoses:  1. Right supracondylar humerus fracture, closed, initial encounter     Plan: Dhruti is s/p right elbow I&D.  She comes in for wound check.  Overall doing okay.  The wound has healed up.  She completes her doxycycline course tomorrow.  No real complaints.  At this point we will see her back in another 3 weeks with repeat two-view x-rays of the right elbow.  Again we discussed that there is a possibility that she may require complete hardware removal to eradicate the infection if antibiotics fail to do so.  Follow-Up Instructions: Return in about 3 weeks (around 02/19/2020).   Orders:  No orders of the defined types were placed in this encounter.  Meds ordered this encounter  Medications  . HYDROcodone-acetaminophen (NORCO) 5-325 MG tablet    Sig: Take 1-2 tablets by mouth daily as needed.    Dispense:  30 tablet    Refill:  0    Imaging: No results found.  PMFS History: Patient Active Problem List   Diagnosis Date Noted  . Nonproliferative diabetic retinopathy of both eyes ([place]) 01/06/2020  . Degenerative retinal drusen of both eyes 01/06/2020  . Posterior vitreous detachment of left eye 01/06/2020  . Early stage nonexudative age-related macular degeneration of both eyes 01/06/2020  . Postoperative infection   . Abscess of right upper arm and forearm 09/15/2019  . Cellulitis of right upper extremity 09/14/2019  . Insulin-requiring or dependent type II diabetes mellitus ([place]) 09/14/2019  . Hyponatremia   . Right supracondylar humerus fracture, closed, initial encounter 08/20/2019  . Abnormal EKG 08/10/2016  . Hypertension 08/10/2016   Past Medical History:  Diagnosis Date  .  Ambulates with cane   . Arthritis   . Cancer [facility])    Endometrial - hysterectomy  . Depression   . Diabetes mellitus without complication ([place])    Type II  . Dyspnea    currently with arm pain 08/22/19  . Glaucoma   . HLD (hyperlipidemia)   . Hypertension   . Wears dentures     Family History  Problem Relation Age of Onset  . CVA Mother   . Heart disease Father 58  . Heart attack Sister   . Heart disease Sister   . Arrhythmia Sister   . CVA Sister     Past Surgical History:  Procedure Laterality Date  . ABDOMINAL HYSTERECTOMY    . CERVICAL FUSION  2007  . EYE SURGERY Bilateral    cataracts  . I & D EXTREMITY Right 09/15/2019   Procedure: IRRIGATION AND DEBRIDEMENT ELBOW;  Surgeon: Leandrew Koyanagi, MD;  Location: [place];  Service: Orthopedics;  Laterality: Right;  . IRRIGATION AND DEBRIDEMENT ELBOW Right 12/11/2019   Procedure: RIGHT ELBOW IRRIGATION AND DEBRIDEMENT;  Surgeon: Leandrew Koyanagi, MD;  Location: [place];  Service: Orthopedics;  Laterality: Right;  . ORIF HUMERUS FRACTURE Right 08/25/2019   Procedure: OPEN REDUCTION INTERNAL FIXATION (ORIF) RIGHT SUPRACONDYLAR HUMERUS FRACTURE, NEUROLYSIS ULNAR NERVE;  Surgeon: Leandrew Koyanagi, MD;  Location: [place];  Service: Orthopedics;  Laterality: Right;  . ORIF PATELLA  Right 07/07/2016   Procedure: Partial Patellectomy;  Surgeon: Leandrew Koyanagi, MD;  Location: [place];  Service: Orthopedics;  Laterality: Right;   Social History   Occupational History  . Not on file  Tobacco Use  . Smoking status: Never Smoker  . Smokeless tobacco: Never Used  Vaping Use  . Vaping Use: Never used  Substance and Sexual Activity  . Alcohol use: No  . Drug use: No  . Sexual activity: Not Currently    Birth control/protection: Surgical    Comment: Hysterectomy

## 2020-02-05 ENCOUNTER — Ambulatory Visit: Payer: Medicare HMO

## 2020-02-05 ENCOUNTER — Ambulatory Visit: Payer: Medicare HMO | Admitting: Occupational Therapy

## 2020-02-05 ENCOUNTER — Other Ambulatory Visit: Payer: Self-pay

## 2020-02-05 ENCOUNTER — Encounter: Payer: Self-pay | Admitting: Occupational Therapy

## 2020-02-05 DIAGNOSIS — M6281 Muscle weakness (generalized): Secondary | ICD-10-CM | POA: Diagnosis not present

## 2020-02-05 DIAGNOSIS — R2681 Unsteadiness on feet: Secondary | ICD-10-CM | POA: Diagnosis not present

## 2020-02-05 DIAGNOSIS — R600 Localized edema: Secondary | ICD-10-CM

## 2020-02-05 DIAGNOSIS — M25611 Stiffness of right shoulder, not elsewhere classified: Secondary | ICD-10-CM

## 2020-02-05 DIAGNOSIS — R2689 Other abnormalities of gait and mobility: Secondary | ICD-10-CM

## 2020-02-05 DIAGNOSIS — R29818 Other symptoms and signs involving the nervous system: Secondary | ICD-10-CM | POA: Diagnosis not present

## 2020-02-05 DIAGNOSIS — R293 Abnormal posture: Secondary | ICD-10-CM

## 2020-02-05 DIAGNOSIS — M25621 Stiffness of right elbow, not elsewhere classified: Secondary | ICD-10-CM | POA: Diagnosis not present

## 2020-02-05 NOTE — Therapy (Signed)
[facility] [address] [place], [place], [address] Phone: [phone]   Fax:  [phone]  Physical Therapy Evaluation  Patient Details  Name: Kayla Griffin MRN: [id number] Date of Birth: [birth year] Referring Provider (PT): Dr. Erlinda Hong   Encounter Date: 02/05/2020   PT End of Session - 02/05/20 1317    Visit Number 1    Number of Visits 9   pt not able to afford twice a week   Date for PT Re-Evaluation 05/05/20    Authorization Type [employer] (PN every 10th visit)    PT Start Time 1149    PT Stop Time 1230    PT Time Calculation (min) 41 min    Equipment Utilized During Treatment Other (comment)   min guard to S prn   Activity Tolerance Patient tolerated treatment well    Behavior During Therapy [facility] for tasks assessed/performed           Past Medical History:  Diagnosis Date  . Ambulates with cane   . Arthritis   . Cancer [facility])    Endometrial - hysterectomy  . Depression   . Diabetes mellitus without complication ([place])    Type II  . Dyspnea    currently with arm pain 08/22/19  . Glaucoma   . HLD (hyperlipidemia)   . Hypertension   . Wears dentures     Past Surgical History:  Procedure Laterality Date  . ABDOMINAL HYSTERECTOMY    . CERVICAL FUSION  2007  . EYE SURGERY Bilateral    cataracts  . I & D EXTREMITY Right 09/15/2019   Procedure: IRRIGATION AND DEBRIDEMENT ELBOW;  Surgeon: Leandrew Koyanagi, MD;  Location: [place];  Service: Orthopedics;  Laterality: Right;  . IRRIGATION AND DEBRIDEMENT ELBOW Right 12/11/2019   Procedure: RIGHT ELBOW IRRIGATION AND DEBRIDEMENT;  Surgeon: Leandrew Koyanagi, MD;  Location: [place];  Service: Orthopedics;  Laterality: Right;  . ORIF HUMERUS FRACTURE Right 08/25/2019   Procedure: OPEN REDUCTION INTERNAL FIXATION (ORIF) RIGHT SUPRACONDYLAR HUMERUS FRACTURE, NEUROLYSIS ULNAR NERVE;  Surgeon: Leandrew Koyanagi, MD;  Location: [place];  Service: Orthopedics;   Laterality: Right;  . ORIF PATELLA Right 07/07/2016   Procedure: Partial Patellectomy;  Surgeon: Leandrew Koyanagi, MD;  Location: [place];  Service: Orthopedics;  Laterality: Right;    There were no vitals filed for this visit.    Subjective Assessment - 02/05/20 1157    Subjective Pt presenting s/p R humerus fx (ORIF-08/25/19, then several I&D for infection) and infection and fall, pt was in SNF for rehab for 3.5 weeks after acute care. Pt amb. with SPC prior to fall and now utilizes a RW 2/2 impaired balance. Pt seeing OT for RUE. Pt has to go backwards on stairs at home, based on HHPT recommendations from previous knee injury. Pt's bathroom (full bath) is upstairs and pt is living downstairs. Pt has to hold on counter as RW doesn't fit in RW.    Pertinent History ORIF humerus 08/2019 with I&D's for infection, HTN, DM, glaucoma, macular degeneration, diabetic retinopathy, arthritis, depression, HLD, hx of endometrial CA, abscess R RU, virteous detachment (L side)    Patient Stated Goals Be able to use SPC vs. RW, go up stairs    Currently in Pain? Yes    Pain Score 2     Pain Location Elbow    Pain Orientation Right    Pain Descriptors / Indicators Aching    Pain Type Chronic pain  Pain Onset More than a month ago    Aggravating Factors  weather changes    Pain Relieving Factors pain medications              [facility] PT Assessment - 02/05/20 1206      Assessment   Medical Diagnosis Muscle weakness, unsteadiness     Referring Provider (PT) Dr. Erlinda Hong    Onset Date/Surgical Date 08/19/19    Hand Dominance Right    Prior Therapy Therapy at [facility] after initial surgery and OP OT      Precautions   Precautions Fall      Restrictions   Weight Bearing Restrictions No      Balance Screen   Has the patient fallen in the past 6 months No    Has the patient had a decrease in activity level because of a fear of falling?  Yes    Is the patient reluctant to leave their home because of a fear  of falling?  Yes      [place] Private residence    Living Arrangements Alone    Available Help at Discharge Neighbor;Available PRN/intermittently    Type of [place] Level entry    [place] Two level;1/2 bath on main level;Bed/bath upstairs    Alternate Level Stairs-Number of Steps 15    Alternate Level Stairs-Rails Left;Right   Left entire staircase, R for 6 steps only   [place] - 2 wheels;Cane - single point;Bedside commode   shower chair is in upstairs bathroom     Prior Function   Level of Independence Independent    Vocation Retired    Corporate investment banker, travel, shopping      Cognition   Overall Cognitive Status Within Functional Limits for tasks assessed      Sensation   Light Touch Impaired by gross assessment    Additional Comments Pt reported N/T in LLE and decr. light touch. PT noted pt's L ankle swollen and pt reported it's been intermittently swollen the last few weeks.       Coordination   Gross Motor Movements are Fluid and Coordinated Yes    Fine Motor Movements are Fluid and Coordinated Yes    Heel Shin Test WNL      Posture/Postural Control   Posture/Postural Control Postural limitations    Postural Limitations Rounded Shoulders;Forward head;Increased thoracic kyphosis;Right pelvic obliquity      ROM / Strength   AROM / PROM / Strength AROM;Strength      AROM   Overall AROM  Deficits    Overall AROM Comments Decr. B ankle dorisflexion during gait (approx. neutral)      Strength   Overall Strength Deficits    Overall Strength Comments RLE: hip flex: 3+/5, knee ext: 3+/5, knee flex: 4-/5, ankle DF: 2/5. LLE: hip flex: 4-/5, knee ext: 4-5, knee flex: 4-/5, ankle DF: 2/5. B hip abd/add: 4/5 in seated position      Transfers   Transfers Sit to Stand;Stand to Sit    Sit to Stand 5: Supervision;From chair/3-in-1;With armrests    Stand to Sit 5: Supervision;With upper extremity assist;To  chair/3-in-1    Comments S for safety.      Ambulation/Gait   Ambulation/Gait Yes    Ambulation/Gait Assistance 5: Supervision    Ambulation/Gait Assistance Details for safety with RW    Ambulation Distance (Feet) 100 Feet    Assistive device Rolling walker  Gait Pattern Step-through pattern;Decreased stride length;Decreased dorsiflexion - right;Decreased dorsiflexion - left    Ambulation Surface Level;Indoor    Gait velocity 1.27ft/sec and 1.70ft/sec. with RW      Balance   Balance Assessed Yes      Static Standing Balance   Static Standing - Balance Support No upper extremity supported    Static Standing - Level of Assistance 5: Stand by assistance    Static Standing - Comment/# of Minutes Pt able to stand with feet together with EO and EC for 10 sec. with S for safety, incr. postural sway noted with feet together and UE support required to maintain balance after 5 sec.                      Objective measurements completed on examination: See above findings.               PT Education - 02/05/20 1315    Education Details Pt educated pt on outcome measure results and PT POC, duration, and frequency.    Person(s) Educated Patient    Methods Explanation    Comprehension Verbalized understanding            PT Short Term Goals - 02/05/20 1325      PT SHORT TERM GOAL #1   Title Pt will be IND with HEP to improve balance, strength, and endurance. TARGET DATE FOR ALL STGS: 03/04/20    Time 4    Period Weeks    Status New      PT SHORT TERM GOAL #2   Title Perform BERG and write STG/LTGs as indicated.    Time 4    Period Weeks    Status New      PT SHORT TERM GOAL #3   Title Pt will amb. 200' over even terrain with SPC at MOD I level to negotiate all areas of home safely.    Time 4    Period Weeks    Status New      PT SHORT TERM GOAL #4   Title Pt will improve gait speed to >/=2.41ft/sec. with LRAD to improve functional mobility and gait  efficiency.    Baseline 1.48ft/sec and 1.70ft/sec with RW    Time 4    Period Weeks    Status New             PT Long Term Goals - 02/05/20 1330      PT LONG TERM GOAL #1   Title Pt will improve TUG time with LRAD to 13.5sec to decr. falls risk.    Baseline 18.18 sec c RW    Time 8    Period Weeks    Status New      PT LONG TERM GOAL #2   Title Pt will improve gait speed to >/=2.48ft/sec with LRAD to safely amb. in the community.    Baseline 1.78ft/sec and 1.29ft/sec c RW    Time 8    Period Weeks    Status New      PT LONG TERM GOAL #3   Title Pt will amb. 500' over even and paved surfaces with LRAD to improve functional mobility.    Time 8    Period Weeks    Status New      PT LONG TERM GOAL #4   Title Perform DGI if applicable and write goal.    Time 8    Period Weeks    Status New  PT LONG TERM GOAL #5   Title Pt will traverse 16 steps with one handrail and SPC at MOD I level to safely traverse stairs at home.    Time 8    Period Weeks    Status New                  Plan - 02/05/20 1318    Clinical Impression Statement Pt is a pleasant 79y/o female presenting to OPP[facility] neuro for muscle weakness and unsteadiness s/p 08/2019 ORIF R humerus and infections. Pt's PMH is significant for the following: ORIF humerus 08/2019 with I&D's for infection, HTN, DM, glaucoma, macular degeneration, diabetic retinopathy, arthritis, depression, HLD, hx of endometrial CA, abscess R RU, virteous detachment (L side). Pt's gait speed is right at the cut-off for being a falls risk (1.18ft/sec and 1.90ft/sec with RW). Pt's TUG time indicated pt is at risk for falls. PT will formally assess balance with BERG next session. The following impairments were noted up exam: gait deviations, impaired balance, decr. endurance, decr. strength, postural dysfunction, edema, and impaired sensation. PT will monitor RUE pain duirng sessions but will not directly address pain. Pt would benefit from  skilled PT to improve safety during functional mobility and reduce falls risk.    Personal Factors and Comorbidities Age;Comorbidity 3+;Finances    Comorbidities ORIF humerus 08/2019 with I&D's for infection, HTN, DM, glaucoma, macular degeneration, diabetic retinopathy, arthritis, depression, HLD, hx of endometrial CA, abscess R RU, virteous detachment (L side)    Examination-Activity Limitations Carry;Stand;Stairs;Squat;Transfers;Locomotion Level    Examination-Participation Restrictions Driving;Meal Prep;Cleaning;Laundry;Community Activity    Stability/Clinical Decision Making Evolving/Moderate complexity    Clinical Decision Making Moderate    Rehab Potential Good    PT Frequency 1x / week   pt not able to afford twice a week   PT Duration 8 weeks    PT Treatment/Interventions ADLs/Self Care Home Management;Aquatic Therapy;Biofeedback;Canalith Repostioning;Balance training;Therapeutic exercise;Manual techniques;Vestibular;Therapeutic activities;Functional mobility training;Orthotic Fit/Training;Stair training;Gait training;DME Instruction;Patient/family education;Neuromuscular re-education    PT Next Visit Plan Perform BERG and write goals. Initiate OTAGO HEP.    Consulted and Agree with Plan of Care Patient           Patient will benefit from skilled therapeutic intervention in order to improve the following deficits and impairments:  Abnormal gait, Decreased endurance, Impaired sensation, Decreased knowledge of use of DME, Decreased balance, Decreased mobility, Decreased strength, Impaired UE functional use, Increased edema, Decreased range of motion, Impaired flexibility, Postural dysfunction  Visit Diagnosis: Other abnormalities of gait and mobility - Plan: PT plan of care cert/re-cert  Muscle weakness (generalized) - Plan: PT plan of care cert/re-cert  Unsteadiness on feet - Plan: PT plan of care cert/re-cert  Abnormal posture - Plan: PT plan of care cert/re-cert     Problem  List Patient Active Problem List   Diagnosis Date Noted  . Nonproliferative diabetic retinopathy of both eyes ([place]) 01/06/2020  . Degenerative retinal drusen of both eyes 01/06/2020  . Posterior vitreous detachment of left eye 01/06/2020  . Early stage nonexudative age-related macular degeneration of both eyes 01/06/2020  . Postoperative infection   . Abscess of right upper arm and forearm 09/15/2019  . Cellulitis of right upper extremity 09/14/2019  . Insulin-requiring or dependent type II diabetes mellitus ([place]) 09/14/2019  . Hyponatremia   . Right supracondylar humerus fracture, closed, initial encounter 08/20/2019  . Abnormal EKG 08/10/2016  . Hypertension 08/10/2016    Juni Glaab L 02/05/2020, 1:43 PM  [facility]  [address] [place], [place], [address] Phone: [phone]   Fax:  [phone]  Name: Kayla Griffin MRN: [id number] Date of Birth: 1941-03-16  Geoffry Paradise, PT,DPT 02/05/20 1:43 PM Phone: [phone] Fax: [phone]

## 2020-02-05 NOTE — Therapy (Signed)
[facility] [address] [place], [place], [address] Phone: [phone]   Fax:  [phone]  Occupational Therapy Treatment  Patient Details  Name: Kayla Griffin MRN: [id number] Date of Birth: 1940-06-12 Referring Provider (OT): Dr. Frankey Shown   Encounter Date: 02/05/2020   OT End of Session - 02/05/20 1312    Visit Number 3    Number of Visits 13    Date for OT Re-Evaluation 04/11/20    Authorization Type [employer]    Authorization - Visit Number 3    Authorization - Number of Visits 10    Progress Note Due on Visit 10    OT Start Time 1238    OT Stop Time 1316    OT Time Calculation (min) 38 min    Activity Tolerance Patient tolerated treatment well    Behavior During Therapy [facility] for tasks assessed/performed           Past Medical History:  Diagnosis Date  . Ambulates with cane   . Arthritis   . Cancer [facility])    Endometrial - hysterectomy  . Depression   . Diabetes mellitus without complication ([place])    Type II  . Dyspnea    currently with arm pain 08/22/19  . Glaucoma   . HLD (hyperlipidemia)   . Hypertension   . Wears dentures     Past Surgical History:  Procedure Laterality Date  . ABDOMINAL HYSTERECTOMY    . CERVICAL FUSION  2007  . EYE SURGERY Bilateral    cataracts  . I & D EXTREMITY Right 09/15/2019   Procedure: IRRIGATION AND DEBRIDEMENT ELBOW;  Surgeon: Leandrew Koyanagi, MD;  Location: [place];  Service: Orthopedics;  Laterality: Right;  . IRRIGATION AND DEBRIDEMENT ELBOW Right 12/11/2019   Procedure: RIGHT ELBOW IRRIGATION AND DEBRIDEMENT;  Surgeon: Leandrew Koyanagi, MD;  Location: [place];  Service: Orthopedics;  Laterality: Right;  . ORIF HUMERUS FRACTURE Right 08/25/2019   Procedure: OPEN REDUCTION INTERNAL FIXATION (ORIF) RIGHT SUPRACONDYLAR HUMERUS FRACTURE, NEUROLYSIS ULNAR NERVE;  Surgeon: Leandrew Koyanagi, MD;  Location: [place];  Service: Orthopedics;  Laterality:  Right;  . ORIF PATELLA Right 07/07/2016   Procedure: Partial Patellectomy;  Surgeon: Leandrew Koyanagi, MD;  Location: [place];  Service: Orthopedics;  Laterality: Right;    There were no vitals filed for this visit.   Subjective Assessment - 02/05/20 1244    Subjective  Pt reports MD follow up in 2 more weeks (3 weeks from last appt) and MD will take repeat x-rays, but if infection returns, pt will need surgery to remove hardware.    Pertinent History ORIF Rt supracondylar humerus fx 08/19/19, I & D 09/14/19 and 12/18/19.    Patient Stated Goals be able to use arm more, be able to use my cane and not need walker (was using cane in R hand prior), be able to go upstairs again    Currently in Pain? Yes    Pain Score 2     Pain Location Elbow    Pain Orientation Right    Pain Descriptors / Indicators Aching    Pain Onset More than a month ago    Pain Frequency Intermittent    Aggravating Factors  weather changes    Pain Relieving Factors pain medications           Pt has been taping, but having difficulty due to cleaning tasks (taking on/off so much); therefore issued Buddy strap for  digits 2-3 and instructed pt in use.  Pt verbalized understanding.  Reviewed initial HEP--pt returned demo each.  Supine, elbow flex weighted stretch with 2lb wt.  Sitting weighted elbow ext stretch with 2lb wt.    Arm bike x41min level 1 for conditioning and elbow flex/ext ROM without rest, positioned for incr elbow ext as able.  Reviewed gentle desensitization techniques and recommended pt begin gentle scar massage as pt's incision site is closed.  Also reviewed signs and symptoms of infection.  R elbow continues to be slightly warm to the touch, mild redness, and swelling; however, pt reports no change (MD appt last week and follow up in 2 more weeks).  Instructed to contact MD if symptoms worsen.  Pt verbalized understanding.  Swelling and temperature improved by end of session after exercise.       OT Short  Term Goals - 01/26/20 1415      OT SHORT TERM GOAL #1   Title Pt will be independent with initial HEP.--check STGs 02/23/20    Time 6    Period Weeks    Status On-going      OT SHORT TERM GOAL #2   Title Pt will demo at least -55* R elbow ext for functional reach.    Baseline -70*    Time 6    Period Weeks    Status On-going      OT SHORT TERM GOAL #3   Title Pt will demo at least 135* R elbow flexion for incr ease with using RUE to eat/care for hair.    Baseline 125*    Time 12    Period Weeks    Status On-going      OT SHORT TERM GOAL #4   Title Pt will demo at least 100* R shoulder flexion for functional reach    Baseline 90*    Time 6    Period Weeks    Status On-going      OT SHORT TERM GOAL #5   Title Pt will improve R grip strength by at least 5lbs to assist with lifting/opening containers.    Time 6    Period Weeks    Status New      OT SHORT TERM GOAL #6   Title Pt will be independent with edema management and desensitization techniques.    Time 6    Period Weeks    Status New             OT Long Term Goals - 01/12/20 1844      OT LONG TERM GOAL #1   Title Pt will be independent with updated HEP for strengthening.--check LTGs 04/11/20    Time 12    Period Weeks    Status New      OT LONG TERM GOAL #2   Title Pt will demo at least -45* R elbow ext for functional reach    Baseline -70*    Time 12    Period Weeks    Status New      OT LONG TERM GOAL #3   Title Pt will improve RUE strength be able to lift gallon of milk at midlevel to place in cart or pour.    Time 12    Period Weeks    Status New      OT LONG TERM GOAL #4   Title Pt will improve R grip strength by at least 10lbs for lifting.    Baseline 25lbs    Time 12  Period Weeks    Status New      OT LONG TERM GOAL #5   Title Pt will demo at least 110* R shoulder flex for functional reaching and incr ease with hair care.    Baseline 90*    Time 12    Period Weeks    Status New       OT LONG TERM GOAL #6   Title Pt will be able to eat with dominant RUE 100% of the time.    Baseline 50%    Time 12    Period Weeks    Status New                 Plan - 02/05/20 1312    Clinical Impression Statement Pt tolerating initial HEP well.  Pt continues to be significantly limited in elbow ext.  Pt reports performing HEP consistently.    OT Occupational Profile and History Detailed Assessment- Review of Records and additional review of physical, cognitive, psychosocial history related to current functional performance    Occupational performance deficits (Please refer to evaluation for details): ADL's;IADL's    Body Structure / Function / Physical Skills Balance;Mobility;Skin integrity;Strength;Edema;ADL;UE functional use;Pain;ROM;Sensation;IADL    Rehab Potential Fair    Clinical Decision Making Several treatment options, min-mod task modification necessary    Comorbidities Affecting Occupational Performance: May have comorbidities impacting occupational performance    Modification or Assistance to Complete Evaluation  Min-Moderate modification of tasks or assist with assess necessary to complete eval    OT Frequency 1x / week   at pt request due to financial concerns   OT Duration 12 weeks   +eval, may d/c earlier dependent on progress   OT Treatment/Interventions Therapeutic exercise;Functional Mobility Training;Self-care/ADL training;Ultrasound;Neuromuscular education;Manual Therapy;Splinting;Therapeutic activities;Cryotherapy;DME and/or AE instruction;Scar mobilization;Electrical Stimulation;Moist Heat;Contrast Bath;Passive range of motion;Patient/family education    Plan continue with AROM/PROM, weighted stretches, UBE, possibly estim?, gentle stengthening depending on pain    Recommended Other Services physical therapy due to hx of falls, and decr functional mobility (unable to go upstairs)    Consulted and Agree with Plan of Care Patient           Patient will  benefit from skilled therapeutic intervention in order to improve the following deficits and impairments:   Body Structure / Function / Physical Skills: Balance, Mobility, Skin integrity, Strength, Edema, ADL, UE functional use, Pain, ROM, Sensation, IADL       Visit Diagnosis: Stiffness of right elbow, not elsewhere classified  Stiffness of right shoulder, not elsewhere classified  Muscle weakness (generalized)  Other symptoms and signs involving the nervous system  Localized edema    Problem List Patient Active Problem List   Diagnosis Date Noted  . Nonproliferative diabetic retinopathy of both eyes ([place]) 01/06/2020  . Degenerative retinal drusen of both eyes 01/06/2020  . Posterior vitreous detachment of left eye 01/06/2020  . Early stage nonexudative age-related macular degeneration of both eyes 01/06/2020  . Postoperative infection   . Abscess of right upper arm and forearm 09/15/2019  . Cellulitis of right upper extremity 09/14/2019  . Insulin-requiring or dependent type II diabetes mellitus ([place]) 09/14/2019  . Hyponatremia   . Right supracondylar humerus fracture, closed, initial encounter 08/20/2019  . Abnormal EKG 08/10/2016  . Hypertension 08/10/2016    [facility] 02/05/2020, 3:43 PM  [facility] [address] [place], [place], [address] Phone: [phone]   Fax:  [phone]  Name: Kayla Griffin MRN: [id number] Date  of Birth: Dec 21, 1940   [name], OTR/L [facility] [address] [place], [place]  [address] [phone] phone [phone] 02/05/20 3:43 PM

## 2020-02-12 ENCOUNTER — Encounter: Payer: Self-pay | Admitting: Occupational Therapy

## 2020-02-12 ENCOUNTER — Other Ambulatory Visit: Payer: Self-pay

## 2020-02-12 ENCOUNTER — Ambulatory Visit: Payer: Medicare HMO | Attending: Orthopaedic Surgery | Admitting: Occupational Therapy

## 2020-02-12 DIAGNOSIS — R600 Localized edema: Secondary | ICD-10-CM

## 2020-02-12 DIAGNOSIS — R293 Abnormal posture: Secondary | ICD-10-CM | POA: Insufficient documentation

## 2020-02-12 DIAGNOSIS — R2681 Unsteadiness on feet: Secondary | ICD-10-CM | POA: Insufficient documentation

## 2020-02-12 DIAGNOSIS — M6281 Muscle weakness (generalized): Secondary | ICD-10-CM | POA: Diagnosis not present

## 2020-02-12 DIAGNOSIS — M25621 Stiffness of right elbow, not elsewhere classified: Secondary | ICD-10-CM | POA: Diagnosis not present

## 2020-02-12 DIAGNOSIS — R29818 Other symptoms and signs involving the nervous system: Secondary | ICD-10-CM | POA: Diagnosis not present

## 2020-02-12 DIAGNOSIS — R2689 Other abnormalities of gait and mobility: Secondary | ICD-10-CM | POA: Insufficient documentation

## 2020-02-12 DIAGNOSIS — M25611 Stiffness of right shoulder, not elsewhere classified: Secondary | ICD-10-CM | POA: Diagnosis not present

## 2020-02-12 NOTE — Patient Instructions (Addendum)
        Flexion (Resistive)    Hold 1lb weight in hand and bend elbow, keeping wrist straight. Support elbow with folded towel on table, or hold close to body. Hold 3 seconds. Repeat 10-15 times. Do 2 sessions per day.  Copyright  VHI. All rights reserved.     Extension (Resistive)    Hold can/water bottle or 1lb weight. Point elbow up and out, and straighten arm without moving shoulder.  Thumb facing up.  Lower slowly by bending elbow. DO THIS LAYING DOWN.  HOLD 5sec each direction Repeat 10-15 times.  Do 2 sessions per day.

## 2020-02-12 NOTE — Therapy (Signed)
[facility] [address] [place], [place], [address] Phone: [phone]   Fax:  [phone]  Occupational Therapy Treatment  Patient Details  Name: Kayla Griffin MRN: [id number] Date of Birth: 1941/02/01 Referring Provider (OT): Dr. Frankey Shown   Encounter Date: 02/12/2020   OT End of Session - 02/12/20 1108    Visit Number 4    Number of Visits 13    Date for OT Re-Evaluation 04/11/20    Authorization Type [employer] Medicare    Authorization - Visit Number 4    Authorization - Number of Visits 10    Progress Note Due on Visit 10    OT Start Time 1105    OT Stop Time 1145    OT Time Calculation (min) 40 min    Activity Tolerance Patient tolerated treatment well    Behavior During Therapy [facility] for tasks assessed/performed           Past Medical History:  Diagnosis Date  . Ambulates with cane   . Arthritis   . Cancer [facility])    Endometrial - hysterectomy  . Depression   . Diabetes mellitus without complication ([place])    Type II  . Dyspnea    currently with arm pain 08/22/19  . Glaucoma   . HLD (hyperlipidemia)   . Hypertension   . Wears dentures     Past Surgical History:  Procedure Laterality Date  . ABDOMINAL HYSTERECTOMY    . CERVICAL FUSION  2007  . EYE SURGERY Bilateral    cataracts  . I & D EXTREMITY Right 09/15/2019   Procedure: IRRIGATION AND DEBRIDEMENT ELBOW;  Surgeon: Leandrew Koyanagi, MD;  Location: [place];  Service: Orthopedics;  Laterality: Right;  . IRRIGATION AND DEBRIDEMENT ELBOW Right 12/11/2019   Procedure: RIGHT ELBOW IRRIGATION AND DEBRIDEMENT;  Surgeon: Leandrew Koyanagi, MD;  Location: [place];  Service: Orthopedics;  Laterality: Right;  . ORIF HUMERUS FRACTURE Right 08/25/2019   Procedure: OPEN REDUCTION INTERNAL FIXATION (ORIF) RIGHT SUPRACONDYLAR HUMERUS FRACTURE, NEUROLYSIS ULNAR NERVE;  Surgeon: Leandrew Koyanagi, MD;  Location: [place];  Service: Orthopedics;  Laterality: Right;   . ORIF PATELLA Right 07/07/2016   Procedure: Partial Patellectomy;  Surgeon: Leandrew Koyanagi, MD;  Location: [place];  Service: Orthopedics;  Laterality: Right;    There were no vitals filed for this visit.   Subjective Assessment - 02/12/20 1106    Subjective  Pt reports incr pain for a couple of days after exercises last week.    Pertinent History ORIF Rt supracondylar humerus fx 08/19/19, I & D 09/14/19 and 12/18/19.    Patient Stated Goals be able to use arm more, be able to use my cane and not need walker (was using cane in R hand prior), be able to go upstairs again    Currently in Pain? Yes    Pain Score 2     Pain Location Elbow    Pain Orientation Right    Pain Descriptors / Indicators Aching    Pain Type Chronic pain    Pain Onset More than a month ago    Pain Frequency Intermittent    Aggravating Factors  weather changes    Pain Relieving Factors pain medications            Supine, gentle PROM elbow flex/ext within pt tolerance.  Isometric elbow flex/ext, contract/release exercises.  Supine, AAROM/self PROM elbow ext with shoulder flex with min cueing for compensation. Closed chain  shoulder flex, chest press, and shoulder abduction with elbow ext with min cueing.  Arm bike x 5 min no resistance (on tabletop) for incr ROM.    Assessed elbow ROM--see goals section below.       OT Education - 02/12/20 1143    Education Details Elbow flex and ext with 1lb weight--see pt instructions.    Person(s) Educated Patient    Methods Explanation;Demonstration;Handout;Verbal cues    Comprehension Verbalized understanding;Returned demonstration            OT Short Term Goals - 02/12/20 1201      OT SHORT TERM GOAL #1   Title Pt will be independent with initial HEP.--check STGs 02/23/20    Time 6    Period Weeks    Status Achieved      OT SHORT TERM GOAL #2   Title Pt will demo at least -55* R elbow ext for functional reach.    Baseline -70*    Time 6    Period Weeks     Status On-going   02/12/20:  -65*     OT SHORT TERM GOAL #3   Title Pt will demo at least 135* R elbow flexion for incr ease with using RUE to eat/care for hair.    Baseline 125*    Time 12    Period Weeks    Status On-going   02/12/20:  130*     OT SHORT TERM GOAL #4   Title Pt will demo at least 100* R shoulder flexion for functional reach    Baseline 90*    Time 6    Period Weeks    Status On-going      OT SHORT TERM GOAL #5   Title Pt will improve R grip strength by at least 5lbs to assist with lifting/opening containers.    Time 6    Period Weeks    Status New      OT SHORT TERM GOAL #6   Title Pt will be independent with edema management and desensitization techniques.    Time 6    Period Weeks    Status New             OT Long Term Goals - 01/12/20 1844      OT LONG TERM GOAL #1   Title Pt will be independent with updated HEP for strengthening.--check LTGs 04/11/20    Time 12    Period Weeks    Status New      OT LONG TERM GOAL #2   Title Pt will demo at least -45* R elbow ext for functional reach    Baseline -70*    Time 12    Period Weeks    Status New      OT LONG TERM GOAL #3   Title Pt will improve RUE strength be able to lift gallon of milk at midlevel to place in cart or pour.    Time 12    Period Weeks    Status New      OT LONG TERM GOAL #4   Title Pt will improve R grip strength by at least 10lbs for lifting.    Baseline 25lbs    Time 12    Period Weeks    Status New      OT LONG TERM GOAL #5   Title Pt will demo at least 110* R shoulder flex for functional reaching and incr ease with hair care.    Baseline 90*  Time 12    Period Weeks    Status New      OT LONG TERM GOAL #6   Title Pt will be able to eat with dominant RUE 100% of the time.    Baseline 50%    Time 12    Period Weeks    Status New                 Plan - 02/12/20 1122    Clinical Impression Statement Pt slowly progessing towards goals.  Pt demo small  improvements in ROM and strength.  Pt reports pain for several days after last session (will continue to monitor).    OT Occupational Profile and History Detailed Assessment- Review of Records and additional review of physical, cognitive, psychosocial history related to current functional performance    Occupational performance deficits (Please refer to evaluation for details): ADL's;IADL's    Body Structure / Function / Physical Skills Balance;Mobility;Skin integrity;Strength;Edema;ADL;UE functional use;Pain;ROM;Sensation;IADL    Rehab Potential Fair    Clinical Decision Making Several treatment options, min-mod task modification necessary    Comorbidities Affecting Occupational Performance: May have comorbidities impacting occupational performance    Modification or Assistance to Complete Evaluation  Min-Moderate modification of tasks or assist with assess necessary to complete eval    OT Frequency 1x / week   at pt request due to financial concerns   OT Duration 12 weeks   +eval, may d/c earlier dependent on progress   OT Treatment/Interventions Therapeutic exercise;Functional Mobility Training;Self-care/ADL training;Ultrasound;Neuromuscular education;Manual Therapy;Splinting;Therapeutic activities;Cryotherapy;DME and/or AE instruction;Scar mobilization;Electrical Stimulation;Moist Heat;Contrast Bath;Passive range of motion;Patient/family education    Plan continue with AROM/PROM,  UBE,  gentle stengthening depending    Recommended Other Services physical therapy due to hx of falls, and decr functional mobility (unable to go upstairs)    Consulted and Agree with Plan of Care Patient           Patient will benefit from skilled therapeutic intervention in order to improve the following deficits and impairments:   Body Structure / Function / Physical Skills: Balance, Mobility, Skin integrity, Strength, Edema, ADL, UE functional use, Pain, ROM, Sensation, IADL       Visit  Diagnosis: Stiffness of right elbow, not elsewhere classified  Stiffness of right shoulder, not elsewhere classified  Other symptoms and signs involving the nervous system  Localized edema  Muscle weakness (generalized)    Problem List Patient Active Problem List   Diagnosis Date Noted  . Nonproliferative diabetic retinopathy of both eyes ([place]) 01/06/2020  . Degenerative retinal drusen of both eyes 01/06/2020  . Posterior vitreous detachment of left eye 01/06/2020  . Early stage nonexudative age-related macular degeneration of both eyes 01/06/2020  . Postoperative infection   . Abscess of right upper arm and forearm 09/15/2019  . Cellulitis of right upper extremity 09/14/2019  . Insulin-requiring or dependent type II diabetes mellitus ([place]) 09/14/2019  . Hyponatremia   . Right supracondylar humerus fracture, closed, initial encounter 08/20/2019  . Abnormal EKG 08/10/2016  . Hypertension 08/10/2016    [facility] 02/12/2020, 2:03 PM  [facility] [address] [place], [place], [address] Phone: [phone]   Fax:  [phone]  Name: Kayla Griffin MRN: [id number] Date of Birth: 06-13-1940   [name], OTR/L [facility] [address] [place], [place]  [address] [phone] phone [phone] 02/12/20 2:03 PM

## 2020-02-17 ENCOUNTER — Ambulatory Visit: Payer: Medicare HMO | Admitting: Occupational Therapy

## 2020-02-17 ENCOUNTER — Other Ambulatory Visit: Payer: Self-pay

## 2020-02-17 ENCOUNTER — Encounter: Payer: Self-pay | Admitting: Physical Therapy

## 2020-02-17 ENCOUNTER — Ambulatory Visit: Payer: Medicare HMO | Admitting: Physical Therapy

## 2020-02-17 DIAGNOSIS — M6281 Muscle weakness (generalized): Secondary | ICD-10-CM | POA: Diagnosis not present

## 2020-02-17 DIAGNOSIS — M25621 Stiffness of right elbow, not elsewhere classified: Secondary | ICD-10-CM | POA: Diagnosis not present

## 2020-02-17 DIAGNOSIS — R2681 Unsteadiness on feet: Secondary | ICD-10-CM

## 2020-02-17 DIAGNOSIS — M25611 Stiffness of right shoulder, not elsewhere classified: Secondary | ICD-10-CM | POA: Diagnosis not present

## 2020-02-17 DIAGNOSIS — R2689 Other abnormalities of gait and mobility: Secondary | ICD-10-CM

## 2020-02-17 DIAGNOSIS — R600 Localized edema: Secondary | ICD-10-CM | POA: Diagnosis not present

## 2020-02-17 DIAGNOSIS — R29818 Other symptoms and signs involving the nervous system: Secondary | ICD-10-CM

## 2020-02-17 DIAGNOSIS — R293 Abnormal posture: Secondary | ICD-10-CM

## 2020-02-17 NOTE — Patient Instructions (Signed)
   Weighted stretch:   Support upper arm on folded pillow: hold 2 lb weight to stretch elbow straight. Hold 10 sec, then remove weight with other hand and relax elbow (bend up and down), then repeat 5 times, holding 10 sec. Each   Make sure to get resume orders with any instructions including precautions/contraindications if you do get surgery and return to therapy. Also, if he wants to hold therapy, please call us at [phone]. Levada Dy is your primary therapist

## 2020-02-17 NOTE — Therapy (Signed)
[facility] [address] [place], [place], [address] Phone: [phone]   Fax:  [phone]  Physical Therapy Treatment  Patient Details  Name: Kayla Griffin MRN: [id number] Date of Birth: 1940-07-18 Referring Provider (PT): Dr. Erlinda Hong   Encounter Date: 02/17/2020   PT End of Session - 02/17/20 1406    Visit Number 2    Number of Visits 9   pt not able to afford twice a week   Date for PT Re-Evaluation 05/05/20    Authorization Type Aetna Medicare (PN every 10th visit)    PT Start Time 1320    PT Stop Time 1405    PT Time Calculation (min) 45 min    Activity Tolerance Patient tolerated treatment well    Behavior During Therapy [facility] for tasks assessed/performed           Past Medical History:  Diagnosis Date  . Ambulates with cane   . Arthritis   . Cancer [facility])    Endometrial - hysterectomy  . Depression   . Diabetes mellitus without complication ([place])    Type II  . Dyspnea    currently with arm pain 08/22/19  . Glaucoma   . HLD (hyperlipidemia)   . Hypertension   . Wears dentures     Past Surgical History:  Procedure Laterality Date  . ABDOMINAL HYSTERECTOMY    . CERVICAL FUSION  2007  . EYE SURGERY Bilateral    cataracts  . I & D EXTREMITY Right 09/15/2019   Procedure: IRRIGATION AND DEBRIDEMENT ELBOW;  Surgeon: Leandrew Koyanagi, MD;  Location: [place];  Service: Orthopedics;  Laterality: Right;  . IRRIGATION AND DEBRIDEMENT ELBOW Right 12/11/2019   Procedure: RIGHT ELBOW IRRIGATION AND DEBRIDEMENT;  Surgeon: Leandrew Koyanagi, MD;  Location: [place];  Service: Orthopedics;  Laterality: Right;  . ORIF HUMERUS FRACTURE Right 08/25/2019   Procedure: OPEN REDUCTION INTERNAL FIXATION (ORIF) RIGHT SUPRACONDYLAR HUMERUS FRACTURE, NEUROLYSIS ULNAR NERVE;  Surgeon: Leandrew Koyanagi, MD;  Location: [place];  Service: Orthopedics;  Laterality: Right;  . ORIF PATELLA Right 07/07/2016   Procedure: Partial  Patellectomy;  Surgeon: Leandrew Koyanagi, MD;  Location: [place];  Service: Orthopedics;  Laterality: Right;    There were no vitals filed for this visit.   Subjective Assessment - 02/17/20 1322    Subjective Pt reports that her elbow extension is getting better.    Pertinent History ORIF humerus 08/2019 with I&D's for infection, HTN, DM, glaucoma, macular degeneration, diabetic retinopathy, arthritis, depression, HLD, hx of endometrial CA, abscess R RU, virteous detachment (L side)    Patient Stated Goals Be able to use SPC vs. RW, go up stairs    Currently in Pain? No/denies   arm tired from OT session   Pain Location --    Pain Orientation --    Pain Type --    Pain Onset --              [facility] Adult PT Treatment/Exercise - 02/17/20 0001      Transfers   Transfers Sit to Stand;Stand to Sit    Sit to Stand 5: Supervision;With armrests;From chair/3-in-1    Stand to Sit 5: Supervision;With armrests;To chair/3-in-1    Comments S for safety.      Ambulation/Gait   Ambulation/Gait Yes    Ambulation/Gait Assistance 5: Supervision    Ambulation Distance (Feet) --   in between activities in clinic   Assistive device Rolling walker  Gait Pattern Step-through pattern;Decreased stride length;Decreased dorsiflexion - right;Decreased dorsiflexion - left    Ambulation Surface Level;Indoor      Posture/Postural Control   Posture/Postural Control Postural limitations    Postural Limitations Rounded Shoulders;Forward head;Increased thoracic kyphosis;Right pelvic obliquity      Standardized Balance Assessment   Standardized Balance Assessment Berg Balance Test      Berg Balance Test   Sit to Stand Able to stand  independently using hands    Standing Unsupported Able to stand safely 2 minutes    Sitting with Back Unsupported but Feet Supported on Floor or Stool Able to sit safely and securely 2 minutes    Stand to Sit Sits safely with minimal use of hands    Transfers Able to transfer  safely, minor use of hands    Standing Unsupported with Eyes Closed Able to stand 10 seconds safely    Standing Ubsupported with Feet Together Able to place feet together independently and stand 1 minute safely    From Standing, Reach Forward with Outstretched Arm Can reach forward >12 cm safely (5")    From Standing Position, Pick up Object from Floor Able to pick up shoe safely and easily    From Standing Position, Turn to Look Behind Over each Shoulder Looks behind one side only/other side shows less weight shift    Turn 360 Degrees Able to turn 360 degrees safely but slowly    Standing Unsupported, Alternately Place Feet on Step/Stool Able to complete >2 steps/needs minimal assist    Standing Unsupported, One Foot in Front Able to take small step independently and hold 30 seconds    Standing on One Leg Unable to try or needs assist to prevent fall    Total Score 42               Balance Exercises - 02/17/20 0001      OTAGO PROGRAM   Head Movements Sitting;5 reps    Neck Movements Sitting;5 reps    Back Extension Standing;5 reps    Trunk Movements Standing;5 reps    Ankle Movements Sitting;10 reps    Knee Extensor 10 reps   no weight   Knee Flexor 10 reps   no weight   Overall OTAGO Comments Pt stood at RW for all standing exercises today.  pt does not have weights at home to use.  Instructed that she could alternate sides if wanted (vs 10 reps then switching sides)             PT Education - 02/17/20 1405    Education Details OTAGO exercises    Person(s) Educated Patient    Methods Explanation;Demonstration;Verbal cues;Handout    Comprehension Verbalized understanding;Need further instruction            PT Short Term Goals - 02/05/20 1325      PT SHORT TERM GOAL #1   Title Pt will be IND with HEP to improve balance, strength, and endurance. TARGET DATE FOR ALL STGS: 03/04/20    Time 4    Period Weeks    Status New      PT SHORT TERM GOAL #2   Title Perform  BERG and write STG/LTGs as indicated.    Time 4    Period Weeks    Status New      PT SHORT TERM GOAL #3   Title Pt will amb. 200' over even terrain with SPC at MOD I level to negotiate all areas of home safely.  Time 4    Period Weeks    Status New      PT SHORT TERM GOAL #4   Title Pt will improve gait speed to >/=2.44ft/sec. with LRAD to improve functional mobility and gait efficiency.    Baseline 1.31ft/sec and 1.34ft/sec with RW    Time 4    Period Weeks    Status New             PT Long Term Goals - 02/05/20 1330      PT LONG TERM GOAL #1   Title Pt will improve TUG time with LRAD to 13.5sec to decr. falls risk.    Baseline 18.18 sec c RW    Time 8    Period Weeks    Status New      PT LONG TERM GOAL #2   Title Pt will improve gait speed to >/=2.41ft/sec with LRAD to safely amb. in the community.    Baseline 1.2ft/sec and 1.55ft/sec c RW    Time 8    Period Weeks    Status New      PT LONG TERM GOAL #3   Title Pt will amb. 500' over even and paved surfaces with LRAD to improve functional mobility.    Time 8    Period Weeks    Status New      PT LONG TERM GOAL #4   Title Perform DGI if applicable and write goal.    Time 8    Period Weeks    Status New      PT LONG TERM GOAL #5   Title Pt will traverse 16 steps with one handrail and SPC at MOD I level to safely traverse stairs at home.    Time 8    Period Weeks    Status New                 Plan - 02/17/20 1406    Clinical Impression Statement Pt scored 42/56 on BERG today.  PT to set goal if appropriate.  Initated OTAGO program as HEP.  Pt tolerated well and able to perform with min cues due to first time.  Cont per poc.    Personal Factors and Comorbidities Age;Comorbidity 3+;Finances    Comorbidities ORIF humerus 08/2019 with I&D's for infection, HTN, DM, glaucoma, macular degeneration, diabetic retinopathy, arthritis, depression, HLD, hx of endometrial CA, abscess R RU, virteous detachment  (L side)    Examination-Activity Limitations Carry;Stand;Stairs;Squat;Transfers;Locomotion Level    Examination-Participation Restrictions Driving;Meal Prep;Cleaning;Laundry;Community Activity    Stability/Clinical Decision Making Evolving/Moderate complexity    Rehab Potential Good    PT Frequency 1x / week   pt not able to afford twice a week   PT Duration 8 weeks    PT Treatment/Interventions ADLs/Self Care Home Management;Aquatic Therapy;Biofeedback;Canalith Repostioning;Balance training;Therapeutic exercise;Manual techniques;Vestibular;Therapeutic activities;Functional mobility training;Orthotic Fit/Training;Stair training;Gait training;DME Instruction;Patient/family education;Neuromuscular re-education    PT Next Visit Plan Review initial OTAGO and add to program.    Consulted and Agree with Plan of Care Patient           Patient will benefit from skilled therapeutic intervention in order to improve the following deficits and impairments:  Abnormal gait, Decreased endurance, Impaired sensation, Decreased knowledge of use of DME, Decreased balance, Decreased mobility, Decreased strength, Impaired UE functional use, Increased edema, Decreased range of motion, Impaired flexibility, Postural dysfunction  Visit Diagnosis: Other abnormalities of gait and mobility  Muscle weakness (generalized)  Unsteadiness on feet  Abnormal posture  Problem List Patient Active Problem List   Diagnosis Date Noted  . Nonproliferative diabetic retinopathy of both eyes ([place]) 01/06/2020  . Degenerative retinal drusen of both eyes 01/06/2020  . Posterior vitreous detachment of left eye 01/06/2020  . Early stage nonexudative age-related macular degeneration of both eyes 01/06/2020  . Postoperative infection   . Abscess of right upper arm and forearm 09/15/2019  . Cellulitis of right upper extremity 09/14/2019  . Insulin-requiring or dependent type II diabetes mellitus ([place]) 09/14/2019  .  Hyponatremia   . Right supracondylar humerus fracture, closed, initial encounter 08/20/2019  . Abnormal EKG 08/10/2016  . Hypertension 08/10/2016    Narda Bonds, PTA [place] 02/17/20 2:09 PM Phone: [phone] Fax: [facility] [address] [place], [place], [address] Phone: [phone]   Fax:  [phone]  Name: Kayla Griffin MRN: [id number] Date of Birth: 05-28-1940

## 2020-02-17 NOTE — Therapy (Signed)
[facility] [address] [place], [place], [address] Phone: [phone]   Fax:  [phone]  Occupational Therapy Treatment  Patient Details  Name: Kayla Griffin MRN: [id number] Date of Birth: Jun 24, 1940 Referring Provider (OT): Dr. Frankey Shown   Encounter Date: 02/17/2020   OT End of Session - 02/17/20 1351    Visit Number 5    Number of Visits 13    Date for OT Re-Evaluation 04/11/20    Authorization Type Aetna Medicare    Authorization - Visit Number 5    Authorization - Number of Visits 10    Progress Note Due on Visit 10    OT Start Time 1233    OT Stop Time 1315    OT Time Calculation (min) 42 min    Activity Tolerance Patient tolerated treatment well    Behavior During Therapy [facility] for tasks assessed/performed           Past Medical History:  Diagnosis Date  . Ambulates with cane   . Arthritis   . Cancer [facility])    Endometrial - hysterectomy  . Depression   . Diabetes mellitus without complication ([place])    Type II  . Dyspnea    currently with arm pain 08/22/19  . Glaucoma   . HLD (hyperlipidemia)   . Hypertension   . Wears dentures     Past Surgical History:  Procedure Laterality Date  . ABDOMINAL HYSTERECTOMY    . CERVICAL FUSION  2007  . EYE SURGERY Bilateral    cataracts  . I & D EXTREMITY Right 09/15/2019   Procedure: IRRIGATION AND DEBRIDEMENT ELBOW;  Surgeon: Leandrew Koyanagi, MD;  Location: [place];  Service: Orthopedics;  Laterality: Right;  . IRRIGATION AND DEBRIDEMENT ELBOW Right 12/11/2019   Procedure: RIGHT ELBOW IRRIGATION AND DEBRIDEMENT;  Surgeon: Leandrew Koyanagi, MD;  Location: [place];  Service: Orthopedics;  Laterality: Right;  . ORIF HUMERUS FRACTURE Right 08/25/2019   Procedure: OPEN REDUCTION INTERNAL FIXATION (ORIF) RIGHT SUPRACONDYLAR HUMERUS FRACTURE, NEUROLYSIS ULNAR NERVE;  Surgeon: Leandrew Koyanagi, MD;  Location: [place];  Service: Orthopedics;  Laterality: Right;   . ORIF PATELLA Right 07/07/2016   Procedure: Partial Patellectomy;  Surgeon: Leandrew Koyanagi, MD;  Location: [place];  Service: Orthopedics;  Laterality: Right;    There were no vitals filed for this visit.   Subjective Assessment - 02/17/20 1235    Subjective  I see Dr. Erlinda Hong this Thursday to see if there is another infection. He will have to do surgery to remove the hardware if it is another infection    Pertinent History ORIF Rt supracondylar humerus fx 08/19/19, I & D 09/14/19 and 12/18/19.    Patient Stated Goals be able to use arm more, be able to use my cane and not need walker (was using cane in R hand prior), be able to go upstairs again    Currently in Pain? Yes   no pain at rest   Pain Score 2     Pain Orientation Right    Pain Descriptors / Indicators Aching    Pain Type Chronic pain    Pain Onset More than a month ago    Pain Frequency Intermittent    Aggravating Factors  P/ROM, cold weather    Pain Relieving Factors pain medications          Discussed possibility of surgery to remove hardware and requiring resume orders from surgery. However did recommend continuing  therapy up until surgery to maintain ROM. Pt also instructed to avoid heat modalities.  Performed self P/ROM with cues for hand placement and proper positioning in elbow flexion and extension. Supination WFL's and pronation w/ slight limitations only at end range.  Progressed to gentle strengthening in elbow flex and ext with 1 lb weight, followed by weighted stretch in elbow ext with 2 lb weight. UBE x 8 minutes for shoulder and elbow ROM and UB conditioning.  Pt still compensates w/ shoulder, neck/head, and wrist at times.                      OT Education - 02/17/20 1305    Education Details weighted stretch in elbow ext    Person(s) Educated Patient    Methods Explanation;Demonstration;Handout;Verbal cues    Comprehension Verbalized understanding;Returned demonstration            OT Short  Term Goals - 02/12/20 1201      OT SHORT TERM GOAL #1   Title Pt will be independent with initial HEP.--check STGs 02/23/20    Time 6    Period Weeks    Status Achieved      OT SHORT TERM GOAL #2   Title Pt will demo at least -55* R elbow ext for functional reach.    Baseline -70*    Time 6    Period Weeks    Status On-going   02/12/20:  -65*     OT SHORT TERM GOAL #3   Title Pt will demo at least 135* R elbow flexion for incr ease with using RUE to eat/care for hair.    Baseline 125*    Time 12    Period Weeks    Status On-going   02/12/20:  130*     OT SHORT TERM GOAL #4   Title Pt will demo at least 100* R shoulder flexion for functional reach    Baseline 90*    Time 6    Period Weeks    Status On-going      OT SHORT TERM GOAL #5   Title Pt will improve R grip strength by at least 5lbs to assist with lifting/opening containers.    Time 6    Period Weeks    Status New      OT SHORT TERM GOAL #6   Title Pt will be independent with edema management and desensitization techniques.    Time 6    Period Weeks    Status New             OT Long Term Goals - 01/12/20 1844      OT LONG TERM GOAL #1   Title Pt will be independent with updated HEP for strengthening.--check LTGs 04/11/20    Time 12    Period Weeks    Status New      OT LONG TERM GOAL #2   Title Pt will demo at least -45* R elbow ext for functional reach    Baseline -70*    Time 12    Period Weeks    Status New      OT LONG TERM GOAL #3   Title Pt will improve RUE strength be able to lift gallon of milk at midlevel to place in cart or pour.    Time 12    Period Weeks    Status New      OT LONG TERM GOAL #4   Title Pt will improve R  grip strength by at least 10lbs for lifting.    Baseline 25lbs    Time 12    Period Weeks    Status New      OT LONG TERM GOAL #5   Title Pt will demo at least 110* R shoulder flex for functional reaching and incr ease with hair care.    Baseline 90*    Time 12     Period Weeks    Status New      OT LONG TERM GOAL #6   Title Pt will be able to eat with dominant RUE 100% of the time.    Baseline 50%    Time 12    Period Weeks    Status New                 Plan - 02/17/20 1352    Clinical Impression Statement Pt sees MD this week to reassess Rt elbow for signs of infection as pt is swollen and with continued redness at elbow.    OT Occupational Profile and History Detailed Assessment- Review of Records and additional review of physical, cognitive, psychosocial history related to current functional performance    Occupational performance deficits (Please refer to evaluation for details): ADL's;IADL's    Body Structure / Function / Physical Skills Balance;Mobility;Skin integrity;Strength;Edema;ADL;UE functional use;Pain;ROM;Sensation;IADL    Rehab Potential Fair    Clinical Decision Making Several treatment options, min-mod task modification necessary    Comorbidities Affecting Occupational Performance: May have comorbidities impacting occupational performance    Modification or Assistance to Complete Evaluation  Min-Moderate modification of tasks or assist with assess necessary to complete eval    OT Frequency 1x / week   at pt request due to financial concerns   OT Duration 12 weeks   +eval, may d/c earlier dependent on progress   OT Treatment/Interventions Therapeutic exercise;Functional Mobility Training;Self-care/ADL training;Ultrasound;Neuromuscular education;Manual Therapy;Splinting;Therapeutic activities;Cryotherapy;DME and/or AE instruction;Scar mobilization;Electrical Stimulation;Moist Heat;Contrast Bath;Passive range of motion;Patient/family education    Plan Pt to see Dr. Erlinda Hong w/ possibility of surgery to remove hardware - will follow recommendations if this occurs, otherwise continue A/ROM, P/ROM, UBE, gentle strengthening    Recommended Other Services physical therapy due to hx of falls, and decr functional mobility (unable to go  upstairs)    Consulted and Agree with Plan of Care Patient           Patient will benefit from skilled therapeutic intervention in order to improve the following deficits and impairments:   Body Structure / Function / Physical Skills: Balance, Mobility, Skin integrity, Strength, Edema, ADL, UE functional use, Pain, ROM, Sensation, IADL       Visit Diagnosis: Stiffness of right elbow, not elsewhere classified  Other symptoms and signs involving the nervous system  Muscle weakness (generalized)    Problem List Patient Active Problem List   Diagnosis Date Noted  . Nonproliferative diabetic retinopathy of both eyes ([place]) 01/06/2020  . Degenerative retinal drusen of both eyes 01/06/2020  . Posterior vitreous detachment of left eye 01/06/2020  . Early stage nonexudative age-related macular degeneration of both eyes 01/06/2020  . Postoperative infection   . Abscess of right upper arm and forearm 09/15/2019  . Cellulitis of right upper extremity 09/14/2019  . Insulin-requiring or dependent type II diabetes mellitus ([place]) 09/14/2019  . Hyponatremia   . Right supracondylar humerus fracture, closed, initial encounter 08/20/2019  . Abnormal EKG 08/10/2016  . Hypertension 08/10/2016    Carey Bullocks, OTR/L 02/17/2020, 2:20 PM  [facility] [address] [place], [place], [address] Phone: [phone]   Fax:  [phone]  Name: WILLINE SCHWALBE MRN: [id number] Date of Birth: [birth year]

## 2020-02-19 ENCOUNTER — Encounter: Payer: Self-pay | Admitting: Orthopaedic Surgery

## 2020-02-19 ENCOUNTER — Ambulatory Visit (INDEPENDENT_AMBULATORY_CARE_PROVIDER_SITE_OTHER): Payer: Medicare HMO

## 2020-02-19 ENCOUNTER — Ambulatory Visit (INDEPENDENT_AMBULATORY_CARE_PROVIDER_SITE_OTHER): Payer: Medicare HMO | Admitting: Orthopaedic Surgery

## 2020-02-19 DIAGNOSIS — S42411A Displaced simple supracondylar fracture without intercondylar fracture of right humerus, initial encounter for closed fracture: Secondary | ICD-10-CM

## 2020-02-19 DIAGNOSIS — T8142XD Infection following a procedure, deep incisional surgical site, subsequent encounter: Secondary | ICD-10-CM

## 2020-02-19 NOTE — Progress Notes (Signed)
Post-Op Visit Note   Patient: Kayla Griffin           Date of Birth: [birth year]           MRN: [id number] Visit Date: 02/19/2020 PCP: Aretta Nip, MD   Assessment & Plan:  Chief Complaint:  Chief Complaint  Patient presents with  . Right Elbow - Pain   Visit Diagnoses:  1. Right supracondylar humerus fracture, closed, initial encounter   2. Infection of deep incisional surgical site after procedure, subsequent encounter     Plan: Kayla Griffin is 10 weeks status post I&D right elbow infection and hardware removal.  Overall doing well.  Only has some mild discomfort with OT.  She has been off antibiotics for 3 weeks.  The elbow is slightly warm and swollen and erythematous but the wounds are all intact.  No pain with the elbow with range of motion.  X-rays demonstrate consolidated fractures and at this point she should continue with OT for elbow rehab.  I would like to recheck her in 2 months with two-view x-rays of the right elbow or sooner if she has any issues such as recurrent infection or wound opening back up.  Follow-Up Instructions: Return in about 2 months (around 04/20/2020).   Orders:  Orders Placed This Encounter  Procedures  . XR Elbow 2 Views Right   No orders of the defined types were placed in this encounter.   Imaging: XR Elbow 2 Views Right  Result Date: 02/19/2020 Abundant callus formation.  Fractures appear to be consolidated.  No hardware complications.   PMFS History: Patient Active Problem List   Diagnosis Date Noted  . Nonproliferative diabetic retinopathy of both eyes ([place]) 01/06/2020  . Degenerative retinal drusen of both eyes 01/06/2020  . Posterior vitreous detachment of left eye 01/06/2020  . Early stage nonexudative age-related macular degeneration of both eyes 01/06/2020  . Postoperative infection   . Abscess of right upper arm and forearm 09/15/2019  . Cellulitis of right upper extremity 09/14/2019  . Insulin-requiring or  dependent type II diabetes mellitus ([place]) 09/14/2019  . Hyponatremia   . Right supracondylar humerus fracture, closed, initial encounter 08/20/2019  . Abnormal EKG 08/10/2016  . Hypertension 08/10/2016   Past Medical History:  Diagnosis Date  . Ambulates with cane   . Arthritis   . Cancer [facility])    Endometrial - hysterectomy  . Depression   . Diabetes mellitus without complication ([place])    Type II  . Dyspnea    currently with arm pain 08/22/19  . Glaucoma   . HLD (hyperlipidemia)   . Hypertension   . Wears dentures     Family History  Problem Relation Age of Onset  . CVA Mother   . Heart disease Father 26  . Heart attack Sister   . Heart disease Sister   . Arrhythmia Sister   . CVA Sister     Past Surgical History:  Procedure Laterality Date  . ABDOMINAL HYSTERECTOMY    . CERVICAL FUSION  2007  . EYE SURGERY Bilateral    cataracts  . I & D EXTREMITY Right 09/15/2019   Procedure: IRRIGATION AND DEBRIDEMENT ELBOW;  Surgeon: Leandrew Koyanagi, MD;  Location: [place];  Service: Orthopedics;  Laterality: Right;  . IRRIGATION AND DEBRIDEMENT ELBOW Right 12/11/2019   Procedure: RIGHT ELBOW IRRIGATION AND DEBRIDEMENT;  Surgeon: Leandrew Koyanagi, MD;  Location: [place];  Service: Orthopedics;  Laterality: Right;  . ORIF HUMERUS FRACTURE  Right 08/25/2019   Procedure: OPEN REDUCTION INTERNAL FIXATION (ORIF) RIGHT SUPRACONDYLAR HUMERUS FRACTURE, NEUROLYSIS ULNAR NERVE;  Surgeon: Leandrew Koyanagi, MD;  Location: [place];  Service: Orthopedics;  Laterality: Right;  . ORIF PATELLA Right 07/07/2016   Procedure: Partial Patellectomy;  Surgeon: Leandrew Koyanagi, MD;  Location: [place];  Service: Orthopedics;  Laterality: Right;   Social History   Occupational History  . Not on file  Tobacco Use  . Smoking status: Never Smoker  . Smokeless tobacco: Never Used  Vaping Use  . Vaping Use: Never used  Substance and Sexual Activity  . Alcohol use: No  . Drug use: No  . Sexual activity: Not  Currently    Birth control/protection: Surgical    Comment: Hysterectomy

## 2020-02-24 ENCOUNTER — Other Ambulatory Visit: Payer: Self-pay

## 2020-02-24 ENCOUNTER — Ambulatory Visit: Payer: Medicare HMO | Admitting: Occupational Therapy

## 2020-02-24 ENCOUNTER — Encounter: Payer: Self-pay | Admitting: Physical Therapy

## 2020-02-24 ENCOUNTER — Ambulatory Visit: Payer: Medicare HMO | Admitting: Physical Therapy

## 2020-02-24 DIAGNOSIS — M6281 Muscle weakness (generalized): Secondary | ICD-10-CM

## 2020-02-24 DIAGNOSIS — R29818 Other symptoms and signs involving the nervous system: Secondary | ICD-10-CM

## 2020-02-24 DIAGNOSIS — M25621 Stiffness of right elbow, not elsewhere classified: Secondary | ICD-10-CM | POA: Diagnosis not present

## 2020-02-24 DIAGNOSIS — R2689 Other abnormalities of gait and mobility: Secondary | ICD-10-CM | POA: Diagnosis not present

## 2020-02-24 DIAGNOSIS — M25611 Stiffness of right shoulder, not elsewhere classified: Secondary | ICD-10-CM

## 2020-02-24 DIAGNOSIS — R600 Localized edema: Secondary | ICD-10-CM | POA: Diagnosis not present

## 2020-02-24 DIAGNOSIS — R293 Abnormal posture: Secondary | ICD-10-CM

## 2020-02-24 DIAGNOSIS — R2681 Unsteadiness on feet: Secondary | ICD-10-CM

## 2020-02-24 NOTE — Therapy (Signed)
[facility] [address] [place], [place], [address] Phone: [phone]   Fax:  [phone]  Physical Therapy Treatment  Patient Details  Name: Kayla Griffin MRN: [id number] Date of Birth: March 15, 1941 Referring Provider (PT): Dr. Erlinda Hong   Encounter Date: 02/24/2020   PT End of Session - 02/24/20 1231    Visit Number 3    Number of Visits 9   pt not able to afford twice a week   Date for PT Re-Evaluation 05/05/20    Authorization Type [employer] (PN every 10th visit)    PT Start Time 1145    PT Stop Time 1230    PT Time Calculation (min) 45 min    Equipment Utilized During Treatment Gait belt    Activity Tolerance Patient tolerated treatment well    Behavior During Therapy [facility] for tasks assessed/performed           Past Medical History:  Diagnosis Date  . Ambulates with cane   . Arthritis   . Cancer [facility])    Endometrial - hysterectomy  . Depression   . Diabetes mellitus without complication ([place])    Type II  . Dyspnea    currently with arm pain 08/22/19  . Glaucoma   . HLD (hyperlipidemia)   . Hypertension   . Wears dentures     Past Surgical History:  Procedure Laterality Date  . ABDOMINAL HYSTERECTOMY    . CERVICAL FUSION  2007  . EYE SURGERY Bilateral    cataracts  . I & D EXTREMITY Right 09/15/2019   Procedure: IRRIGATION AND DEBRIDEMENT ELBOW;  Surgeon: Leandrew Koyanagi, MD;  Location: [place];  Service: Orthopedics;  Laterality: Right;  . IRRIGATION AND DEBRIDEMENT ELBOW Right 12/11/2019   Procedure: RIGHT ELBOW IRRIGATION AND DEBRIDEMENT;  Surgeon: Leandrew Koyanagi, MD;  Location: [place];  Service: Orthopedics;  Laterality: Right;  . ORIF HUMERUS FRACTURE Right 08/25/2019   Procedure: OPEN REDUCTION INTERNAL FIXATION (ORIF) RIGHT SUPRACONDYLAR HUMERUS FRACTURE, NEUROLYSIS ULNAR NERVE;  Surgeon: Leandrew Koyanagi, MD;  Location: [place];  Service: Orthopedics;  Laterality: Right;  . ORIF  PATELLA Right 07/07/2016   Procedure: Partial Patellectomy;  Surgeon: Leandrew Koyanagi, MD;  Location: [place];  Service: Orthopedics;  Laterality: Right;    There were no vitals filed for this visit.   Subjective Assessment - 02/24/20 1151    Subjective Pt reports she was able to go up 9 steps in her house but could not get up on the landing because the railing runs out.  She backed down the steps.  Is looking into options for railing at the landing. Pt states MD states she does not have any restrictions on her R arm but middle finger may not every fully extend actively.    Pertinent History ORIF humerus 08/2019 with I&D's for infection, HTN, DM, glaucoma, macular degeneration, diabetic retinopathy, arthritis, depression, HLD, hx of endometrial CA, abscess R RU, virteous detachment (L side)    Patient Stated Goals Be able to use SPC vs. RW, go up stairs    Currently in Pain? Yes    Pain Score 2     Pain Location Arm    Pain Orientation Right    Pain Descriptors / Indicators Aching    Pain Type Chronic pain    Pain Onset More than a month ago    Pain Frequency Intermittent    Pain Relieving Factors pain meds at times    Multiple  Pain Sites No                             [facility] Adult PT Treatment/Exercise - 02/24/20 0001      Transfers   Transfers Sit to Stand;Stand to Sit    Sit to Stand 5: Supervision    Stand to Sit 5: Supervision    Number of Reps Other reps (comment)   5 and trhough out session   Comments S for safety.      Ambulation/Gait   Ambulation/Gait Yes    Ambulation/Gait Assistance 5: Supervision;4: Min guard    Ambulation/Gait Assistance Details verbal cues for safety and initial instruciton with quad cane.  Pt prefers to use quad cane in her right hand and reports using her cane at home in R hand.    Ambulation Distance (Feet) 110 Feet   x 2 and 40' x 2   Assistive device Rolling walker;Small based quad cane    Gait Pattern Step-through pattern;Decreased  stride length;Decreased dorsiflexion - right;Decreased dorsiflexion - left    Ambulation Surface Level;Indoor      Posture/Postural Control   Posture/Postural Control Postural limitations    Postural Limitations Rounded Shoulders;Forward head;Increased thoracic kyphosis;Right pelvic obliquity      Exercises   Exercises Knee/Hip      Knee/Hip Exercises: Aerobic   Other Aerobic Scifit level 1.5 all 4 extremties x 5 minutes with no c/o R arm pain, only "a little tired"               Balance Exercises - 02/24/20 0001      OTAGO PROGRAM   Hip ABductor 10 reps    Knee Bends 10 reps, support    Backwards Walking Support    Walking and Turning Around No assistive device    Sideways Walking Assistive device    Tandem Stance 10 seconds, support    One Leg Stand 10 seconds, support    Heel Walking --   Pt unable to perform safely so did not give as HEP   Toe Walk --   Pt unable to perform safely so did not give as HEP   Sit to Stand 5 reps, bilateral support    Overall OTAGO Comments Pt standing at counter for support for exercises today.  Pt able to verbally state and demonstrate exercises provided on previous session.             PT Education - 02/24/20 1233    Education Details OTAGO program    Person(s) Educated Patient    Methods Explanation;Demonstration;Tactile cues;Verbal cues;Handout    Comprehension Verbalized understanding;Returned demonstration            PT Short Term Goals - 02/05/20 1325      PT SHORT TERM GOAL #1   Title Pt will be IND with HEP to improve balance, strength, and endurance. TARGET DATE FOR ALL STGS: 03/04/20    Time 4    Period Weeks    Status New      PT SHORT TERM GOAL #2   Title Perform BERG and write STG/LTGs as indicated.    Time 4    Period Weeks    Status New      PT SHORT TERM GOAL #3   Title Pt will amb. 200' over even terrain with SPC at MOD I level to negotiate all areas of home safely.    Time 4    Period Weeks  Status New      PT SHORT TERM GOAL #4   Title Pt will improve gait speed to >/=2.24ft/sec. with LRAD to improve functional mobility and gait efficiency.    Baseline 1.52ft/sec and 1.52ft/sec with RW    Time 4    Period Weeks    Status New             PT Long Term Goals - 02/05/20 1330      PT LONG TERM GOAL #1   Title Pt will improve TUG time with LRAD to 13.5sec to decr. falls risk.    Baseline 18.18 sec c RW    Time 8    Period Weeks    Status New      PT LONG TERM GOAL #2   Title Pt will improve gait speed to >/=2.45ft/sec with LRAD to safely amb. in the community.    Baseline 1.20ft/sec and 1.69ft/sec c RW    Time 8    Period Weeks    Status New      PT LONG TERM GOAL #3   Title Pt will amb. 500' over even and paved surfaces with LRAD to improve functional mobility.    Time 8    Period Weeks    Status New      PT LONG TERM GOAL #4   Title Perform DGI if applicable and write goal.    Time 8    Period Weeks    Status New      PT LONG TERM GOAL #5   Title Pt will traverse 16 steps with one handrail and SPC at MOD I level to safely traverse stairs at home.    Time 8    Period Weeks    Status New                 Plan - 02/24/20 1232    Clinical Impression Statement Pt tolerated increased aciivity during session today and was able to return demo of exercises provided previous session.  Pt plans on purchasing quad cane to transition to at home.  Cont per poc.    Personal Factors and Comorbidities Age;Comorbidity 3+;Finances    Comorbidities ORIF humerus 08/2019 with I&D's for infection, HTN, DM, glaucoma, macular degeneration, diabetic retinopathy, arthritis, depression, HLD, hx of endometrial CA, abscess R RU, virteous detachment (L side)    Examination-Activity Limitations Carry;Stand;Stairs;Squat;Transfers;Locomotion Level    Examination-Participation Restrictions Driving;Meal Prep;Cleaning;Laundry;Community Activity    Stability/Clinical Decision Making  Evolving/Moderate complexity    Rehab Potential Good    PT Frequency 1x / week   pt not able to afford twice a week   PT Duration 8 weeks    PT Treatment/Interventions ADLs/Self Care Home Management;Aquatic Therapy;Biofeedback;Canalith Repostioning;Balance training;Therapeutic exercise;Manual techniques;Vestibular;Therapeutic activities;Functional mobility training;Orthotic Fit/Training;Stair training;Gait training;DME Instruction;Patient/family education;Neuromuscular re-education    PT Next Visit Plan Review OTAGO and revise if needed. continue balance, gait and stair instruction.    PT Home Exercise Plan OTAGO    Consulted and Agree with Plan of Care Patient           Patient will benefit from skilled therapeutic intervention in order to improve the following deficits and impairments:  Abnormal gait, Decreased endurance, Impaired sensation, Decreased knowledge of use of DME, Decreased balance, Decreased mobility, Decreased strength, Impaired UE functional use, Increased edema, Decreased range of motion, Impaired flexibility, Postural dysfunction  Visit Diagnosis: Other abnormalities of gait and mobility  Muscle weakness (generalized)  Unsteadiness on feet  Abnormal posture  Problem List Patient Active Problem List   Diagnosis Date Noted  . Nonproliferative diabetic retinopathy of both eyes ([place]) 01/06/2020  . Degenerative retinal drusen of both eyes 01/06/2020  . Posterior vitreous detachment of left eye 01/06/2020  . Early stage nonexudative age-related macular degeneration of both eyes 01/06/2020  . Postoperative infection   . Abscess of right upper arm and forearm 09/15/2019  . Cellulitis of right upper extremity 09/14/2019  . Insulin-requiring or dependent type II diabetes mellitus ([place]) 09/14/2019  . Hyponatremia   . Right supracondylar humerus fracture, closed, initial encounter 08/20/2019  . Abnormal EKG 08/10/2016  . Hypertension 08/10/2016    Narda Bonds, PTA [place] 02/24/20 1:56 PM Phone: [phone] Fax: [facility] [address] [place], [place], [address] Phone: [phone]   Fax:  [phone]  Name: Kayla Griffin MRN: [id number] Date of Birth: 1941-03-05

## 2020-02-24 NOTE — Therapy (Signed)
[facility] [address] [place], [place], [address] Phone: [phone]   Fax:  [phone]  Occupational Therapy Treatment  Patient Details  Name: Kayla Griffin MRN: [id number] Date of Birth: September 26, 1940 Referring Provider (OT): Dr. Frankey Shown   Encounter Date: 02/24/2020   OT End of Session - 02/24/20 1249    Visit Number 6    Number of Visits 13    Date for OT Re-Evaluation 04/11/20    Authorization - Visit Number 6    Authorization - Number of Visits 10    OT Start Time 8938    OT Stop Time 1017    OT Time Calculation (min) 39 min           Past Medical History:  Diagnosis Date  . Ambulates with cane   . Arthritis   . Cancer [facility])    Endometrial - hysterectomy  . Depression   . Diabetes mellitus without complication ([place])    Type II  . Dyspnea    currently with arm pain 08/22/19  . Glaucoma   . HLD (hyperlipidemia)   . Hypertension   . Wears dentures     Past Surgical History:  Procedure Laterality Date  . ABDOMINAL HYSTERECTOMY    . CERVICAL FUSION  2007  . EYE SURGERY Bilateral    cataracts  . I & D EXTREMITY Right 09/15/2019   Procedure: IRRIGATION AND DEBRIDEMENT ELBOW;  Surgeon: Leandrew Koyanagi, MD;  Location: [place];  Service: Orthopedics;  Laterality: Right;  . IRRIGATION AND DEBRIDEMENT ELBOW Right 12/11/2019   Procedure: RIGHT ELBOW IRRIGATION AND DEBRIDEMENT;  Surgeon: Leandrew Koyanagi, MD;  Location: [place];  Service: Orthopedics;  Laterality: Right;  . ORIF HUMERUS FRACTURE Right 08/25/2019   Procedure: OPEN REDUCTION INTERNAL FIXATION (ORIF) RIGHT SUPRACONDYLAR HUMERUS FRACTURE, NEUROLYSIS ULNAR NERVE;  Surgeon: Leandrew Koyanagi, MD;  Location: [place];  Service: Orthopedics;  Laterality: Right;  . ORIF PATELLA Right 07/07/2016   Procedure: Partial Patellectomy;  Surgeon: Leandrew Koyanagi, MD;  Location: [place];  Service: Orthopedics;  Laterality: Right;    There were no vitals  filed for this visit.   Subjective Assessment - 02/24/20 1258    Subjective  Pt is going to return to Dr. Erlinda Hong in 2 months    Pertinent History ORIF Rt supracondylar humerus fx 08/19/19, I & D 09/14/19 and 12/18/19.    Patient Stated Goals be able to use arm more, be able to use my cane and not need walker (was using cane in R hand prior), be able to go upstairs again    Currently in Pain? Yes    Pain Score 2     Pain Location Arm    Pain Orientation Right    Pain Descriptors / Indicators Aching    Pain Type Chronic pain    Pain Onset More than a month ago    Pain Frequency Intermittent    Aggravating Factors  P/ROM, cold weather    Pain Relieving Factors pain meds                    Treatment: Performed self P/ROM  elbow flexion and extension.  Cane exercises for shoulder flexion/ extension, closed chain elbow flexion/ extension. Progressed to gentle strengthening in elbow flex and ext with 1 lb weight, 10 reps each.(attempted with 2 lbs and this was too challenging) Supination/ pronation with light hammer x 10 reps min v.c for positioning. UBE  x 6 minutes for shoulder and elbow ROM and UB conditioning.  Graded clothespins for functional reach yellow-green seated and standing min v.c for positioning Gripper set at level 1  To pick up 1 inch blocks , min difficulty, 2-3 rest breaks due to fatigue.             OT Short Term Goals - 02/12/20 1201      OT SHORT TERM GOAL #1   Title Pt will be independent with initial HEP.--check STGs 02/23/20    Time 6    Period Weeks    Status Achieved      OT SHORT TERM GOAL #2   Title Pt will demo at least -55* R elbow ext for functional reach.    Baseline -70*    Time 6    Period Weeks    Status On-going   02/12/20:  -65*     OT SHORT TERM GOAL #3   Title Pt will demo at least 135* R elbow flexion for incr ease with using RUE to eat/care for hair.    Baseline 125*    Time 12    Period Weeks    Status On-going   02/12/20:   130*     OT SHORT TERM GOAL #4   Title Pt will demo at least 100* R shoulder flexion for functional reach    Baseline 90*    Time 6    Period Weeks    Status On-going      OT SHORT TERM GOAL #5   Title Pt will improve R grip strength by at least 5lbs to assist with lifting/opening containers.    Time 6    Period Weeks    Status New      OT SHORT TERM GOAL #6   Title Pt will be independent with edema management and desensitization techniques.    Time 6    Period Weeks    Status New             OT Long Term Goals - 01/12/20 1844      OT LONG TERM GOAL #1   Title Pt will be independent with updated HEP for strengthening.--check LTGs 04/11/20    Time 12    Period Weeks    Status New      OT LONG TERM GOAL #2   Title Pt will demo at least -45* R elbow ext for functional reach    Baseline -70*    Time 12    Period Weeks    Status New      OT LONG TERM GOAL #3   Title Pt will improve RUE strength be able to lift gallon of milk at midlevel to place in cart or pour.    Time 12    Period Weeks    Status New      OT LONG TERM GOAL #4   Title Pt will improve R grip strength by at least 10lbs for lifting.    Baseline 25lbs    Time 12    Period Weeks    Status New      OT LONG TERM GOAL #5   Title Pt will demo at least 110* R shoulder flex for functional reaching and incr ease with hair care.    Baseline 90*    Time 12    Period Weeks    Status New      OT LONG TERM GOAL #6   Title Pt will be able to eat  with dominant RUE 100% of the time.    Baseline 50%    Time 12    Period Weeks    Status New                 Plan - 02/24/20 1306    Clinical Impression Statement Pt is progressing slowly towards goals. Pt will return to MD in 2 months to see if infection has resolved . Pt has continued redness at elbow.    OT Occupational Profile and History Detailed Assessment- Review of Records and additional review of physical, cognitive, psychosocial history related  to current functional performance    Occupational performance deficits (Please refer to evaluation for details): ADL's;IADL's    Body Structure / Function / Physical Skills Balance;Mobility;Skin integrity;Strength;Edema;ADL;UE functional use;Pain;ROM;Sensation;IADL    Rehab Potential Fair    Clinical Decision Making Several treatment options, min-mod task modification necessary    OT Frequency 1x / week    OT Duration 12 weeks    OT Treatment/Interventions Therapeutic exercise;Functional Mobility Training;Self-care/ADL training;Ultrasound;Neuromuscular education;Manual Therapy;Splinting;Therapeutic activities;Cryotherapy;DME and/or AE instruction;Scar mobilization;Electrical Stimulation;Moist Heat;Contrast Bath;Passive range of motion;Patient/family education    Plan A/ROM, P/ROM, UBE, gentle strengthening    Recommended Other Services physical therapy due to hx of falls, and decr functional mobility (unable to go upstairs)           Patient will benefit from skilled therapeutic intervention in order to improve the following deficits and impairments:   Body Structure / Function / Physical Skills: Balance, Mobility, Skin integrity, Strength, Edema, ADL, UE functional use, Pain, ROM, Sensation, IADL       Visit Diagnosis: Muscle weakness (generalized)  Stiffness of right elbow, not elsewhere classified  Other symptoms and signs involving the nervous system  Stiffness of right shoulder, not elsewhere classified    Problem List Patient Active Problem List   Diagnosis Date Noted  . Nonproliferative diabetic retinopathy of both eyes ([place]) 01/06/2020  . Degenerative retinal drusen of both eyes 01/06/2020  . Posterior vitreous detachment of left eye 01/06/2020  . Early stage nonexudative age-related macular degeneration of both eyes 01/06/2020  . Postoperative infection   . Abscess of right upper arm and forearm 09/15/2019  . Cellulitis of right upper extremity 09/14/2019  .  Insulin-requiring or dependent type II diabetes mellitus ([place]) 09/14/2019  . Hyponatremia   . Right supracondylar humerus fracture, closed, initial encounter 08/20/2019  . Abnormal EKG 08/10/2016  . Hypertension 08/10/2016    Allysia Ingles 02/24/2020, 1:11 PM Theone Murdoch, OTR/L [phone] Phone: [phone] 1:12 PM 02/24/20 [facility] [address] [place], [place], [address] Phone: [phone]   Fax:  [phone]  Name: Kayla Griffin MRN: [id number] Date of Birth: 1940/10/22

## 2020-03-02 ENCOUNTER — Ambulatory Visit: Payer: Medicare HMO

## 2020-03-02 ENCOUNTER — Ambulatory Visit: Payer: Medicare HMO | Admitting: Occupational Therapy

## 2020-03-02 ENCOUNTER — Other Ambulatory Visit: Payer: Self-pay

## 2020-03-02 ENCOUNTER — Encounter: Payer: Self-pay | Admitting: Occupational Therapy

## 2020-03-02 DIAGNOSIS — R2681 Unsteadiness on feet: Secondary | ICD-10-CM

## 2020-03-02 DIAGNOSIS — M6281 Muscle weakness (generalized): Secondary | ICD-10-CM

## 2020-03-02 DIAGNOSIS — M25611 Stiffness of right shoulder, not elsewhere classified: Secondary | ICD-10-CM | POA: Diagnosis not present

## 2020-03-02 DIAGNOSIS — R2689 Other abnormalities of gait and mobility: Secondary | ICD-10-CM | POA: Diagnosis not present

## 2020-03-02 DIAGNOSIS — R600 Localized edema: Secondary | ICD-10-CM | POA: Diagnosis not present

## 2020-03-02 DIAGNOSIS — M25621 Stiffness of right elbow, not elsewhere classified: Secondary | ICD-10-CM

## 2020-03-02 DIAGNOSIS — R293 Abnormal posture: Secondary | ICD-10-CM

## 2020-03-02 DIAGNOSIS — R29818 Other symptoms and signs involving the nervous system: Secondary | ICD-10-CM

## 2020-03-02 NOTE — Therapy (Signed)
[facility] [address] [place], [place], [address] Phone: [phone]   Fax:  [phone]  Occupational Therapy Treatment  Patient Details  Name: Kayla Griffin MRN: [id number] Date of Birth: [birth year] Referring Provider (OT): Dr. Frankey Shown   Encounter Date: 03/02/2020   OT End of Session - 03/02/20 1125    Visit Number 7    Number of Visits 13    Date for OT Re-Evaluation 04/11/20    Authorization Type [employer]    Authorization - Visit Number 7    Authorization - Number of Visits 10    OT Start Time 1102    OT Stop Time 1145    OT Time Calculation (min) 43 min           Past Medical History:  Diagnosis Date  . Ambulates with cane   . Arthritis   . Cancer [facility])    Endometrial - hysterectomy  . Depression   . Diabetes mellitus without complication ([place])    Type II  . Dyspnea    currently with arm pain 08/22/19  . Glaucoma   . HLD (hyperlipidemia)   . Hypertension   . Wears dentures     Past Surgical History:  Procedure Laterality Date  . ABDOMINAL HYSTERECTOMY    . CERVICAL FUSION  2007  . EYE SURGERY Bilateral    cataracts  . I & D EXTREMITY Right 09/15/2019   Procedure: IRRIGATION AND DEBRIDEMENT ELBOW;  Surgeon: Leandrew Koyanagi, MD;  Location: [facility];  Service: Orthopedics;  Laterality: Right;  . IRRIGATION AND DEBRIDEMENT ELBOW Right 12/11/2019   Procedure: RIGHT ELBOW IRRIGATION AND DEBRIDEMENT;  Surgeon: Leandrew Koyanagi, MD;  Location: [facility];  Service: Orthopedics;  Laterality: Right;  . ORIF HUMERUS FRACTURE Right 08/25/2019   Procedure: OPEN REDUCTION INTERNAL FIXATION (ORIF) RIGHT SUPRACONDYLAR HUMERUS FRACTURE, NEUROLYSIS ULNAR NERVE;  Surgeon: Leandrew Koyanagi, MD;  Location: [place];  Service: Orthopedics;  Laterality: Right;  . ORIF PATELLA Right 07/07/2016   Procedure: Partial Patellectomy;  Surgeon: Leandrew Koyanagi, MD;  Location: [place];  Service: Orthopedics;   Laterality: Right;    There were no vitals filed for this visit.   Subjective Assessment - 03/02/20 1101    Subjective  Pt reports she was able to climb stairs    Pertinent History ORIF Rt supracondylar humerus fx 08/19/19, I & D 09/14/19 and 12/18/19.    Patient Stated Goals be able to use arm more, be able to use my cane and not need walker (was using cane in R hand prior), be able to go upstairs again    Currently in Pain? Yes    Pain Score 4     Pain Location Knee    Pain Orientation Right;Left    Pain Descriptors / Indicators Aching    Pain Type Chronic pain    Pain Onset More than a month ago    Pain Frequency Intermittent    Aggravating Factors  cold weather    Pain Relieving Factors pain meds                 Treatment: Cane exercises for shoulder flexion/ extension, shoulder abduction,  closed chain elbow flexion/ extension, min v.c 10- reps each Progressed to gentle strengthening in elbow flex and ext with 1 lb weight, 10 reps each. Supination/ pronation with light hammer x 10 reps min v.c for positioning. Placing grooved pegs in pegboard with RUE, for increased RUE  functional use, removing with tweezers for improved sustained pinch, min difficulty, min v.c for positioning UBE x 6 minutes for shoulder and elbow ROM and UB conditioning.  Graded clothespins for functional reach 1-8 lbs in min v.c for positioning in standing. Gripper set at level 1 for sustained grip, min difficulty, several rest breaks required due to fatigue                 OT Short Term Goals - 02/12/20 1201      OT SHORT TERM GOAL #1   Title Pt will be independent with initial HEP.--check STGs 02/23/20    Time 6    Period Weeks    Status Achieved      OT SHORT TERM GOAL #2   Title Pt will demo at least -55* R elbow ext for functional reach.    Baseline -70*    Time 6    Period Weeks    Status On-going   02/12/20:  -65*     OT SHORT TERM GOAL #3   Title Pt will demo at least 135*  R elbow flexion for incr ease with using RUE to eat/care for hair.    Baseline 125*    Time 12    Period Weeks    Status On-going   02/12/20:  130*     OT SHORT TERM GOAL #4   Title Pt will demo at least 100* R shoulder flexion for functional reach    Baseline 90*    Time 6    Period Weeks    Status On-going      OT SHORT TERM GOAL #5   Title Pt will improve R grip strength by at least 5lbs to assist with lifting/opening containers.    Time 6    Period Weeks    Status New      OT SHORT TERM GOAL #6   Title Pt will be independent with edema management and desensitization techniques.    Time 6    Period Weeks    Status New             OT Long Term Goals - 01/12/20 1844      OT LONG TERM GOAL #1   Title Pt will be independent with updated HEP for strengthening.--check LTGs 04/11/20    Time 12    Period Weeks    Status New      OT LONG TERM GOAL #2   Title Pt will demo at least -45* R elbow ext for functional reach    Baseline -70*    Time 12    Period Weeks    Status New      OT LONG TERM GOAL #3   Title Pt will improve RUE strength be able to lift gallon of milk at midlevel to place in cart or pour.    Time 12    Period Weeks    Status New      OT LONG TERM GOAL #4   Title Pt will improve R grip strength by at least 10lbs for lifting.    Baseline 25lbs    Time 12    Period Weeks    Status New      OT LONG TERM GOAL #5   Title Pt will demo at least 110* R shoulder flex for functional reaching and incr ease with hair care.    Baseline 90*    Time 12    Period Weeks    Status New  OT LONG TERM GOAL #6   Title Pt will be able to eat with dominant RUE 100% of the time.    Baseline 50%    Time 12    Period Weeks    Status New                  Patient will benefit from skilled therapeutic intervention in order to improve the following deficits and impairments:           Visit Diagnosis: Muscle weakness (generalized)  Stiffness of  right elbow, not elsewhere classified  Other symptoms and signs involving the nervous system  Stiffness of right shoulder, not elsewhere classified    Problem List Patient Active Problem List   Diagnosis Date Noted  . Nonproliferative diabetic retinopathy of both eyes ([place]) 01/06/2020  . Degenerative retinal drusen of both eyes 01/06/2020  . Posterior vitreous detachment of left eye 01/06/2020  . Early stage nonexudative age-related macular degeneration of both eyes 01/06/2020  . Postoperative infection   . Abscess of right upper arm and forearm 09/15/2019  . Cellulitis of right upper extremity 09/14/2019  . Insulin-requiring or dependent type II diabetes mellitus ([place]) 09/14/2019  . Hyponatremia   . Right supracondylar humerus fracture, closed, initial encounter 08/20/2019  . Abnormal EKG 08/10/2016  . Hypertension 08/10/2016    Minerva Bluett 03/02/2020, 11:32 AM  [facility] [address] [place], [place], [address] Phone: [phone]   Fax:  [phone]  Name: Kayla Griffin MRN: [id number] Date of Birth: Apr 18, 1940

## 2020-03-02 NOTE — Therapy (Signed)
[facility] [address] [place], [place], [address] Phone: [phone]   Fax:  [phone]  Physical Therapy Treatment  Patient Details  Name: Kayla Griffin MRN: [id number] Date of Birth: 1940/11/09 Referring Provider (PT): Dr. Erlinda Hong   Encounter Date: 03/02/2020   PT End of Session - 03/02/20 1027    Visit Number 4    Number of Visits 9   pt not able to afford twice a week   Date for PT Re-Evaluation 05/05/20    Authorization Type A[employer]na Medicare (PN every 10th visit)    PT Start Time 1018    PT Stop Time 1100    PT Time Calculation (min) 42 min    Equipment Utilized During Treatment Gait belt    Activity Tolerance Patient tolerated treatment well    Behavior During Therapy [facility] for tasks assessed/performed           Past Medical History:  Diagnosis Date  . Ambulates with cane   . Arthritis   . Cancer [facility])    Endometrial - hysterectomy  . Depression   . Diabetes mellitus without complication ([place])    Type II  . Dyspnea    currently with arm pain 08/22/19  . Glaucoma   . HLD (hyperlipidemia)   . Hypertension   . Wears dentures     Past Surgical History:  Procedure Laterality Date  . ABDOMINAL HYSTERECTOMY    . CERVICAL FUSION  2007  . EYE SURGERY Bilateral    cataracts  . I & D EXTREMITY Right 09/15/2019   Procedure: IRRIGATION AND DEBRIDEMENT ELBOW;  Surgeon: Leandrew Koyanagi, MD;  Location: [facility];  Service: Orthopedics;  Laterality: Right;  . IRRIGATION AND DEBRIDEMENT ELBOW Right 12/11/2019   Procedure: RIGHT ELBOW IRRIGATION AND DEBRIDEMENT;  Surgeon: Leandrew Koyanagi, MD;  Location: [facility];  Service: Orthopedics;  Laterality: Right;  . ORIF HUMERUS FRACTURE Right 08/25/2019   Procedure: OPEN REDUCTION INTERNAL FIXATION (ORIF) RIGHT SUPRACONDYLAR HUMERUS FRACTURE, NEUROLYSIS ULNAR NERVE;  Surgeon: Leandrew Koyanagi, MD;  Location: [place];  Service: Orthopedics;  Laterality: Right;  . ORIF  PATELLA Right 07/07/2016   Procedure: Partial Patellectomy;  Surgeon: Leandrew Koyanagi, MD;  Location: [facility];  Service: Orthopedics;  Laterality: Right;    There were no vitals filed for this visit.   Subjective Assessment - 03/02/20 1023    Subjective Patient reports that she was able to make it up her entire flight of stairs. No falls to report. No pain.    Pertinent History ORIF humerus 08/2019 with I&D's for infection, HTN, DM, glaucoma, macular degeneration, diabetic retinopathy, arthritis, depression, HLD, hx of endometrial CA, abscess R RU, virteous detachment (L side)    Patient Stated Goals Be able to use SPC vs. RW, go up stairs    Currently in Pain? Yes    Pain Score 4     Pain Location Knee    Pain Orientation Left    Pain Descriptors / Indicators Aching    Pain Type Chronic pain    Pain Onset More than a month ago    Pain Frequency Intermittent    Aggravating Factors  cold weather    Pain Relieving Factors pain medication                             [facility] Adult PT Treatment/Exercise - 03/02/20 0001      Transfers  Transfers Sit to Stand;Stand to Sit    Sit to Stand 5: Supervision    Stand to Sit 5: Supervision    Comments supervision for safety with completion      Ambulation/Gait   Ambulation/Gait Yes    Ambulation/Gait Assistance 5: Supervision;4: Min guard    Ambulation/Gait Assistance Details completed ambulation with small base quad cane, patient require CGA from PT. PT educating on proper adjustment of cane as patient reports that feels her cane was too high initially at home. Patient able to ambulate x 115 ft today during session then requiring seated rest break due to pain.     Ambulation Distance (Feet) 115 Feet    Assistive device Small based quad cane    Gait Pattern Step-through pattern;Decreased stride length;Decreased dorsiflexion - right;Decreased dorsiflexion - left    Ambulation Surface Level;Indoor    Gait velocity 17.22 secs = 1.90  ft/sec    Stairs Yes    Stairs Assistance 5: Supervision;4: Min guard    Stairs Assistance Details (indicate cue type and reason) completed ascend/descend stairs forward with step to pattern, and descend backwards. patient demo heavy reliance on UE support to ascend    Stair Management Technique Two rails;Step to pattern;Forwards;Backwards    Number of Stairs 4    Height of Stairs 6      High Level Balance   High Level Balance Activities Side stepping;Backward walking;Marching forwards    High Level Balance Comments at countertop: side stepping x 2 laps down and back, backwards walking x 2 laps down and back . With marching forward x 1 lap down and back, patient reporting feeling uncomfortable in knee so stopped completion. Patient requiring standing rest breaks intermittently throughout completion due to fatigue and pain in knes.       Neuro Re-ed    Neuro Re-ed Details  standing at countertop without UE support and narrow BOS, completed horiozntal/vertical head turns x 15 reps each. Progressed to completing narrow BOS and EC 3 x 30 seconds. Intermittent CGA required throughout with completion of balance. Intermittent rest breaks required.                   PT Education - 03/02/20 1107    Education Details Educated on progress toward LTGs    Person(s) Educated Patient    Methods Explanation    Comprehension Verbalized understanding            PT Short Term Goals - 03/02/20 1040      PT SHORT TERM GOAL #1   Title Pt will be IND with HEP to improve balance, strength, and endurance. TARGET DATE FOR ALL STGS: 03/04/20    Baseline Patient reports independence with HEP [facility])    Time 4    Period Weeks    Status Achieved      PT SHORT TERM GOAL #2   Title Perform BERG and write STG/LTGs as indicated.    Baseline 42/56    Time 4    Period Weeks    Status Achieved      PT SHORT TERM GOAL #3   Title Pt will amb. 200' over even terrain with SPC at MOD I level to negotiate  all areas of home safely.    Baseline 115 even terrain with SBQC Supervision/CGA    Time 4    Period Weeks    Status On-going      PT SHORT TERM GOAL #4   Title Pt will improve gait speed to >/=2.39ft/sec.  with LRAD to improve functional mobility and gait efficiency.    Baseline 1.52ft/sec and 1.19ft/sec with RW; 1.90 ft/sec    Time 4    Period Weeks    Status On-going             PT Long Term Goals - 03/02/20 1153      PT LONG TERM GOAL #1   Title Pt will improve TUG time with LRAD to 13.5sec to decr. falls risk.    Baseline 18.18 sec c RW    Time 8    Period Weeks    Status New      PT LONG TERM GOAL #2   Title Pt will improve gait speed to >/=2.19ft/sec with LRAD to safely amb. in the community.    Baseline 1.43ft/sec and 1.56ft/sec c RW    Time 8    Period Weeks    Status New      PT LONG TERM GOAL #3   Title Pt will amb. 500' over even and paved surfaces with LRAD to improve functional mobility.    Time 8    Period Weeks    Status New      PT LONG TERM GOAL #4   Title Perform DGI if applicable and write goal.    Time 8    Period Weeks    Status New      PT LONG TERM GOAL #5   Title Pt will traverse 16 steps with one handrail and SPC at MOD I level to safely traverse stairs at home.    Time 8    Period Weeks    Status New      Additional Long Term Goals   Additional Long Term Goals Yes      PT LONG TERM GOAL #6   Title Patient will improve Berg Balance to >/= 46/56 to demonstrate improved balance and reduced fall risk    Baseline 42/56    Time 8    Period Weeks    Status New                 Plan - 03/02/20 1149    Clinical Impression Statement Today's skilled PT session included assessment of progress toward STG. Paitent able to meet STG #1 and #2, patient also demonstrating improvements toward STG #3 and 4. Patient still demonstrating limited tolerance for ambulation due to increased pain in knees. Patient did improve gait speed to 1.90 ft/sec  with RW today during session. Continued balance exercises as tolerated by patient. Will continue per POC.    Personal Factors and Comorbidities Age;Comorbidity 3+;Finances    Comorbidities ORIF humerus 08/2019 with I&D's for infection, HTN, DM, glaucoma, macular degeneration, diabetic retinopathy, arthritis, depression, HLD, hx of endometrial CA, abscess R RU, virteous detachment (L side)    Examination-Activity Limitations Carry;Stand;Stairs;Squat;Transfers;Locomotion Level    Examination-Participation Restrictions Driving;Meal Prep;Cleaning;Laundry;Community Activity    Stability/Clinical Decision Making Evolving/Moderate complexity    Rehab Potential Good    PT Frequency 1x / week   pt not able to afford twice a week   PT Duration 8 weeks    PT Treatment/Interventions ADLs/Self Care Home Management;Aquatic Therapy;Biofeedback;Canalith Repostioning;Balance training;Therapeutic exercise;Manual techniques;Vestibular;Therapeutic activities;Functional mobility training;Orthotic Fit/Training;Stair training;Gait training;DME Instruction;Patient/family education;Neuromuscular re-education    PT Next Visit Plan Continue to review OTAGO and update/revise. Continue balance exercises, gait and stair instruction.    PT Home Exercise Plan OTAGO    Consulted and Agree with Plan of Care Patient  Patient will benefit from skilled therapeutic intervention in order to improve the following deficits and impairments:  Abnormal gait, Decreased endurance, Impaired sensation, Decreased knowledge of use of DME, Decreased balance, Decreased mobility, Decreased strength, Impaired UE functional use, Increased edema, Decreased range of motion, Impaired flexibility, Postural dysfunction  Visit Diagnosis: Muscle weakness (generalized)  Abnormal posture  Unsteadiness on feet  Other abnormalities of gait and mobility     Problem List Patient Active Problem List   Diagnosis Date Noted  . Nonproliferative  diabetic retinopathy of both eyes ([place]) 01/06/2020  . Degenerative retinal drusen of both eyes 01/06/2020  . Posterior vitreous detachment of left eye 01/06/2020  . Early stage nonexudative age-related macular degeneration of both eyes 01/06/2020  . Postoperative infection   . Abscess of right upper arm and forearm 09/15/2019  . Cellulitis of right upper extremity 09/14/2019  . Insulin-requiring or dependent type II diabetes mellitus ([place]) 09/14/2019  . Hyponatremia   . Right supracondylar humerus fracture, closed, initial encounter 08/20/2019  . Abnormal EKG 08/10/2016  . Hypertension 08/10/2016    Jones Bales, PT, DPT 03/02/2020, 11:54 AM  [facility] [address] [place], [place], [address] Phone: [phone]   Fax:  [phone]  Name: SHANDEE JERGENS MRN: [id number] Date of Birth: [birth year]

## 2020-03-09 ENCOUNTER — Encounter: Payer: Medicare HMO | Admitting: Occupational Therapy

## 2020-03-11 ENCOUNTER — Ambulatory Visit: Payer: Medicare HMO | Admitting: Occupational Therapy

## 2020-03-11 ENCOUNTER — Ambulatory Visit: Payer: Medicare HMO

## 2020-03-16 ENCOUNTER — Ambulatory Visit: Payer: Medicare HMO | Admitting: Occupational Therapy

## 2020-03-16 ENCOUNTER — Ambulatory Visit: Payer: Medicare HMO | Admitting: Physical Therapy

## 2020-03-25 ENCOUNTER — Other Ambulatory Visit: Payer: Self-pay

## 2020-03-25 ENCOUNTER — Ambulatory Visit: Payer: Medicare HMO | Attending: Orthopaedic Surgery

## 2020-03-25 DIAGNOSIS — R2681 Unsteadiness on feet: Secondary | ICD-10-CM | POA: Insufficient documentation

## 2020-03-25 DIAGNOSIS — M6281 Muscle weakness (generalized): Secondary | ICD-10-CM | POA: Diagnosis not present

## 2020-03-25 DIAGNOSIS — R2689 Other abnormalities of gait and mobility: Secondary | ICD-10-CM | POA: Diagnosis not present

## 2020-03-25 DIAGNOSIS — R293 Abnormal posture: Secondary | ICD-10-CM

## 2020-03-25 NOTE — Therapy (Signed)
[place] [address] [place], [place], [address] Phone: [phone]   Fax:  [phone]  Physical Therapy Treatment  Patient Details  Name: Kayla Griffin MRN: [id number] Date of Birth: 04-Jul-1940 Referring Provider (PT): Dr. Erlinda Hong   Encounter Date: 03/25/2020   PT End of Session - 03/25/20 1253    Visit Number 5    Number of Visits 9    Date for PT Re-Evaluation 05/05/20    Authorization Type [employer] Medicare (PN every 10th visit)    PT Start Time 1152    PT Stop Time 1230    PT Time Calculation (min) 38 min    Equipment Utilized During Treatment Gait belt    Activity Tolerance Patient tolerated treatment well    Behavior During Therapy [facility] for tasks assessed/performed           Past Medical History:  Diagnosis Date  . Ambulates with cane   . Arthritis   . Cancer [facility])    Endometrial - hysterectomy  . Depression   . Diabetes mellitus without complication ([place])    Type II  . Dyspnea    currently with arm pain 08/22/19  . Glaucoma   . HLD (hyperlipidemia)   . Hypertension   . Wears dentures     Past Surgical History:  Procedure Laterality Date  . ABDOMINAL HYSTERECTOMY    . CERVICAL FUSION  2007  . EYE SURGERY Bilateral    cataracts  . I & D EXTREMITY Right 09/15/2019   Procedure: IRRIGATION AND DEBRIDEMENT ELBOW;  Surgeon: Leandrew Koyanagi, MD;  Location: [place];  Service: Orthopedics;  Laterality: Right;  . IRRIGATION AND DEBRIDEMENT ELBOW Right 12/11/2019   Procedure: RIGHT ELBOW IRRIGATION AND DEBRIDEMENT;  Surgeon: Leandrew Koyanagi, MD;  Location: [place];  Service: Orthopedics;  Laterality: Right;  . ORIF HUMERUS FRACTURE Right 08/25/2019   Procedure: OPEN REDUCTION INTERNAL FIXATION (ORIF) RIGHT SUPRACONDYLAR HUMERUS FRACTURE, NEUROLYSIS ULNAR NERVE;  Surgeon: Leandrew Koyanagi, MD;  Location: [place];  Service: Orthopedics;  Laterality: Right;  . ORIF PATELLA Right 07/07/2016   Procedure:  Partial Patellectomy;  Surgeon: Leandrew Koyanagi, MD;  Location: [place];  Service: Orthopedics;  Laterality: Right;    There were no vitals filed for this visit.   Subjective Assessment - 03/25/20 1157    Subjective Pt reported she had bronchitis and that's why she missed therapy. She is feeling better with energy slowly returning. Pt reported one fall since last visit on 03/02/20 because she stepped on cat's ball, and was walking without RW. It took the Psychologist, occupational. to help pt up, as her neighbor couldn't help pt up. Now pt always has RW close by. Pt denied injury.    Pertinent History ORIF humerus 08/2019 with I&D's for infection, HTN, DM, glaucoma, macular degeneration, diabetic retinopathy, arthritis, depression, HLD, hx of endometrial CA, abscess R RU, virteous detachment (L side)    Patient Stated Goals Be able to use SPC vs. RW, go up stairs    Currently in Pain? No/denies                             [facility] Adult PT Treatment/Exercise - 03/25/20 1200      Ambulation/Gait   Ambulation/Gait Yes    Ambulation/Gait Assistance 5: Supervision;4: Min guard    Ambulation/Gait Assistance Details Cues for L arm swing with SBQC vs. L hand on hip. S  to min guard for safety.    Ambulation Distance (Feet) 175 Feet   with SBQC and 66' and 115' with RW   Assistive device Small based quad cane;Rolling walker    Gait Pattern Step-through pattern;Decreased stride length;Decreased dorsiflexion - right;Decreased dorsiflexion - left    Ambulation Surface Level;Indoor    Gait velocity 1.62ft/sec with SBQC and 2.86ft/sec. with RW. SpO2 after gait with SBQC: 98%and SpO2 after gait with RW: 99% with pt reporting less SOB after RW gait training. HR range after gait: 74bpm-94bpm.      Standardized Balance Assessment   Standardized Balance Assessment Berg Balance Test      Berg Balance Test   Sit to Stand Able to stand without using hands and stabilize independently    Standing Unsupported Able to  stand safely 2 minutes    Sitting with Back Unsupported but Feet Supported on Floor or Stool Able to sit safely and securely 2 minutes    Stand to Sit Sits safely with minimal use of hands    Transfers Able to transfer safely, minor use of hands    Standing Unsupported with Eyes Closed Able to stand 10 seconds safely    Standing Ubsupported with Feet Together Able to place feet together independently and stand 1 minute safely    From Standing, Reach Forward with Outstretched Arm Can reach forward >12 cm safely (5")   5" with LUE, 3" with RUE   From Standing Position, Pick up Object from Floor Able to pick up shoe safely and easily    From Standing Position, Turn to Look Behind Over each Shoulder Looks behind one side only/other side shows less weight shift    Turn 360 Degrees Able to turn 360 degrees safely but slowly    Standing Unsupported, Alternately Place Feet on Step/Stool Able to complete >2 steps/needs minimal assist    Standing Unsupported, One Foot in Front Able to take small step independently and hold 30 seconds    Standing on One Leg Tries to lift leg/unable to hold 3 seconds but remains standing independently    Total Score 44                  PT Education - 03/25/20 1251    Education Details PT discussed outcome measure results and re-testing of STGs as pt missed several weeks of PT 2/2 bronchitis. PT requesting additional 1x/week for 4 weeks 2/2 missing appt's, pt unable to afford 2x/week. PT educated pt on using RW at all times for safety and that PT sessions will focus on gait with [facility]    Person(s) Educated Patient    Methods Explanation    Comprehension Verbalized understanding            PT Short Term Goals - 03/02/20 1040      PT SHORT TERM GOAL #1   Title Pt will be IND with HEP to improve balance, strength, and endurance. TARGET DATE FOR ALL STGS: 03/04/20    Baseline Patient reports independence with HEP [facility])    Time 4    Period Weeks    Status  Achieved      PT SHORT TERM GOAL #2   Title Perform BERG and write STG/LTGs as indicated.    Baseline 42/56    Time 4    Period Weeks    Status Achieved      PT SHORT TERM GOAL #3   Title Pt will amb. 200' over even terrain with SPC at MOD I  level to negotiate all areas of home safely.    Baseline 115 even terrain with SBQC Supervision/CGA    Time 4    Period Weeks    Status On-going      PT SHORT TERM GOAL #4   Title Pt will improve gait speed to >/=2.14ft/sec. with LRAD to improve functional mobility and gait efficiency.    Baseline 1.85ft/sec and 1.14ft/sec with RW; 1.90 ft/sec    Time 4    Period Weeks    Status On-going             PT Long Term Goals - 03/25/20 1304      PT LONG TERM GOAL #1   Title Pt will improve TUG time with LRAD to 13.5sec to decr. falls risk. TARGET DATE FOR ALL LTGS: 04/22/20    Baseline 18.18 sec c RW    Time 8    Period Weeks    Status New      PT LONG TERM GOAL #2   Title Pt will improve gait speed to >/=2.71ft/sec with LRAD to safely amb. in the community.    Baseline 1.54ft/sec and 1.25ft/sec c RW    Time 8    Period Weeks    Status New      PT LONG TERM GOAL #3   Title Pt will amb. 500' over even and paved surfaces with LRAD to improve functional mobility.    Time 8    Period Weeks    Status New      PT LONG TERM GOAL #4   Title Perform DGI if applicable and write goal.    Time 8    Period Weeks    Status New      PT LONG TERM GOAL #5   Title Pt will traverse 16 steps with one handrail and SPC at MOD I level to safely traverse stairs at home.    Time 8    Period Weeks    Status New      PT LONG TERM GOAL #6   Title Patient will improve Berg Balance to >/= 46/56 to demonstrate improved balance and reduced fall risk    Baseline 42/56    Time 8    Period Weeks    Status New                 Plan - 03/25/20 1301    Clinical Impression Statement PT reviewed pt's STGs as had not been to therapy since 03/02/20 2/2  bronchitis. Pt's BERG score improved but still indicated pt is at significant risk for falls. Pt's gait speed improved with RW. Pt continues to experience fatigue and rest breaks and incr. postural sway amb. with SBQC. Pt would continue to benefit from skilled PT to improve safety during functional mobility. LTG date revised to 04/22/20.    Personal Factors and Comorbidities Age;Comorbidity 3+;Finances    Comorbidities ORIF humerus 08/2019 with I&D's for infection, HTN, DM, glaucoma, macular degeneration, diabetic retinopathy, arthritis, depression, HLD, hx of endometrial CA, abscess R RU, virteous detachment (L side)    Examination-Activity Limitations Carry;Stand;Stairs;Squat;Transfers;Locomotion Level    Examination-Participation Restrictions Driving;Meal Prep;Cleaning;Laundry;Community Activity    Stability/Clinical Decision Making Evolving/Moderate complexity    Rehab Potential Good    PT Frequency 1x / week   pt not able to afford twice a week   PT Duration 8 weeks    PT Treatment/Interventions ADLs/Self Care Home Management;Aquatic Therapy;Biofeedback;Canalith Repostioning;Balance training;Therapeutic exercise;Manual techniques;Vestibular;Therapeutic activities;Functional mobility training;Orthotic Fit/Training;Stair training;Gait training;DME Instruction;Patient/family education;Neuromuscular  re-education    PT Next Visit Plan Continue to review OTAGO and update/revise. Continue balance exercises, gait and stair instruction.    PT Home Exercise Plan OTAGO    Consulted and Agree with Plan of Care Patient           Patient will benefit from skilled therapeutic intervention in order to improve the following deficits and impairments:  Abnormal gait,Decreased endurance,Impaired sensation,Decreased knowledge of use of DME,Decreased balance,Decreased mobility,Decreased strength,Impaired UE functional use,Increased edema,Decreased range of motion,Impaired flexibility,Postural dysfunction  Visit  Diagnosis: Muscle weakness (generalized)  Unsteadiness on feet  Other abnormalities of gait and mobility  Abnormal posture     Problem List Patient Active Problem List   Diagnosis Date Noted  . Nonproliferative diabetic retinopathy of both eyes ([place]) 01/06/2020  . Degenerative retinal drusen of both eyes 01/06/2020  . Posterior vitreous detachment of left eye 01/06/2020  . Early stage nonexudative age-related macular degeneration of both eyes 01/06/2020  . Postoperative infection   . Abscess of right upper arm and forearm 09/15/2019  . Cellulitis of right upper extremity 09/14/2019  . Insulin-requiring or dependent type II diabetes mellitus ([place]) 09/14/2019  . Hyponatremia   . Right supracondylar humerus fracture, closed, initial encounter 08/20/2019  . Abnormal EKG 08/10/2016  . Hypertension 08/10/2016    Avereigh Spainhower L 03/25/2020, 1:07 PM  [place] [address] [place], [place], [address] Phone: [phone]   Fax:  [phone]  Name: EIKO MCGOWEN MRN: [id number] Date of Birth: 08/05/1940  Geoffry Paradise, PT,DPT 03/25/20 1:08 PM Phone: [phone] Fax: [phone]

## 2020-03-30 ENCOUNTER — Ambulatory Visit: Payer: Medicare HMO | Admitting: Physical Therapy

## 2020-03-30 ENCOUNTER — Encounter: Payer: Self-pay | Admitting: Physical Therapy

## 2020-03-30 ENCOUNTER — Other Ambulatory Visit: Payer: Self-pay

## 2020-03-30 DIAGNOSIS — R293 Abnormal posture: Secondary | ICD-10-CM | POA: Diagnosis not present

## 2020-03-30 DIAGNOSIS — R2681 Unsteadiness on feet: Secondary | ICD-10-CM

## 2020-03-30 DIAGNOSIS — R2689 Other abnormalities of gait and mobility: Secondary | ICD-10-CM | POA: Diagnosis not present

## 2020-03-30 DIAGNOSIS — M6281 Muscle weakness (generalized): Secondary | ICD-10-CM | POA: Diagnosis not present

## 2020-03-30 NOTE — Therapy (Signed)
[facility] [address] [place], [place], [address] Phone: [phone]   Fax:  [phone]  Physical Therapy Treatment  Patient Details  Name: Kayla Griffin MRN: [id number] Date of Birth: October 18, 1940 Referring Provider (PT): Dr. Erlinda Hong   Encounter Date: 03/30/2020   PT End of Session - 03/30/20 1546    Visit Number 6    Number of Visits 9    Date for PT Re-Evaluation 05/05/20    Authorization Type Aetna Medicare (PN every 10th visit)    PT Start Time 1147    PT Stop Time 1228    PT Time Calculation (min) 41 min    Activity Tolerance Patient tolerated treatment well    Behavior During Therapy [facility] for tasks assessed/performed           Past Medical History:  Diagnosis Date  . Ambulates with cane   . Arthritis   . Cancer [facility])    Endometrial - hysterectomy  . Depression   . Diabetes mellitus without complication ([place])    Type II  . Dyspnea    currently with arm pain 08/22/19  . Glaucoma   . HLD (hyperlipidemia)   . Hypertension   . Wears dentures     Past Surgical History:  Procedure Laterality Date  . ABDOMINAL HYSTERECTOMY    . CERVICAL FUSION  2007  . EYE SURGERY Bilateral    cataracts  . I & D EXTREMITY Right 09/15/2019   Procedure: IRRIGATION AND DEBRIDEMENT ELBOW;  Surgeon: Leandrew Koyanagi, MD;  Location: [place];  Service: Orthopedics;  Laterality: Right;  . IRRIGATION AND DEBRIDEMENT ELBOW Right 12/11/2019   Procedure: RIGHT ELBOW IRRIGATION AND DEBRIDEMENT;  Surgeon: Leandrew Koyanagi, MD;  Location: [place];  Service: Orthopedics;  Laterality: Right;  . ORIF HUMERUS FRACTURE Right 08/25/2019   Procedure: OPEN REDUCTION INTERNAL FIXATION (ORIF) RIGHT SUPRACONDYLAR HUMERUS FRACTURE, NEUROLYSIS ULNAR NERVE;  Surgeon: Leandrew Koyanagi, MD;  Location: [place];  Service: Orthopedics;  Laterality: Right;  . ORIF PATELLA Right 07/07/2016   Procedure: Partial Patellectomy;  Surgeon: Leandrew Koyanagi, MD;   Location: [place];  Service: Orthopedics;  Laterality: Right;    There were no vitals filed for this visit.   Subjective Assessment - 03/30/20 1149    Subjective Pt states she turned or twisted wrong yesterday and now her hips are hurting - thinks she may have pinched a nerve in her back.  States there is no need to contact her MD, "this has happened before" - "I just keep moving"    Pertinent History ORIF humerus 08/2019 with I&D's for infection, HTN, DM, glaucoma, macular degeneration, diabetic retinopathy, arthritis, depression, HLD, hx of endometrial CA, abscess R RU, virteous detachment (L side)    Patient Stated Goals Be able to use SPC vs. RW, go up stairs    Currently in Pain? --   pain only occurs with certain movements                            [facility] Adult PT Treatment/Exercise - 03/30/20 1200      Transfers   Transfers Sit to Stand;Stand to Sit    Sit to Stand 5: Supervision    Stand to Sit 5: Supervision    Number of Reps Other reps (comment)   4-5 reps during session     Ambulation/Gait   Ambulation/Gait Yes    Ambulation/Gait Assistance 4: Min  guard    Ambulation Distance (Feet) 115 Feet    Assistive device Small based quad cane    Gait Pattern Step-through pattern    Ambulation Surface Level;Indoor    Stairs Yes    Stairs Assistance 5: Supervision    Stairs Assistance Details (indicate cue type and reason) pt ascends steps forward, descends backwards; pulls excessively with bil. UE's on rails    Stair Management Technique Two rails;Step to pattern;Backwards;Forwards    Number of Stairs 4    Height of Stairs 6    Gait Comments pt states she prefers the [facility] at high height - did not want cane lowered      Knee/Hip Exercises: Aerobic   Recumbent Bike SciFit level 2.0 with UE's and LE's 5"               Balance Exercises - 03/30/20 1227      Balance Exercises: Standing   Balance Beam standing on blue balance beam inside // bars with bil. UE  support; pt able to maintain balance with CGA for safety; pt performed horizontal head turns 5 reps with min assist for recovery of LOB    Heel Raises Both;10 reps;Other (comment)   with bil. UE support in // bars   Other Standing Exercises Pt performed forward kicks alternating legs 5 reps each leg due to c/o hip discomfort with standing    Other Standing Exercises Comments pt performed stepping over and back of black balance beam with UE support on // bars 5 reps each leg               PT Short Term Goals - 03/30/20 1557      PT SHORT TERM GOAL #1   Title Pt will be IND with HEP to improve balance, strength, and endurance. TARGET DATE FOR ALL STGS: 03/04/20    Baseline Patient reports independence with HEP [facility])    Time 4    Period Weeks    Status Achieved      PT SHORT TERM GOAL #2   Title Perform BERG and write STG/LTGs as indicated.    Baseline 42/56    Time 4    Period Weeks    Status Achieved      PT SHORT TERM GOAL #3   Title Pt will amb. 200' over even terrain with SPC at MOD I level to negotiate all areas of home safely.    Baseline 115 even terrain with SBQC Supervision/CGA    Time 4    Period Weeks    Status On-going      PT SHORT TERM GOAL #4   Title Pt will improve gait speed to >/=2.40ft/sec. with LRAD to improve functional mobility and gait efficiency.    Baseline 1.48ft/sec and 1.54ft/sec with RW; 1.90 ft/sec    Time 4    Period Weeks    Status On-going             PT Long Term Goals - 03/30/20 1557      PT LONG TERM GOAL #1   Title Pt will improve TUG time with LRAD to 13.5sec to decr. falls risk. TARGET DATE FOR ALL LTGS: 04/22/20    Baseline 18.18 sec c RW    Time 8    Period Weeks    Status New      PT LONG TERM GOAL #2   Title Pt will improve gait speed to >/=2.76ft/sec with LRAD to safely amb. in the community.    Baseline  1.16ft/sec and 1.54ft/sec c RW    Time 8    Period Weeks    Status New      PT LONG TERM GOAL #3   Title Pt  will amb. 500' over even and paved surfaces with LRAD to improve functional mobility.    Time 8    Period Weeks    Status New      PT LONG TERM GOAL #4   Title Perform DGI if applicable and write goal.    Time 8    Period Weeks    Status New      PT LONG TERM GOAL #5   Title Pt will traverse 16 steps with one handrail and SPC at MOD I level to safely traverse stairs at home.    Time 8    Period Weeks    Status New      PT LONG TERM GOAL #6   Title Patient will improve Berg Balance to >/= 46/56 to demonstrate improved balance and reduced fall risk    Baseline 42/56    Time 8    Period Weeks    Status New                 Plan - 03/30/20 1153    Clinical Impression Statement Pt's gait speed with SBQC was decreased but increased as pt gained confidence with use of this device and gait noted to improve with practice and repetition, but pt continued to have some mild unsteadiness with this device.  Pt required bil UE support with balance activities incorporating SLS, stating need to hold with both hands.  Cont with POC.    Personal Factors and Comorbidities Age;Comorbidity 3+;Finances    Comorbidities ORIF humerus 08/2019 with I&D's for infection, HTN, DM, glaucoma, macular degeneration, diabetic retinopathy, arthritis, depression, HLD, hx of endometrial CA, abscess R RU, virteous detachment (L side)    Examination-Activity Limitations Carry;Stand;Stairs;Squat;Transfers;Locomotion Level    Examination-Participation Restrictions Driving;Meal Prep;Cleaning;Laundry;Community Activity    Stability/Clinical Decision Making Evolving/Moderate complexity    Rehab Potential Good    PT Frequency 1x / week   pt not able to afford twice a week   PT Duration 8 weeks    PT Treatment/Interventions ADLs/Self Care Home Management;Aquatic Therapy;Biofeedback;Canalith Repostioning;Balance training;Therapeutic exercise;Manual techniques;Vestibular;Therapeutic activities;Functional mobility  training;Orthotic Fit/Training;Stair training;Gait training;DME Instruction;Patient/family education;Neuromuscular re-education    PT Next Visit Plan schedule more PT appts -  Continue to review OTAGO and update/revise. Continue balance exercises, gait and stair instruction.    PT Home Exercise Plan OTAGO    Consulted and Agree with Plan of Care Patient           Patient will benefit from skilled therapeutic intervention in order to improve the following deficits and impairments:  Abnormal gait,Decreased endurance,Impaired sensation,Decreased knowledge of use of DME,Decreased balance,Decreased mobility,Decreased strength,Impaired UE functional use,Increased edema,Decreased range of motion,Impaired flexibility,Postural dysfunction  Visit Diagnosis: Muscle weakness (generalized)  Unsteadiness on feet  Other abnormalities of gait and mobility     Problem List Patient Active Problem List   Diagnosis Date Noted  . Nonproliferative diabetic retinopathy of both eyes ([place]) 01/06/2020  . Degenerative retinal drusen of both eyes 01/06/2020  . Posterior vitreous detachment of left eye 01/06/2020  . Early stage nonexudative age-related macular degeneration of both eyes 01/06/2020  . Postoperative infection   . Abscess of right upper arm and forearm 09/15/2019  . Cellulitis of right upper extremity 09/14/2019  . Insulin-requiring or dependent type II diabetes mellitus ([place]) 09/14/2019  .  Hyponatremia   . Right supracondylar humerus fracture, closed, initial encounter 08/20/2019  . Abnormal EKG 08/10/2016  . Hypertension 08/10/2016    Alda Lea, PT 03/30/2020, 3:59 PM  [facility] [address] [place], [place], [address] Phone: [phone]   Fax:  [phone]  Name: Kayla Griffin MRN: [id number] Date of Birth: [birth year]

## 2020-04-08 ENCOUNTER — Other Ambulatory Visit: Payer: Self-pay

## 2020-04-08 ENCOUNTER — Ambulatory Visit: Payer: Medicare HMO

## 2020-04-08 DIAGNOSIS — M6281 Muscle weakness (generalized): Secondary | ICD-10-CM | POA: Diagnosis not present

## 2020-04-08 DIAGNOSIS — R2681 Unsteadiness on feet: Secondary | ICD-10-CM

## 2020-04-08 DIAGNOSIS — R293 Abnormal posture: Secondary | ICD-10-CM | POA: Diagnosis not present

## 2020-04-08 DIAGNOSIS — R2689 Other abnormalities of gait and mobility: Secondary | ICD-10-CM

## 2020-04-08 NOTE — Therapy (Signed)
[facility] [address] [place], [place], [address] Phone: [phone]   Fax:  [phone]  Physical Therapy Treatment  Patient Details  Name: Kayla Griffin MRN: [id number] Date of Birth: 20-Apr-1940 Referring Provider (PT): Dr. Roda Shutters   Encounter Date: 04/08/2020   PT End of Session - 04/08/20 1308    Visit Number 7    Number of Visits 9    Date for PT Re-Evaluation 05/05/20    Authorization Type [employer] Medicare (PN every 10th visit)    PT Start Time 1155    PT Stop Time 1233    PT Time Calculation (min) 38 min    Equipment Utilized During Treatment Other (comment)   S prn   Activity Tolerance Patient tolerated treatment well    Behavior During Therapy [facility] for tasks assessed/performed           Past Medical History:  Diagnosis Date  . Ambulates with cane   . Arthritis   . Cancer [facility])    Endometrial - hysterectomy  . Depression   . Diabetes mellitus without complication (HCC)    Type II  . Dyspnea    currently with arm pain 08/22/19  . Glaucoma   . HLD (hyperlipidemia)   . Hypertension   . Wears dentures     Past Surgical History:  Procedure Laterality Date  . ABDOMINAL HYSTERECTOMY    . CERVICAL FUSION  2007  . EYE SURGERY Bilateral    cataracts  . I & D EXTREMITY Right 09/15/2019   Procedure: IRRIGATION AND DEBRIDEMENT ELBOW;  Surgeon: Tarry Kos, MD;  [facility];  Service: Orthopedics;  Laterality: Right;  . IRRIGATION AND DEBRIDEMENT ELBOW Right 12/11/2019   Procedure: RIGHT ELBOW IRRIGATION AND DEBRIDEMENT;  Surgeon: Tarry Kos, MD;  Location: [facility];  Service: Orthopedics;  Laterality: Right;  . ORIF HUMERUS FRACTURE Right 08/25/2019   Procedure: OPEN REDUCTION INTERNAL FIXATION (ORIF) RIGHT SUPRACONDYLAR HUMERUS FRACTURE, NEUROLYSIS ULNAR NERVE;  Surgeon: Tarry Kos, MD;  [facility]: [facility];  Service: Orthopedics;  Laterality: Right;  . ORIF PATELLA Right 07/07/2016    Procedure: Partial Patellectomy;  Surgeon: Tarry Kos, MD;  [facility];  Service: Orthopedics;  Laterality: Right;    There were no vitals filed for this visit.   Subjective Assessment - 04/08/20 1159    Subjective Pt denied falls or changes since last visit. Pt was able to go up stairs (step-to pattern) on Sunday.    Pertinent History ORIF humerus 08/2019 with I&D's for infection, HTN, DM, glaucoma, macular degeneration, diabetic retinopathy, arthritis, depression, HLD, hx of endometrial CA, abscess R RU, virteous detachment (L side)    Patient Stated Goals Be able to use SPC vs. RW, go up stairs    Currently in Pain? No/denies   but pt reported L hip pain if staying in one position for prolonged periods of time   Pain Score 0-No pain    Pain Location Hip                             [facility] Adult PT Treatment/Exercise - 04/08/20 1203      Ambulation/Gait   Ambulation/Gait Yes    Ambulation/Gait Assistance 5: Supervision    Ambulation/Gait Assistance Details PT lowered quad cane, as pt prefers it higher. Cues for improved stride length. Pt's SpO2 after amb. 97% and HR: 70bpm. Pt required seated rest break 2/2  hip pain and fatigue.    Ambulation Distance (Feet) 115 Feet   x2 and 100' x1 rep   Assistive device Small based quad cane    Gait Pattern Step-through pattern;Decreased stride length    Ambulation Surface Level;Indoor               Balance Exercises - 04/08/20 1213      OTAGO PROGRAM   Knee Extensor Other reps (comment)   2x10 reps   Knee Flexor 20 reps    Hip ABductor 20 reps    Ankle Plantorflexors 20 reps, support   seated   Ankle Dorsiflexors 20 reps, support   seated   Overall OTAGO Comments Cues to improve R knee ext during LAQs, decr. hip flex during hamstring curls, and posture. Rest breaks required after each exercise 2/2 fatigue.             PT Education - 04/08/20 1307    Education Details PT educated pt on SBQC height and  reviewed/progressed HEP as tolerated.    Person(s) Educated Patient    Methods Explanation;Handout;Demonstration;Verbal cues    Comprehension Verbalized understanding            PT Short Term Goals - 04/08/20 1308      PT SHORT TERM GOAL #1   Title Pt will be IND with HEP to improve balance, strength, and endurance. TARGET DATE FOR ALL STGS: 03/04/20    Baseline Patient reports independence with HEP [facility])    Time 4    Period Weeks    Status Achieved      PT SHORT TERM GOAL #2   Title Perform BERG and write STG/LTGs as indicated.    Baseline 42/56    Time 4    Period Weeks    Status Achieved      PT SHORT TERM GOAL #3   Title Pt will amb. 200' over even terrain with SPC at MOD I level to negotiate all areas of home safely.    Baseline 115 even terrain with SBQC Supervision/CGA    Time 4    Period Weeks    Status On-going      PT SHORT TERM GOAL #4   Title Pt will improve gait speed to >/=2.18ft/sec. with LRAD to improve functional mobility and gait efficiency.    Baseline 1.52ft/sec and 1.35ft/sec with RW; 1.90 ft/sec    Time 4    Period Weeks    Status On-going             PT Long Term Goals - 03/30/20 1557      PT LONG TERM GOAL #1   Title Pt will improve TUG time with LRAD to 13.5sec to decr. falls risk. TARGET DATE FOR ALL LTGS: 04/22/20    Baseline 18.18 sec c RW    Time 8    Period Weeks    Status New      PT LONG TERM GOAL #2   Title Pt will improve gait speed to >/=2.12ft/sec with LRAD to safely amb. in the community.    Baseline 1.49ft/sec and 1.34ft/sec c RW    Time 8    Period Weeks    Status New      PT LONG TERM GOAL #3   Title Pt will amb. 500' over even and paved surfaces with LRAD to improve functional mobility.    Time 8    Period Weeks    Status New      PT LONG TERM GOAL #4  Title Perform DGI if applicable and write goal.    Time 8    Period Weeks    Status New      PT LONG TERM GOAL #5   Title Pt will traverse 16 steps with  one handrail and SPC at MOD I level to safely traverse stairs at home.    Time 8    Period Weeks    Status New      PT LONG TERM GOAL #6   Title Patient will improve Berg Balance to >/= 46/56 to demonstrate improved balance and reduced fall risk    Baseline 42/56    Time 8    Period Weeks    Status New                 Plan - 04/08/20 1309    Clinical Impression Statement Pt demonstrated progress in strength, as she was able to incr. reps during strengthening HEP. Pt also demonstrated improved balance and decr. gait deviations. Pt continues to require rest breaks 2/2 fatigue. Pt would continue to benefit from skilled PT to improve safety during functional mobility. PT moved LTGs to 04/23/19 2/2 pt missing appt's due to bronchitis.    Personal Factors and Comorbidities Age;Comorbidity 3+;Finances    Comorbidities ORIF humerus 08/2019 with I&D's for infection, HTN, DM, glaucoma, macular degeneration, diabetic retinopathy, arthritis, depression, HLD, hx of endometrial CA, abscess R RU, virteous detachment (L side)    Examination-Activity Limitations Carry;Stand;Stairs;Squat;Transfers;Locomotion Level    Examination-Participation Restrictions Driving;Meal Prep;Cleaning;Laundry;Community Activity    Stability/Clinical Decision Making Evolving/Moderate complexity    Rehab Potential Good    PT Frequency 1x / week   pt not able to afford twice a week   PT Duration 8 weeks    PT Treatment/Interventions ADLs/Self Care Home Management;Aquatic Therapy;Biofeedback;Canalith Repostioning;Balance training;Therapeutic exercise;Manual techniques;Vestibular;Therapeutic activities;Functional mobility training;Orthotic Fit/Training;Stair training;Gait training;DME Instruction;Patient/family education;Neuromuscular re-education    PT Next Visit Plan Continue to review and progress OTAGO as indicated. Continue balance exercises, gait and stair instruction.    PT Home Exercise Plan OTAGO    Consulted and  Agree with Plan of Care Patient           Patient will benefit from skilled therapeutic intervention in order to improve the following deficits and impairments:  Abnormal gait,Decreased endurance,Impaired sensation,Decreased knowledge of use of DME,Decreased balance,Decreased mobility,Decreased strength,Impaired UE functional use,Increased edema,Decreased range of motion,Impaired flexibility,Postural dysfunction  Visit Diagnosis: Unsteadiness on feet  Muscle weakness (generalized)  Other abnormalities of gait and mobility  Abnormal posture     Problem List Patient Active Problem List   Diagnosis Date Noted  . Nonproliferative diabetic retinopathy of both eyes ([place]) 01/06/2020  . Degenerative retinal drusen of both eyes 01/06/2020  . Posterior vitreous detachment of left eye 01/06/2020  . Early stage nonexudative age-related macular degeneration of both eyes 01/06/2020  . Postoperative infection   . Abscess of right upper arm and forearm 09/15/2019  . Cellulitis of right upper extremity 09/14/2019  . Insulin-requiring or dependent type II diabetes mellitus ([place]) 09/14/2019  . Hyponatremia   . Right supracondylar humerus fracture, closed, initial encounter 08/20/2019  . Abnormal EKG 08/10/2016  . Hypertension 08/10/2016    Ember Gottwald L 04/08/2020, 1:12 PM  [facility] [address] [place], [place], [address] Phone: [phone]   Fax:  [phone]  Name: SHAMEYA WHISLER MRN: [id number] Date of Birth: [birth year]  Geoffry Paradise, PT,DPT 04/08/20 1:12 PM Phone: [phone] Fax: [phone]

## 2020-04-13 ENCOUNTER — Ambulatory Visit: Payer: Medicare HMO | Admitting: Occupational Therapy

## 2020-04-13 ENCOUNTER — Ambulatory Visit: Payer: Medicare HMO

## 2020-04-15 ENCOUNTER — Other Ambulatory Visit: Payer: Self-pay

## 2020-04-15 ENCOUNTER — Ambulatory Visit: Payer: Medicare HMO | Attending: Orthopaedic Surgery

## 2020-04-15 ENCOUNTER — Ambulatory Visit: Payer: Medicare HMO | Admitting: Occupational Therapy

## 2020-04-15 ENCOUNTER — Encounter: Payer: Self-pay | Admitting: Occupational Therapy

## 2020-04-15 DIAGNOSIS — M25621 Stiffness of right elbow, not elsewhere classified: Secondary | ICD-10-CM | POA: Diagnosis not present

## 2020-04-15 DIAGNOSIS — M6281 Muscle weakness (generalized): Secondary | ICD-10-CM | POA: Insufficient documentation

## 2020-04-15 DIAGNOSIS — R2689 Other abnormalities of gait and mobility: Secondary | ICD-10-CM

## 2020-04-15 DIAGNOSIS — R600 Localized edema: Secondary | ICD-10-CM | POA: Diagnosis not present

## 2020-04-15 DIAGNOSIS — R293 Abnormal posture: Secondary | ICD-10-CM

## 2020-04-15 DIAGNOSIS — M25611 Stiffness of right shoulder, not elsewhere classified: Secondary | ICD-10-CM | POA: Diagnosis not present

## 2020-04-15 DIAGNOSIS — R2681 Unsteadiness on feet: Secondary | ICD-10-CM | POA: Insufficient documentation

## 2020-04-15 DIAGNOSIS — R29818 Other symptoms and signs involving the nervous system: Secondary | ICD-10-CM | POA: Diagnosis not present

## 2020-04-15 NOTE — Therapy (Signed)
[facility] [address] [place], [place], [address] Phone: [phone]   Fax:  [phone]  Physical Therapy Treatment  Patient Details  Name: Kayla Griffin MRN: [id number] Date of Birth: 1940-12-14 Referring Provider (PT): Dr. Erlinda Hong   Encounter Date: 04/15/2020   PT End of Session - 04/15/20 1104    Visit Number 8    Number of Visits 9    Date for PT Re-Evaluation 05/05/20    Authorization Type A[employer]na Medicare (PN every 10th visit)    PT Start Time 1101    PT Stop Time 1145    PT Time Calculation (min) 44 min    Equipment Utilized During Treatment Other (comment)   S prn   Activity Tolerance Patient tolerated treatment well    Behavior During Therapy [facility] for tasks assessed/performed           Past Medical History:  Diagnosis Date  . Ambulates with cane   . Arthritis   . Cancer [facility])    Endometrial - hysterectomy  . Depression   . Diabetes mellitus without complication ([place])    Type II  . Dyspnea    currently with arm pain 08/22/19  . Glaucoma   . HLD (hyperlipidemia)   . Hypertension   . Wears dentures     Past Surgical History:  Procedure Laterality Date  . ABDOMINAL HYSTERECTOMY    . CERVICAL FUSION  2007  . EYE SURGERY Bilateral    cataracts  . I & D EXTREMITY Right 09/15/2019   Procedure: IRRIGATION AND DEBRIDEMENT ELBOW;  Surgeon: Leandrew Koyanagi, MD;  Location: [place];  Service: Orthopedics;  Laterality: Right;  . IRRIGATION AND DEBRIDEMENT ELBOW Right 12/11/2019   Procedure: RIGHT ELBOW IRRIGATION AND DEBRIDEMENT;  Surgeon: Leandrew Koyanagi, MD;  Location: [place];  Service: Orthopedics;  Laterality: Right;  . ORIF HUMERUS FRACTURE Right 08/25/2019   Procedure: OPEN REDUCTION INTERNAL FIXATION (ORIF) RIGHT SUPRACONDYLAR HUMERUS FRACTURE, NEUROLYSIS ULNAR NERVE;  Surgeon: Leandrew Koyanagi, MD;  Location: [place];  Service: Orthopedics;  Laterality: Right;  . ORIF PATELLA Right 07/07/2016    Procedure: Partial Patellectomy;  Surgeon: Leandrew Koyanagi, MD;  Location: [place];  Service: Orthopedics;  Laterality: Right;    There were no vitals filed for this visit.   Subjective Assessment - 04/15/20 1104    Subjective Patient reports doing well since last visit. No falls.    Pertinent History ORIF humerus 08/2019 with I&D's for infection, HTN, DM, glaucoma, macular degeneration, diabetic retinopathy, arthritis, depression, HLD, hx of endometrial CA, abscess R RU, virteous detachment (L side)    Patient Stated Goals Be able to use SPC vs. RW, go up stairs    Currently in Pain? No/denies                [facility] Adult PT Treatment/Exercise - 04/15/20 0001      Ambulation/Gait   Ambulation/Gait Yes    Ambulation/Gait Assistance 5: Supervision    Ambulation/Gait Assistance Details throughout therapy gym with activities    Assistive device Rolling walker    Gait Pattern Step-through pattern;Decreased stride length    Ambulation Surface Level;Indoor      Standardized Balance Assessment   Standardized Balance Assessment Berg Balance Test      Berg Balance Test   Sit to Stand Able to stand without using hands and stabilize independently    Standing Unsupported Able to stand safely 2 minutes    Sitting with  Back Unsupported but Feet Supported on Floor or Stool Able to sit safely and securely 2 minutes    Stand to Sit Sits safely with minimal use of hands    Transfers Able to transfer safely, minor use of hands    Standing Unsupported with Eyes Closed Able to stand 10 seconds safely    Standing Ubsupported with Feet Together Able to place feet together independently and stand 1 minute safely    From Standing, Reach Forward with Outstretched Arm Can reach forward >12 cm safely (5")    From Standing Position, Pick up Object from Floor Able to pick up shoe safely and easily    From Standing Position, Turn to Look Behind Over each Shoulder Looks behind one side only/other side shows less  weight shift    Turn 360 Degrees Able to turn 360 degrees safely one side only in 4 seconds or less    Standing Unsupported, Alternately Place Feet on Step/Stool Able to stand independently and complete 8 steps >20 seconds    Standing Unsupported, One Foot in Front Able to take small step independently and hold 30 seconds    Standing on One Leg Tries to lift leg/unable to hold 3 seconds but remains standing independently    Total Score 47      Exercises   Exercises Knee/Hip      Knee/Hip Exercises: Aerobic   Nustep Completed NuStep on Level 3 x 5 mintues with BLE/BUE for improved strengthening and endurance.      Knee/Hip Exercises: Standing   Heel Raises Both;2 sets;10 reps    Heel Raises Limitations at countertop with light UE support    Hip Abduction Stengthening;Both;1 set;10 reps;Knee straight;Limitations    Abduction Limitations verbal cues for technique    Hip Extension Stengthening;Both;1 set;10 reps;Knee straight;Limitations    Extension Limitations patient demo increased range with knee bending, PT educating on completing in smaller range and keeping knee straight with completion for improved technique    Other Standing Knee Exercises Completed Mini Squats at countertop 2 x 5 reps with light UE support.               Balance Exercises - 04/15/20 0001      Balance Exercises: Standing   Standing Eyes Opened Narrow base of support (BOS);Head turns;Solid surface;Limitations    Standing Eyes Opened Limitations completed standing narrow BOS on firm surface at countertop, completed horizontal/vertical head turns x 10 reps each direction.    Other Standing Exercises completed staggered stance working on anterior/posterior shift x 10 reps, then alternating foot position and completing second set x 10 reps. CGA from PT.               PT Short Term Goals - 04/08/20 1308      PT SHORT TERM GOAL #1   Title Pt will be IND with HEP to improve balance, strength, and endurance.  TARGET DATE FOR ALL STGS: 03/04/20    Baseline Patient reports independence with HEP [facility])    Time 4    Period Weeks    Status Achieved      PT SHORT TERM GOAL #2   Title Perform BERG and write STG/LTGs as indicated.    Baseline 42/56    Time 4    Period Weeks    Status Achieved      PT SHORT TERM GOAL #3   Title Pt will amb. 200' over even terrain with SPC at MOD I level to negotiate all areas of  home safely.    Baseline 115 even terrain with SBQC Supervision/CGA    Time 4    Period Weeks    Status On-going      PT SHORT TERM GOAL #4   Title Pt will improve gait speed to >/=2.51ft/sec. with LRAD to improve functional mobility and gait efficiency.    Baseline 1.71ft/sec and 1.105ft/sec with RW; 1.90 ft/sec    Time 4    Period Weeks    Status On-going             PT Long Term Goals - 04/15/20 1121      PT LONG TERM GOAL #1   Title Pt will improve TUG time with LRAD to 13.5sec to decr. falls risk. TARGET DATE FOR ALL LTGS: 04/22/20    Baseline 18.18 sec c RW    Time 8    Period Weeks    Status New      PT LONG TERM GOAL #2   Title Pt will improve gait speed to >/=2.75ft/sec with LRAD to safely amb. in the community.    Baseline 1.37ft/sec and 1.63ft/sec c RW    Time 8    Period Weeks    Status New      PT LONG TERM GOAL #3   Title Pt will amb. 500' over even and paved surfaces with LRAD to improve functional mobility.    Time 8    Period Weeks    Status New      PT LONG TERM GOAL #4   Title Perform DGI if applicable and write goal.    Time 8    Period Weeks    Status New      PT LONG TERM GOAL #5   Title Pt will traverse 16 steps with one handrail and SPC at MOD I level to safely traverse stairs at home.    Time 8    Period Weeks    Status New      PT LONG TERM GOAL #6   Title Patient will improve Berg Balance to >/= 46/56 to demonstrate improved balance and reduced fall risk    Baseline 42/56; 47/56    Time 8    Period Weeks    Status Achieved                  Plan - 04/15/20 1155    Clinical Impression Statement Began assesment of patient's progress toward LTG with completion of Berg Balance. Patient scoring 47/56 today during session demonstrating improved balance and reduced fall risk. Rest of session focused on continued balance and strengthening exercises continue to progress as tolerated. Intermittent rest breaks required due to fatigue. Will continue to progress toward all LTGs.    Personal Factors and Comorbidities Age;Comorbidity 3+;Finances    Comorbidities ORIF humerus 08/2019 with I&D's for infection, HTN, DM, glaucoma, macular degeneration, diabetic retinopathy, arthritis, depression, HLD, hx of endometrial CA, abscess R RU, virteous detachment (L side)    Examination-Activity Limitations Carry;Stand;Stairs;Squat;Transfers;Locomotion Level    Examination-Participation Restrictions Driving;Meal Prep;Cleaning;Laundry;Community Activity    Stability/Clinical Decision Making Evolving/Moderate complexity    Rehab Potential Good    PT Frequency 1x / week   pt not able to afford twice a week   PT Duration 8 weeks    PT Treatment/Interventions ADLs/Self Care Home Management;Aquatic Therapy;Biofeedback;Canalith Repostioning;Balance training;Therapeutic exercise;Manual techniques;Vestibular;Therapeutic activities;Functional mobility training;Orthotic Fit/Training;Stair training;Gait training;DME Instruction;Patient/family education;Neuromuscular re-education    PT Next Visit Plan LTGS [id number], Re-Cert or D/C. Due Continue to review and progress  OTAGO as indicated. Continue balance exercises, gait and stair instruction.    PT Home Exercise Plan OTAGO    Consulted and Agree with Plan of Care Patient           Patient will benefit from skilled therapeutic intervention in order to improve the following deficits and impairments:  Abnormal gait,Decreased endurance,Impaired sensation,Decreased knowledge of use of DME,Decreased  balance,Decreased mobility,Decreased strength,Impaired UE functional use,Increased edema,Decreased range of motion,Impaired flexibility,Postural dysfunction  Visit Diagnosis: Unsteadiness on feet  Muscle weakness (generalized)  Other abnormalities of gait and mobility     Problem List Patient Active Problem List   Diagnosis Date Noted  . Nonproliferative diabetic retinopathy of both eyes ([place]) 01/06/2020  . Degenerative retinal drusen of both eyes 01/06/2020  . Posterior vitreous detachment of left eye 01/06/2020  . Early stage nonexudative age-related macular degeneration of both eyes 01/06/2020  . Postoperative infection   . Abscess of right upper arm and forearm 09/15/2019  . Cellulitis of right upper extremity 09/14/2019  . Insulin-requiring or dependent type II diabetes mellitus ([place]) 09/14/2019  . Hyponatremia   . Right supracondylar humerus fracture, closed, initial encounter 08/20/2019  . Abnormal EKG 08/10/2016  . Hypertension 08/10/2016    Jones Bales, PT, DPT 04/15/2020, 11:57 AM  [facility] [address] [place], [place], [address] Phone: [phone]   Fax:  [phone]  Name: KASI REDDICKS MRN: [id number] Date of Birth: [birth year]

## 2020-04-15 NOTE — Therapy (Signed)
[facility] [address] [place], [place], [address] Phone: [phone]   Fax:  [phone]  Occupational Therapy Treatment  Patient Details  Name: Kayla Griffin MRN: [id number] Date of Birth: 1940-07-24 Referring Provider (OT): Dr. Frankey Shown   Encounter Date: 04/15/2020   OT End of Session - 04/15/20 1023    Visit Number 8    Number of Visits 11    Date for OT Re-Evaluation 04/11/20    Authorization Type [employer]    Authorization - Visit Number 8    Authorization - Number of Visits 18   renewal completed on visit 8   OT Start Time 1020    OT Stop Time 1100    OT Time Calculation (min) 40 min    Activity Tolerance Patient tolerated treatment well    Behavior During Therapy [facility] for tasks assessed/performed           Past Medical History:  Diagnosis Date  . Ambulates with cane   . Arthritis   . Cancer [facility])    Endometrial - hysterectomy  . Depression   . Diabetes mellitus without complication ([place])    Type II  . Dyspnea    currently with arm pain 08/22/19  . Glaucoma   . HLD (hyperlipidemia)   . Hypertension   . Wears dentures     Past Surgical History:  Procedure Laterality Date  . ABDOMINAL HYSTERECTOMY    . CERVICAL FUSION  2007  . EYE SURGERY Bilateral    cataracts  . I & D EXTREMITY Right 09/15/2019   Procedure: IRRIGATION AND DEBRIDEMENT ELBOW;  Surgeon: Leandrew Koyanagi, MD;  Location: [facility];  Service: Orthopedics;  Laterality: Right;  . IRRIGATION AND DEBRIDEMENT ELBOW Right 12/11/2019   Procedure: RIGHT ELBOW IRRIGATION AND DEBRIDEMENT;  Surgeon: Leandrew Koyanagi, MD;  Location: [place];  Service: Orthopedics;  Laterality: Right;  . ORIF HUMERUS FRACTURE Right 08/25/2019   Procedure: OPEN REDUCTION INTERNAL FIXATION (ORIF) RIGHT SUPRACONDYLAR HUMERUS FRACTURE, NEUROLYSIS ULNAR NERVE;  Surgeon: Leandrew Koyanagi, MD;  Location: [place];  Service: Orthopedics;  Laterality: Right;  .  ORIF PATELLA Right 07/07/2016   Procedure: Partial Patellectomy;  Surgeon: Leandrew Koyanagi, MD;  Location: [place] [facility];  Service: Orthopedics;  Laterality: Right;    There were no vitals filed for this visit.   Subjective Assessment - 04/15/20 1022    Subjective  Denies pain    Pertinent History ORIF Rt supracondylar humerus fx 08/19/19, I & D 09/14/19 and 12/18/19.    Limitations Do not use heat due to hx of infection    Patient Stated Goals be able to use arm more, be able to use my cane and not need walker (was using cane in R hand prior), be able to go upstairs again    Currently in Pain? No/denies                Treatment: Closed chain shoulder flexion, elbow flexion extension and chest press with cane, min to mod v.c for proper positioning, followed by unilateral RUE AA/ROM shoulder flexion. Unilateral elbow flexion/ extension with 2 lbs weight x 20 reps then with bilateral UE's and cane with 4 lbs weight. Pt performed unilateral shoulder flexion with 1 lbs weight to grossly 80* for proper positioning. Functional overhead reaching to place and remove graded clothespins with RUE for sustained grip in seated/ standing min v.c for positioning. Therapist checked progress towards long and short term goals  for renewal                  OT Short Term Goals - 04/15/20 1035      OT SHORT TERM GOAL #1   Title Pt will be independent with initial HEP.--check STGs 02/23/20    Time 6    Period Weeks    Status Achieved      OT SHORT TERM GOAL #2   Title Pt will demo at least -55* R elbow ext for functional reach.    Baseline -70*    Time 6    Period Weeks    Status Achieved   04/15/20 -45     OT SHORT TERM GOAL #3   Title Pt will demo at least 135* R elbow flexion for incr ease with using RUE to eat/care for hair.    Baseline 125*    Time 12    Period Weeks    Status Achieved   met 04/15/20-135* pt fixes her own hair     OT SHORT TERM GOAL #4   Title Pt will demo at least 100* R  shoulder flexion for functional reach    Baseline 90*    Time 6    Period Weeks    Status Achieved   100* for shoulder flexion/ scaption     OT SHORT TERM GOAL #5   Title Pt will improve R grip strength by at least 5lbs to assist with lifting/opening containers.    Baseline 25    Time 6    Period Weeks    Status Not Met   26.2 lbs     OT SHORT TERM GOAL #6   Title Pt will be independent with edema management and desensitization techniques.    Time 6    Period Weeks    Status Partially Met   Pt demonstrates understanding of desensitization            OT Long Term Goals - 04/15/20 1042      OT LONG TERM GOAL #1   Title Pt will be independent with updated HEP for strengthening.--check LTGs 04/11/20    Time 4    Period Weeks    Status On-going      OT LONG TERM GOAL #2   Title Pt will demo at least -45* R elbow ext for functional reach revised goal Pt will demo at least -40* R elbow ext for functional reach    Baseline -70*    Time 12    Period Weeks    Status Revised   met -45     OT LONG TERM GOAL #3   Title Pt will improve RUE strength be able to lift gallon of milk at midlevel to place in cart or pour.    Time 12    Period Weeks    Status On-going   pt has to use her other hand to assist with pouring     OT LONG TERM GOAL #4   Title Pt will improve R grip strength by at least 10lbs for lifting. revised goal  Pt will increase grip strength to 30 lbs for increased functional use.    Baseline 26.2 at renewal    Time 12    Period Weeks    Status Revised   26.2     OT LONG TERM GOAL #5   Title Pt will demo at least 110* R shoulder flex for functional reaching and incr ease with hair care. revised goal Pt will demo  at least 105  R shoulder flex/ scaption  for functional reaching and incr ease with hair care.    Baseline 90*    Time 12    Period Weeks    Status Revised   100 *     OT LONG TERM GOAL #6   Title Pt will be able to eat with dominant RUE 100% of the  time.    Baseline 50%    Period Weeks    Status Achieved                 Plan - 04/15/20 1527    Clinical Impression Statement Pt returns to occupational therapy following a break due to illness and scheduling issues. Pt can benefit from continued skilled occupational therapy to address the following deficits: decreased strength, decreased ROM, pain, decreased functional use which impedes perfromance of ADLs/ IADLS.    OT Occupational Profile and History Detailed Assessment- Review of Records and additional review of physical, cognitive, psychosocial history related to current functional performance    Occupational performance deficits (Please refer to evaluation for details): ADL's;IADL's    Body Structure / Function / Physical Skills Balance;Mobility;Skin integrity;Strength;Edema;ADL;UE functional use;Pain;ROM;Sensation;IADL    Rehab Potential Fair    Clinical Decision Making Several treatment options, min-mod task modification necessary    OT Frequency 1x / week    OT Duration 4 weeks    OT Treatment/Interventions Therapeutic exercise;Functional Mobility Training;Self-care/ADL training;Ultrasound;Neuromuscular education;Manual Therapy;Splinting;Therapeutic activities;Cryotherapy;DME and/or AE instruction;Scar mobilization;Electrical Stimulation;Moist Heat;Contrast Bath;Passive range of motion;Patient/family education    Plan A/ROM, P/ROM, UBE, gentle strengthening- do not use heat, hx of infection    Recommended Other Services physical therapy due to hx of falls, and decr functional mobility (unable to go upstairs)           Patient will benefit from skilled therapeutic intervention in order to improve the following deficits and impairments:   Body Structure / Function / Physical Skills: Balance,Mobility,Skin integrity,Strength,Edema,ADL,UE functional use,Pain,ROM,Sensation,IADL       Visit Diagnosis: Muscle weakness (generalized)  Other abnormalities of gait and  mobility  Abnormal posture  Stiffness of right elbow, not elsewhere classified  Other symptoms and signs involving the nervous system  Stiffness of right shoulder, not elsewhere classified  Localized edema    Problem List Patient Active Problem List   Diagnosis Date Noted  . Nonproliferative diabetic retinopathy of both eyes ([place]) 01/06/2020  . Degenerative retinal drusen of both eyes 01/06/2020  . Posterior vitreous detachment of left eye 01/06/2020  . Early stage nonexudative age-related macular degeneration of both eyes 01/06/2020  . Postoperative infection   . Abscess of right upper arm and forearm 09/15/2019  . Cellulitis of right upper extremity 09/14/2019  . Insulin-requiring or dependent type II diabetes mellitus ([place]) 09/14/2019  . Hyponatremia   . Right supracondylar humerus fracture, closed, initial encounter 08/20/2019  . Abnormal EKG 08/10/2016  . Hypertension 08/10/2016    Loxley Cibrian 04/15/2020, 3:36 PM Theone Murdoch, OTR/L [phone] Phone: [phone] 3:37 PM 04/15/20 [facility] [address] [place], [place], [address] Phone: [phone]   Fax:  [phone]  Name: AILIE GAGE MRN: [id number] Date of Birth: 1940-04-14

## 2020-04-20 ENCOUNTER — Ambulatory Visit (INDEPENDENT_AMBULATORY_CARE_PROVIDER_SITE_OTHER): Payer: Medicare HMO

## 2020-04-20 ENCOUNTER — Ambulatory Visit: Payer: Medicare HMO | Admitting: Orthopaedic Surgery

## 2020-04-20 ENCOUNTER — Encounter: Payer: Self-pay | Admitting: Orthopaedic Surgery

## 2020-04-20 ENCOUNTER — Ambulatory Visit: Payer: Medicare HMO

## 2020-04-20 ENCOUNTER — Encounter: Payer: Medicare HMO | Admitting: Occupational Therapy

## 2020-04-20 VITALS — Ht 64.0 in | Wt 182.0 lb

## 2020-04-20 DIAGNOSIS — S42411A Displaced simple supracondylar fracture without intercondylar fracture of right humerus, initial encounter for closed fracture: Secondary | ICD-10-CM | POA: Diagnosis not present

## 2020-04-20 NOTE — Progress Notes (Signed)
Office Visit Note   Patient: Kayla Griffin           Date of Birth: 24-Apr-1940           MRN: [id number] Visit Date: 04/20/2020              Requested by: Aretta Nip, [place],   [id number] PCP: Aretta Nip, MD   Assessment & Plan: Visit Diagnoses:  1. Right supracondylar humerus fracture, closed, initial encounter     Plan: Impression is healed right supracondylar humerus fracture.  No evidence of infection.  At this point we will release her since x-rays demonstrate full fracture consolidation and she has been off of antibiotics for 3 months without any evidence of recurrence of infection.  We will see her back as needed.  Follow-Up Instructions: Return if symptoms worsen or fail to improve.   Orders:  Orders Placed This Encounter  Procedures  . XR Elbow 2 Views Right   No orders of the defined types were placed in this encounter.     Procedures: No procedures performed   Clinical Data: No additional findings.   Subjective: Chief Complaint  Patient presents with  . Right Elbow - Follow-up    Right elbow I&D 12/11/2019    Kayla Griffin is coming in today for recheck of her elbow.  She has been able to improve her elbow extension to 45 degrees with PT once a week.  She has no pain.  She has learned to adapt to her elbow restrictions very well.  No complaints.   Review of Systems   Objective: Vital Signs: Ht 5\' 4"  (1.626 m)   Wt 182 lb (82.6 kg)   LMP  (LMP Unknown)   BMI 31.24 kg/m   Physical Exam  Ortho Exam Right elbow shows fully healed surgical scars.  No evidence of recurrent infection.  Elbow extension has improved compared to prior exam.  No pain with range of motion. Specialty Comments:  No specialty comments available.  Imaging: XR Elbow 2 Views Right  Result Date: 04/20/2020 The fractures and osteotomy have all fully consolidated without any hardware complications.    PMFS History: Patient Active  Problem List   Diagnosis Date Noted  . Nonproliferative diabetic retinopathy of both eyes ([employer]) 01/06/2020  . Degenerative retinal drusen of both eyes 01/06/2020  . Posterior vitreous detachment of left eye 01/06/2020  . Early stage nonexudative age-related macular degeneration of both eyes 01/06/2020  . Postoperative infection   . Abscess of right upper arm and forearm 09/15/2019  . Cellulitis of right upper extremity 09/14/2019  . Insulin-requiring or dependent type II diabetes mellitus ([place]) 09/14/2019  . Hyponatremia   . Right supracondylar humerus fracture, closed, initial encounter 08/20/2019  . Abnormal EKG 08/10/2016  . Hypertension 08/10/2016   Past Medical History:  Diagnosis Date  . Ambulates with cane   . Arthritis   . Cancer [facility]    Endometrial - hysterectomy  . Depression   . Diabetes mellitus without complication ([place])    Type II  . Dyspnea    currently with arm pain 08/22/19  . Glaucoma   . HLD (hyperlipidemia)   . Hypertension   . Wears dentures     Family History  Problem Relation Age of Onset  . CVA Mother   . Heart disease Father 67  . Heart attack Sister   . Heart disease Sister   . Arrhythmia Sister   . CVA Sister  Past Surgical History:  Procedure Laterality Date  . ABDOMINAL HYSTERECTOMY    . CERVICAL FUSION  2007  . EYE SURGERY Bilateral    cataracts  . I & D EXTREMITY Right 09/15/2019   Procedure: IRRIGATION AND DEBRIDEMENT ELBOW;  Surgeon: Leandrew Koyanagi, MD;  Location: [facility];  Service: Orthopedics;  Laterality: Right;  . IRRIGATION AND DEBRIDEMENT ELBOW Right 12/11/2019   Procedure: RIGHT ELBOW IRRIGATION AND DEBRIDEMENT;  Surgeon: Leandrew Koyanagi, MD;  Location: [place];  Service: Orthopedics;  Laterality: Right;  . ORIF HUMERUS FRACTURE Right 08/25/2019   Procedure: OPEN REDUCTION INTERNAL FIXATION (ORIF) RIGHT SUPRACONDYLAR HUMERUS FRACTURE, NEUROLYSIS ULNAR NERVE;  Surgeon: Leandrew Koyanagi, MD;  Location: [facility];  Service:  Orthopedics;  Laterality: Right;  . ORIF PATELLA Right 07/07/2016   Procedure: Partial Patellectomy;  Surgeon: Leandrew Koyanagi, MD;  Location: [facility];  Service: Orthopedics;  Laterality: Right;   Social History   Occupational History  . Not on file  Tobacco Use  . Smoking status: Never Smoker  . Smokeless tobacco: Never Used  Vaping Use  . Vaping Use: Never used  Substance and Sexual Activity  . Alcohol use: No  . Drug use: No  . Sexual activity: Not Currently    Birth control/protection: Surgical    Comment: Hysterectomy

## 2020-04-22 ENCOUNTER — Ambulatory Visit: Payer: Medicare HMO

## 2020-04-22 ENCOUNTER — Ambulatory Visit: Payer: Medicare HMO | Admitting: Occupational Therapy

## 2020-04-27 ENCOUNTER — Ambulatory Visit: Payer: Medicare HMO

## 2020-04-27 ENCOUNTER — Encounter: Payer: Medicare HMO | Admitting: Occupational Therapy

## 2020-04-29 ENCOUNTER — Ambulatory Visit: Payer: Medicare HMO

## 2020-04-29 ENCOUNTER — Ambulatory Visit: Payer: Medicare HMO | Admitting: Occupational Therapy

## 2020-05-04 ENCOUNTER — Encounter: Payer: Medicare HMO | Admitting: Occupational Therapy

## 2020-05-04 ENCOUNTER — Ambulatory Visit: Payer: Medicare HMO

## 2020-05-06 ENCOUNTER — Encounter: Payer: Self-pay | Admitting: Occupational Therapy

## 2020-05-06 ENCOUNTER — Ambulatory Visit: Payer: Medicare HMO | Admitting: Occupational Therapy

## 2020-05-06 ENCOUNTER — Other Ambulatory Visit: Payer: Self-pay

## 2020-05-06 ENCOUNTER — Ambulatory Visit: Payer: Medicare HMO

## 2020-05-06 DIAGNOSIS — R293 Abnormal posture: Secondary | ICD-10-CM | POA: Diagnosis not present

## 2020-05-06 DIAGNOSIS — M6281 Muscle weakness (generalized): Secondary | ICD-10-CM

## 2020-05-06 DIAGNOSIS — R600 Localized edema: Secondary | ICD-10-CM | POA: Diagnosis not present

## 2020-05-06 DIAGNOSIS — M25621 Stiffness of right elbow, not elsewhere classified: Secondary | ICD-10-CM

## 2020-05-06 DIAGNOSIS — R2689 Other abnormalities of gait and mobility: Secondary | ICD-10-CM

## 2020-05-06 DIAGNOSIS — M25611 Stiffness of right shoulder, not elsewhere classified: Secondary | ICD-10-CM

## 2020-05-06 DIAGNOSIS — R29818 Other symptoms and signs involving the nervous system: Secondary | ICD-10-CM | POA: Diagnosis not present

## 2020-05-06 DIAGNOSIS — R2681 Unsteadiness on feet: Secondary | ICD-10-CM

## 2020-05-06 NOTE — Therapy (Signed)
[facility] [address] [place], [place], [address] Phone: [phone]   Fax:  [phone]  Physical Therapy Treatment  Patient Details  Name: Kayla Griffin MRN: [id number] Date of Birth: [birth year] Referring Provider (PT): Dr. Erlinda Hong   Encounter Date: 05/06/2020   PT End of Session - 05/06/20 1611    Visit Number 9    Number of Visits 9    Date for PT Re-Evaluation 05/05/20    Authorization Type [employer]na Medicare (PN every 10th visit)    Progress Note Due on Visit 10    PT Start Time 1531    PT Stop Time 1557   d/c   PT Time Calculation (min) 26 min    Equipment Utilized During Treatment Other (comment)   S during TUG   Activity Tolerance Patient tolerated treatment well    Behavior During Therapy [facility] for tasks assessed/performed           Past Medical History:  Diagnosis Date  . Ambulates with cane   . Arthritis   . Cancer [facility])    Endometrial - hysterectomy  . Depression   . Diabetes mellitus without complication ([place])    Type II  . Dyspnea    currently with arm pain 08/22/19  . Glaucoma   . HLD (hyperlipidemia)   . Hypertension   . Wears dentures     Past Surgical History:  Procedure Laterality Date  . ABDOMINAL HYSTERECTOMY    . CERVICAL FUSION  2007  . EYE SURGERY Bilateral    cataracts  . I & D EXTREMITY Right 09/15/2019   Procedure: IRRIGATION AND DEBRIDEMENT ELBOW;  Surgeon: Leandrew Koyanagi, MD;  Location: [facility];  Service: Orthopedics;  Laterality: Right;  . IRRIGATION AND DEBRIDEMENT ELBOW Right 12/11/2019   Procedure: RIGHT ELBOW IRRIGATION AND DEBRIDEMENT;  Surgeon: Leandrew Koyanagi, MD;  Location: [place];  Service: Orthopedics;  Laterality: Right;  . ORIF HUMERUS FRACTURE Right 08/25/2019   Procedure: OPEN REDUCTION INTERNAL FIXATION (ORIF) RIGHT SUPRACONDYLAR HUMERUS FRACTURE, NEUROLYSIS ULNAR NERVE;  Surgeon: Leandrew Koyanagi, MD;  Location: [place];  Service: Orthopedics;   Laterality: Right;  . ORIF PATELLA Right 07/07/2016   Procedure: Partial Patellectomy;  Surgeon: Leandrew Koyanagi, MD;  Location: [place];  Service: Orthopedics;  Laterality: Right;    There were no vitals filed for this visit.   Subjective Assessment - 05/06/20 1534    Subjective Pt denied falls or changes since last visit. Pt states she feels like she's doing well. Pt reported she goes up stairs to shower once a week without trouble.    Pertinent History ORIF humerus 08/2019 with I&D's for infection, HTN, DM, glaucoma, macular degeneration, diabetic retinopathy, arthritis, depression, HLD, hx of endometrial CA, abscess R RU, virteous detachment (L side)    Patient Stated Goals Be able to use SPC vs. RW, go up stairs    Currently in Pain? No/denies                             [facility] Adult PT Treatment/Exercise - 05/06/20 1608      Ambulation/Gait   Ambulation/Gait Yes    Ambulation/Gait Assistance 6: Modified independent (Device/Increase time)    Ambulation/Gait Assistance Details Pt amb. with and without ADs (SBQC and RW). No evidence of imbalance. Pt did require one seated rest break 2/2 fatigue and SOB.    Ambulation Distance (Feet) 30 Feet  no AD, 100' SBQC, 230'x2 RW   Assistive device Rolling walker;None;Small based quad cane    Gait Pattern Step-through pattern;Decreased stride length    Ambulation Surface Level;Indoor   pt did not wish to amb. 2/2 cold weather   Gait velocity 2.42ft/sec. with RW and 2.19ft/sec. with SBQC    Ramp 6: Modified independent (Device)    Gait Comments Ramp x1 (incline and decline). No LOB.      Standardized Balance Assessment   Standardized Balance Assessment Timed Up and Go Test      Timed Up and Go Test   TUG Normal TUG    Normal TUG (seconds) 18.56   no AD and 18.16 sec. with [facility]                 PT Education - 05/06/20 1610    Education Details PT educated pt on goal progress, outcome measure results and d/c. Pt stated  she was pleased with current level of function and overall progress.    Person(s) Educated Patient    Methods Explanation    Comprehension Verbalized understanding            PT Short Term Goals - 04/08/20 1308      PT SHORT TERM GOAL #1   Title Pt will be IND with HEP to improve balance, strength, and endurance. TARGET DATE FOR ALL STGS: 03/04/20    Baseline Patient reports independence with HEP [facility])    Time 4    Period Weeks    Status Achieved      PT SHORT TERM GOAL #2   Title Perform BERG and write STG/LTGs as indicated.    Baseline 42/56    Time 4    Period Weeks    Status Achieved      PT SHORT TERM GOAL #3   Title Pt will amb. 200' over even terrain with SPC at MOD I level to negotiate all areas of home safely.    Baseline 115 even terrain with SBQC Supervision/CGA    Time 4    Period Weeks    Status On-going      PT SHORT TERM GOAL #4   Title Pt will improve gait speed to >/=2.76ft/sec. with LRAD to improve functional mobility and gait efficiency.    Baseline 1.52ft/sec and 1.84ft/sec with RW; 1.90 ft/sec    Time 4    Period Weeks    Status On-going             PT Long Term Goals - 05/06/20 1535      PT LONG TERM GOAL #1   Title Pt will improve TUG time with LRAD to 13.5sec to decr. falls risk. TARGET DATE FOR ALL LTGS: 04/22/20    Baseline 18.18 sec c RW baseline, 18.56 sec. no AD and 18.16 sec. with SBQC on 05/06/20    Time 8    Period Weeks    Status Not Met      PT LONG TERM GOAL #2   Title Pt will improve gait speed to >/=2.59ft/sec with LRAD to safely amb. in the community.    Baseline 1.89ft/sec and 1.97ft/sec c RW, 2.76ft/sec with RW and 2.55ft/sec. with SBQC on 05/06/20    Time 8    Period Weeks    Status Partially Met      PT LONG TERM GOAL #3   Title Pt will amb. 500' over even and paved surfaces with LRAD to improve functional mobility.    Baseline pt required one  seated rest break 2/2 fatigue during amb.    Time 8    Period Weeks     Status Partially Met      PT LONG TERM GOAL #4   Title Perform DGI if applicable and write goal.    Time 8    Period Weeks    Status Deferred      PT LONG TERM GOAL #5   Title Pt will traverse 16 steps with one handrail and SPC at MOD I level to safely traverse stairs at home.    Baseline pt has done this three times at home.    Time 8    Period Weeks    Status Achieved      PT LONG TERM GOAL #6   Title Patient will improve Berg Balance to >/= 46/56 to demonstrate improved balance and reduced fall risk    Baseline 42/56; 47/56    Time 8    Period Weeks    Status Achieved                 Plan - 05/06/20 1611    Clinical Impression Statement Pt demonstrated progress as she met LTGs 5 and 6, partially met LTG 2 and 3, pt did not meet LTG 1, LTG 4 deferred. Pt is pleased with current level of function. Please see pt d/c summary.    Personal Factors and Comorbidities Age;Comorbidity 3+;Finances    Comorbidities ORIF humerus 08/2019 with I&D's for infection, HTN, DM, glaucoma, macular degeneration, diabetic retinopathy, arthritis, depression, HLD, hx of endometrial CA, abscess R RU, virteous detachment (L side)    Examination-Activity Limitations Carry;Stand;Stairs;Squat;Transfers;Locomotion Level    Examination-Participation Restrictions Driving;Meal Prep;Cleaning;Laundry;Community Activity    Stability/Clinical Decision Making Evolving/Moderate complexity    Rehab Potential Good    PT Frequency 1x / week   pt not able to afford twice a week   PT Duration 8 weeks    PT Treatment/Interventions ADLs/Self Care Home Management;Aquatic Therapy;Biofeedback;Canalith Repostioning;Balance training;Therapeutic exercise;Manual techniques;Vestibular;Therapeutic activities;Functional mobility training;Orthotic Fit/Training;Stair training;Gait training;DME Instruction;Patient/family education;Neuromuscular re-education    PT Next Visit Plan d/c.    PT Home Exercise Plan OTAGO    Consulted and  Agree with Plan of Care Patient           Patient will benefit from skilled therapeutic intervention in order to improve the following deficits and impairments:  Abnormal gait,Decreased endurance,Impaired sensation,Decreased knowledge of use of DME,Decreased balance,Decreased mobility,Decreased strength,Impaired UE functional use,Increased edema,Decreased range of motion,Impaired flexibility,Postural dysfunction  Visit Diagnosis: Other abnormalities of gait and mobility  Muscle weakness (generalized)  Unsteadiness on feet     Problem List Patient Active Problem List   Diagnosis Date Noted  . Nonproliferative diabetic retinopathy of both eyes (HCC) 01/06/2020  . Degenerative retinal drusen of both eyes 01/06/2020  . Posterior vitreous detachment of left eye 01/06/2020  . Early stage nonexudative age-related macular degeneration of both eyes 01/06/2020  . Postoperative infection   . Abscess of right upper arm and forearm 09/15/2019  . Cellulitis of right upper extremity 09/14/2019  . Insulin-requiring or dependent type II diabetes mellitus (HCC) 09/14/2019  . Hyponatremia   . Right supracondylar humerus fracture, closed, initial encounter 08/20/2019  . Abnormal EKG 08/10/2016  . Hypertension 08/10/2016    Ginamarie Banfield L 05/06/2020, 4:16 PM   [facility] [address] [place], [place], [address] Phone: [phone]   Fax:  [phone]  Name: Kayla Griffin MRN: [id number] Date of Birth: [birth year]  PHYSICAL THERAPY DISCHARGE  SUMMARY  Visits from Start of Care: 9  Current functional level related to goals / functional outcomes:  PT Long Term Goals - 05/06/20 1535      PT LONG TERM GOAL #1   Title Pt will improve TUG time with LRAD to 13.5sec to decr. falls risk. TARGET DATE FOR ALL LTGS: 04/22/20    Baseline 18.18 sec c RW baseline, 18.56 sec. no AD and 18.16 sec. with SBQC on 05/06/20    Time 8    Period  Weeks    Status Not Met      PT LONG TERM GOAL #2   Title Pt will improve gait speed to >/=2.50ft/sec with LRAD to safely amb. in the community.    Baseline 1.3ft/sec and 1.19ft/sec c RW, 2.30ft/sec with RW and 2.51ft/sec. with SBQC on 05/06/20    Time 8    Period Weeks    Status Partially Met      PT LONG TERM GOAL #3   Title Pt will amb. 500' over even and paved surfaces with LRAD to improve functional mobility.    Baseline pt required one seated rest break 2/2 fatigue during amb.    Time 8    Period Weeks    Status Partially Met      PT LONG TERM GOAL #4   Title Perform DGI if applicable and write goal.    Time 8    Period Weeks    Status Deferred      PT LONG TERM GOAL #5   Title Pt will traverse 16 steps with one handrail and SPC at MOD I level to safely traverse stairs at home.    Baseline pt has done this three times at home.    Time 8    Period Weeks    Status Achieved      PT LONG TERM GOAL #6   Title Patient will improve Berg Balance to >/= 46/56 to demonstrate improved balance and reduced fall risk    Baseline 42/56; 47/56    Time 8    Period Weeks    Status Achieved             Remaining deficits: Decr. Endurance. Pt is pleased with current level of function.   Education / Equipment: HEP  Plan: Patient agrees to discharge.  Patient goals were partially met. Patient is being discharged due to being pleased with the current functional level.  ?????        Geoffry Paradise, PT,DPT 05/06/20 4:17 PM Phone: [phone] Fax: [phone]

## 2020-05-06 NOTE — Therapy (Signed)
[facility] [address] [place], [place], [address] Phone: [phone]   Fax:  [phone]  Occupational Therapy Treatment & Discharge  Patient Details  Name: Kayla Griffin MRN: [id number] Date of Birth: [birth year] Referring Provider (OT): Dr. Frankey Shown   Encounter Date: 05/06/2020   OT End of Session - 05/06/20 1454    Visit Number 9    Number of Visits 11    Date for OT Re-Evaluation 04/11/20    Authorization Type A[employer]na Medicare    Authorization - Visit Number 9    Authorization - Number of Visits 18   renewal completed on visit 8   OT Start Time 1440    OT Stop Time 1506   d/c session   OT Time Calculation (min) 26 min    Activity Tolerance Patient tolerated treatment well    Behavior During Therapy [facility] for tasks assessed/performed           Past Medical History:  Diagnosis Date  . Ambulates with cane   . Arthritis   . Cancer [facility])    Endometrial - hysterectomy  . Depression   . Diabetes mellitus without complication ([place])    Type II  . Dyspnea    currently with arm pain 08/22/19  . Glaucoma   . HLD (hyperlipidemia)   . Hypertension   . Wears dentures     Past Surgical History:  Procedure Laterality Date  . ABDOMINAL HYSTERECTOMY    . CERVICAL FUSION  2007  . EYE SURGERY Bilateral    cataracts  . I & D EXTREMITY Right 09/15/2019   Procedure: IRRIGATION AND DEBRIDEMENT ELBOW;  Surgeon: Leandrew Koyanagi, MD;  Location: [facility];  Service: Orthopedics;  Laterality: Right;  . IRRIGATION AND DEBRIDEMENT ELBOW Right 12/11/2019   Procedure: RIGHT ELBOW IRRIGATION AND DEBRIDEMENT;  Surgeon: Leandrew Koyanagi, MD;  Location: [place];  Service: Orthopedics;  Laterality: Right;  . ORIF HUMERUS FRACTURE Right 08/25/2019   Procedure: OPEN REDUCTION INTERNAL FIXATION (ORIF) RIGHT SUPRACONDYLAR HUMERUS FRACTURE, NEUROLYSIS ULNAR NERVE;  Surgeon: Leandrew Koyanagi, MD;  Location: [place];  Service:  Orthopedics;  Laterality: Right;  . ORIF PATELLA Right 07/07/2016   Procedure: Partial Patellectomy;  Surgeon: Leandrew Koyanagi, MD;  Location: [facility];  Service: Orthopedics;  Laterality: Right;    There were no vitals filed for this visit.   Subjective Assessment - 05/06/20 1443    Subjective  Pt denies any pain.    Pertinent History ORIF Rt supracondylar humerus fx 08/19/19, I & D 09/14/19 and 12/18/19.    Limitations Do not use heat due to hx of infection    Patient Stated Goals be able to use arm more, be able to use my cane and not need walker (was using cane in R hand prior), be able to go upstairs again    Currently in Pain? No/denies                OCCUPATIONAL THERAPY DISCHARGE SUMMARY  Visits from Start of Care: 9  Current functional level related to goals / functional outcomes: Pt currently completing all ADLs and IADLs with greater independence. Pt has met 4/6 STGs and 4/6LTGs and is ready for discharge.     Remaining deficits: Elbow extension ROM and grip strength   Education / Equipment: HEPs  Plan: Patient agrees to discharge.  Patient goals were partially met. Patient is being discharged due to meeting the stated rehab goals.  ?????  OT Treatments/Exercises (OP) - 05/06/20 1458      Exercises   Exercises Hand      Hand Exercises   Other Hand Exercises Level 1 hand gripper with RUE and picking up 1 inch blocks with sustained grip.      Functional Reaching Activities   High Level reaching with RUE to place resistance clothespins 1-8# on antenna and removing to bring down to pegs. Pt did 50% in sitting and 50% in standing and did not have any difficulty - one drop.                    OT Short Term Goals - 04/15/20 1035      OT SHORT TERM GOAL #1   Title Pt will be independent with initial HEP.--check STGs 02/23/20    Time 6    Period Weeks    Status Achieved      OT SHORT TERM GOAL #2   Title Pt will demo at least -55* R  elbow ext for functional reach.    Baseline -70*    Time 6    Period Weeks    Status Achieved   04/15/20 -45     OT SHORT TERM GOAL #3   Title Pt will demo at least 135* R elbow flexion for incr ease with using RUE to eat/care for hair.    Baseline 125*    Time 12    Period Weeks    Status Achieved   met 04/15/20-135* pt fixes her own hair     OT SHORT TERM GOAL #4   Title Pt will demo at least 100* R shoulder flexion for functional reach    Baseline 90*    Time 6    Period Weeks    Status Achieved   100* for shoulder flexion/ scaption     OT SHORT TERM GOAL #5   Title Pt will improve R grip strength by at least 5lbs to assist with lifting/opening containers.    Baseline 25    Time 6    Period Weeks    Status Not Met   26.2 lbs     OT SHORT TERM GOAL #6   Title Pt will be independent with edema management and desensitization techniques.    Time 6    Period Weeks    Status Partially Met   Pt demonstrates understanding of desensitization            OT Long Term Goals - 05/06/20 1445      OT LONG TERM GOAL #1   Title Pt will be independent with updated HEP for strengthening.--check LTGs 04/11/20    Time 4    Period Weeks    Status Achieved      OT LONG TERM GOAL #2   Title Pt will demo at least -45* R elbow ext for functional reach revised goal Pt will demo at least -40* R elbow ext for functional reach    Baseline -70*    Time 12    Period Weeks    Status Not Met   -45     OT LONG TERM GOAL #3   Title Pt will improve RUE strength be able to lift gallon of milk at midlevel to place in cart or pour.    Time 12    Period Weeks    Status Achieved   pt has to use her other hand to assist with pouring     OT LONG TERM GOAL #4  Title Pt will improve R grip strength by at least 10lbs for lifting. revised goal  Pt will increase grip strength to 30 lbs for increased functional use.    Baseline 26.2 at renewal    Time 12    Period Weeks    Status Not Met   28.6     OT  LONG TERM GOAL #5   Title Pt will demo at least 110* R shoulder flex for functional reaching and incr ease with hair care. revised goal Pt will demo at least 105  R shoulder flex/ scaption  for functional reaching and incr ease with hair care.    Baseline 90*    Time 12    Period Weeks    Status Achieved   120*     OT LONG TERM GOAL #6   Title Pt will be able to eat with dominant RUE 100% of the time.    Baseline 50%    Period Weeks    Status Achieved                 Plan - 05/06/20 1450    Clinical Impression Statement Pt has met 4/6 STGs and 4/6 LTGs. Pt has made improvements with grip strength and overall functional use of RUE. Pt is ready for discharge at this time from occupationl therapy due to progress of goals. Pt continues to have deficitis with range of motion with RUE elbow extension and decreased grip strength, however improved.    OT Occupational Profile and History Detailed Assessment- Review of Records and additional review of physical, cognitive, psychosocial history related to current functional performance    Occupational performance deficits (Please refer to evaluation for details): ADL's;IADL's    Body Structure / Function / Physical Skills Balance;Mobility;Skin integrity;Strength;Edema;ADL;UE functional use;Pain;ROM;Sensation;IADL    Rehab Potential Fair    Clinical Decision Making Several treatment options, min-mod task modification necessary    OT Frequency 1x / week    OT Duration 4 weeks    OT Treatment/Interventions Therapeutic exercise;Functional Mobility Training;Self-care/ADL training;Ultrasound;Neuromuscular education;Manual Therapy;Splinting;Therapeutic activities;Cryotherapy;DME and/or AE instruction;Scar mobilization;Electrical Stimulation;Moist Heat;Contrast Bath;Passive range of motion;Patient/family education    Plan d/c OT    Recommended Other Services physical therapy due to hx of falls, and decr functional mobility (unable to go upstairs)            Patient will benefit from skilled therapeutic intervention in order to improve the following deficits and impairments:   Body Structure / Function / Physical Skills: Balance,Mobility,Skin integrity,Strength,Edema,ADL,UE functional use,Pain,ROM,Sensation,IADL       Visit Diagnosis: Muscle weakness (generalized)  Stiffness of right elbow, not elsewhere classified  Stiffness of right shoulder, not elsewhere classified    Problem List Patient Active Problem List   Diagnosis Date Noted  . Nonproliferative diabetic retinopathy of both eyes ([place]) 01/06/2020  . Degenerative retinal drusen of both eyes 01/06/2020  . Posterior vitreous detachment of left eye 01/06/2020  . Early stage nonexudative age-related macular degeneration of both eyes 01/06/2020  . Postoperative infection   . Abscess of right upper arm and forearm 09/15/2019  . Cellulitis of right upper extremity 09/14/2019  . Insulin-requiring or dependent type II diabetes mellitus ([place]) 09/14/2019  . Hyponatremia   . Right supracondylar humerus fracture, closed, initial encounter 08/20/2019  . Abnormal EKG 08/10/2016  . Hypertension 08/10/2016    Zachery Conch MOT, OTR/L  05/06/2020, 3:09 PM  [facility] [address] [place], [place], [address] Phone: [phone]  Fax:  [phone]  Name: NASHIKA COKER MRN: [id number] Date of Birth: [birth year]

## 2020-06-09 ENCOUNTER — Encounter (INDEPENDENT_AMBULATORY_CARE_PROVIDER_SITE_OTHER): Payer: Self-pay

## 2020-06-22 DIAGNOSIS — N952 Postmenopausal atrophic vaginitis: Secondary | ICD-10-CM | POA: Diagnosis not present

## 2020-06-22 DIAGNOSIS — Z4689 Encounter for fitting and adjustment of other specified devices: Secondary | ICD-10-CM | POA: Diagnosis not present

## 2020-06-22 DIAGNOSIS — N8111 Cystocele, midline: Secondary | ICD-10-CM | POA: Diagnosis not present

## 2020-07-05 DIAGNOSIS — E11319 Type 2 diabetes mellitus with unspecified diabetic retinopathy without macular edema: Secondary | ICD-10-CM | POA: Diagnosis not present

## 2020-07-05 DIAGNOSIS — Z5181 Encounter for therapeutic drug level monitoring: Secondary | ICD-10-CM | POA: Diagnosis not present

## 2020-07-05 DIAGNOSIS — I4891 Unspecified atrial fibrillation: Secondary | ICD-10-CM | POA: Diagnosis not present

## 2020-07-05 DIAGNOSIS — Z794 Long term (current) use of insulin: Secondary | ICD-10-CM | POA: Diagnosis not present

## 2020-07-05 DIAGNOSIS — E7800 Pure hypercholesterolemia, unspecified: Secondary | ICD-10-CM | POA: Diagnosis not present

## 2020-07-05 DIAGNOSIS — M810 Age-related osteoporosis without current pathological fracture: Secondary | ICD-10-CM | POA: Diagnosis not present

## 2020-07-05 DIAGNOSIS — I10 Essential (primary) hypertension: Secondary | ICD-10-CM | POA: Diagnosis not present

## 2020-07-05 DIAGNOSIS — I499 Cardiac arrhythmia, unspecified: Secondary | ICD-10-CM | POA: Diagnosis not present

## 2020-07-06 DIAGNOSIS — E7800 Pure hypercholesterolemia, unspecified: Secondary | ICD-10-CM | POA: Diagnosis not present

## 2020-07-06 DIAGNOSIS — Z5181 Encounter for therapeutic drug level monitoring: Secondary | ICD-10-CM | POA: Diagnosis not present

## 2020-07-06 DIAGNOSIS — I499 Cardiac arrhythmia, unspecified: Secondary | ICD-10-CM | POA: Diagnosis not present

## 2020-07-06 DIAGNOSIS — E11319 Type 2 diabetes mellitus with unspecified diabetic retinopathy without macular edema: Secondary | ICD-10-CM | POA: Diagnosis not present

## 2020-07-06 DIAGNOSIS — M810 Age-related osteoporosis without current pathological fracture: Secondary | ICD-10-CM | POA: Diagnosis not present

## 2020-07-06 DIAGNOSIS — I4891 Unspecified atrial fibrillation: Secondary | ICD-10-CM | POA: Diagnosis not present

## 2020-07-06 DIAGNOSIS — I10 Essential (primary) hypertension: Secondary | ICD-10-CM | POA: Diagnosis not present

## 2020-07-06 NOTE — Progress Notes (Signed)
Chief Complaint  Patient presents with  . New Patient (Initial Visit)    Atrial fibrillation/flutter   History of Present Illness: 80 yo female with history of arthritis, depression, DM, hyperlipidemia and HTN who is here today re-establish cardiac care. She is referred back to our office by Dr. Radene Ou for evaluation of new onset atrial fibrillation.  She has been followed in our office by Dr. Marlou Porch, last visit in May 2018. Echo in April 2018 with LVEF=55-60%, mild LVH. No significant valve disease. Her EKG has shown inferior Q waves. No prior cardiac cath. She was seen by Dr. Radene Ou earlier this week for a routine office visit and was found to have atrial fibrillation. She was started on Eliquis 2.5 mg po BID.   She tells me today that she has been feeling well.  No awareness of irregularity of her heart rhythm. The patient denies any chest pain, dyspnea, palpitations, lower extremity edema, orthopnea, PND, dizziness, near syncope or syncope.   Primary Care Physician: Aretta Nip, MD   Past Medical History:  Diagnosis Date  . Ambulates with cane   . Arthritis   . Cancer [facility])    Endometrial - hysterectomy  . Depression   . Diabetes mellitus without complication ([place])    Type II  . Dyspnea    currently with arm pain 08/22/19  . Glaucoma   . HLD (hyperlipidemia)   . Hypertension   . Wears dentures     Past Surgical History:  Procedure Laterality Date  . ABDOMINAL HYSTERECTOMY    . CERVICAL FUSION  2007  . EYE SURGERY Bilateral    cataracts  . I & D EXTREMITY Right 09/15/2019   Procedure: IRRIGATION AND DEBRIDEMENT ELBOW;  Surgeon: Leandrew Koyanagi, MD;  Location: [place];  Service: Orthopedics;  Laterality: Right;  . IRRIGATION AND DEBRIDEMENT ELBOW Right 12/11/2019   Procedure: RIGHT ELBOW IRRIGATION AND DEBRIDEMENT;  Surgeon: Leandrew Koyanagi, MD;  Location: [place];  Service: Orthopedics;  Laterality: Right;  . ORIF HUMERUS FRACTURE Right 08/25/2019    Procedure: OPEN REDUCTION INTERNAL FIXATION (ORIF) RIGHT SUPRACONDYLAR HUMERUS FRACTURE, NEUROLYSIS ULNAR NERVE;  Surgeon: Leandrew Koyanagi, MD;  Location: [place];  Service: Orthopedics;  Laterality: Right;  . ORIF PATELLA Right 07/07/2016   Procedure: Partial Patellectomy;  Surgeon: Leandrew Koyanagi, MD;  Location: [place];  Service: Orthopedics;  Laterality: Right;    Current Outpatient Medications  Medication Sig Dispense Refill  . alendronate (FOSAMAX) 70 MG tablet Take 70 mg by mouth every Sunday. Take with a full glass of water on an empty stomach.    Marland Kitchen apixaban (ELIQUIS) 5 MG TABS tablet Take 1 tablet (5 mg total) by mouth 2 (two) times daily. 60 tablet 5  . atorvastatin (LIPITOR) 40 MG tablet Take 40 mg by mouth daily.     . Calcium Carbonate-Vit D-Min (CALCIUM 1200 PO) Take 1,200 mg by mouth daily.    . collagenase (SANTYL) ointment Apply 1 application topically daily. 15 g 0  . dorzolamide (TRUSOPT) 2 % ophthalmic solution Place 1 drop into both eyes 2 (two) times daily.     . hydrochlorothiazide (HYDRODIURIL) 25 MG tablet Take 25 mg by mouth daily.     . insulin NPH-regular Human (NOVOLIN 70/30) (70-30) 100 UNIT/ML injection Inject into the skin 2 (two) times daily with a meal. TAKE 15U IN AM AND 15U IN PM    . latanoprost (XALATAN) 0.005 % ophthalmic solution Place 1 drop into both  eyes at bedtime.     . metFORMIN (GLUCOPHAGE) 500 MG tablet Take 500 mg by mouth 2 (two) times daily with a meal.    . metoprolol (LOPRESSOR) 100 MG tablet Take 100 mg by mouth 2 (two) times daily.    . ramipril (ALTACE) 10 MG capsule Take 20 mg by mouth daily.      No current facility-administered medications for this visit.    Allergies  Allergen Reactions  . Actos [Pioglitazone] Swelling    SWELLING REACTION UNSPECIFIED   . Cephalexin Nausea And Vomiting    Social History   Socioeconomic History  . Marital status: Single    Spouse name: Not on file  . Number of children: 0  . Years of education: Not  on file  . Highest education level: Not on file  Occupational History  . Occupation: Retired from Therapist, art  Tobacco Use  . Smoking status: Never Smoker  . Smokeless tobacco: Never Used  Vaping Use  . Vaping Use: Never used  Substance and Sexual Activity  . Alcohol use: No  . Drug use: No  . Sexual activity: Not Currently    Birth control/protection: Surgical    Comment: Hysterectomy  Other Topics Concern  . Not on file  Social History Narrative  . Not on file   Social Determinants of Health   Financial Resource Strain: Not on file  Food Insecurity: Not on file  Transportation Needs: Not on file  Physical Activity: Not on file  Stress: Not on file  Social Connections: Not on file  Intimate Partner Violence: Not on file    Family History  Problem Relation Age of Onset  . CVA Mother   . Heart disease Father 8  . Heart attack Sister   . Heart disease Sister   . Arrhythmia Sister   . CVA Sister     Review of Systems:  As stated in the HPI and otherwise negative.   BP (!) 158/90   Pulse 81   Ht 5\' 4"  (1.626 m)   Wt 168 lb (76.2 kg)   LMP  (LMP Unknown)   SpO2 99%   BMI 28.84 kg/m   Physical Examination: General: Well developed, well nourished, NAD  HEENT: OP clear, mucus membranes moist  SKIN: warm, dry. No rashes. Neuro: No focal deficits  Musculoskeletal: Muscle strength 5/5 all ext  Psychiatric: Mood and affect normal  Neck: No JVD, no carotid bruits, no thyromegaly, no lymphadenopathy.  Lungs:Clear bilaterally, no wheezes, rhonci, crackles Cardiovascular: Irreg irreg. No murmurs, gallops or rubs. Abdomen:Soft. Bowel sounds present. Non-tender.  Extremities: No lower extremity edema. Pulses are 2 + in the bilateral DP/PT.  EKG:  EKG is ordered today. The ekg ordered today demonstrates Atrial fibrillation, rate 81 bpm.   Echo April 2018:  - Left ventricle: The cavity size was normal. There was mild  concentric hypertrophy. Systolic  function was normal. The  estimated ejection fraction was in the range of 55% to 60%. Wall  motion was normal; there were no regional wall motion  abnormalities. Doppler parameters are consistent with abnormal  left ventricular relaxation (grade 1 diastolic dysfunction).  Doppler parameters are consistent with indeterminate ventricular  filling pressure.  - Aortic valve: Transvalvular velocity was within the normal range.  There was no stenosis. There was no regurgitation.  - Mitral valve: Transvalvular velocity was within the normal range.  There was no evidence for stenosis. There was trivial  regurgitation.  - Left atrium: The atrium was severely  dilated.  - Right ventricle: The cavity size was normal. Wall thickness was  normal. Systolic function was normal.  - Tricuspid valve: There was no regurgitation.   Recent Labs: 09/16/2019: ALT QUANTITY NOT SUFFICIENT, UNABLE TO PERFORM TEST 12/10/2019: BUN 7; Creatinine, Ser 0.75; Hemoglobin 12.1; Platelets 336; Potassium 4.4; Sodium 129   Lipid Panel No results found for: CHOL, TRIG, HDL, CHOLHDL, VLDL, LDLCALC, LDLDIRECT   Wt Readings from Last 3 Encounters:  07/07/20 168 lb (76.2 kg)  04/20/20 182 lb (82.6 kg)  12/11/19 182 lb 15.7 oz (83 kg)      Assessment and Plan:   1. Atrial fibrillation, persistent: Rate controlled. She is not aware of the arrhythmia. CHADS VASC score 5. (Age over 25, female, HTN, DM: Stroke risk was 7.2% per year in >90,000 patients in the [place] Atrial Fibrillation Cohort Study and 10.0% risk of stroke/TIA/systemic embolism. -Will increase Eliquis to 5 mg po BID. Will follow up on her renal function from labs in primary care this week. We have asked them to fax over the labs today. Renal function normal 6 months ago.  -Continue metoprolol -Stop ASA -Echo to assess LV function and exclude structural heart disease  2. HTN: BP controlled. No changes  Current medicines are reviewed at  length with the patient today.  The patient does not have concerns regarding medicines.  The following changes have been made:  no change  Labs/ tests ordered today include:   Orders Placed This Encounter  Procedures  . EKG 12-Lead  . ECHOCARDIOGRAM COMPLETE     Disposition:   F/U with Dr. Marlou Porch in 6-8 weeks.    Signed, Lauree Chandler, MD 07/07/2020 10:30 AM    [facility] [address], [place], [place]  [address] Phone: [phone]; Fax: [phone]

## 2020-07-07 ENCOUNTER — Encounter: Payer: Self-pay | Admitting: Cardiovascular Disease

## 2020-07-07 ENCOUNTER — Other Ambulatory Visit: Payer: Self-pay

## 2020-07-07 ENCOUNTER — Ambulatory Visit: Payer: Medicare HMO | Admitting: Cardiovascular Disease

## 2020-07-07 VITALS — BP 158/90 | HR 81 | Ht 64.0 in | Wt 168.0 lb

## 2020-07-07 DIAGNOSIS — I4819 Other persistent atrial fibrillation: Secondary | ICD-10-CM

## 2020-07-07 MED ORDER — APIXABAN 5 MG PO TABS: 5.0000 mg | ORAL_TABLET | Freq: Two times a day (BID) | ORAL | 5 refills | Status: DC

## 2020-07-07 NOTE — Patient Instructions (Signed)
Medication Instructions:  Your physician has recommended you make the following change in your medication:  1.) stop aspirin  2.) change Eliquis (apixaban) to 5 mg twice daily  *If you need a refill on your cardiac medications before your next appointment, please call your pharmacy*   Lab Work: none   Testing/Procedures: Your physician has requested that you have an echocardiogram. Echocardiography is a painless test that uses sound waves to create images of your heart. It provides your doctor with information about the size and shape of your heart and how well your heart's chambers and valves are working. This procedure takes approximately one hour. There are no restrictions for this procedure.   Follow-Up: At [facility], you and your health needs are our priority.  As part of our continuing mission to provide you with exceptional heart care, we have created designated Provider Care Teams.  These Care Teams include your primary Cardiologist (physician) and Advanced Practice Providers (APPs -  Physician Assistants and Nurse Practitioners) who all work together to provide you with the care you need, when you need it.  We recommend signing up for the patient portal called "MyChart".  Sign up information is provided on this After Visit Summary.  MyChart is used to connect with patients for Virtual Visits (Telemedicine).  Patients are able to view lab/test results, encounter notes, upcoming appointments, etc.  Non-urgent messages can be sent to your provider as well.   To learn more about what you can do with MyChart, go to [email or web address].    Your next appointment:   6-8 week(s)  The format for your next appointment:   In Person  Provider:   Candee Furbish, MD   Other Instructions

## 2020-07-19 DIAGNOSIS — I4891 Unspecified atrial fibrillation: Secondary | ICD-10-CM | POA: Diagnosis not present

## 2020-07-19 DIAGNOSIS — R946 Abnormal results of thyroid function studies: Secondary | ICD-10-CM | POA: Diagnosis not present

## 2020-07-28 DIAGNOSIS — R634 Abnormal weight loss: Secondary | ICD-10-CM | POA: Diagnosis not present

## 2020-07-28 DIAGNOSIS — M810 Age-related osteoporosis without current pathological fracture: Secondary | ICD-10-CM | POA: Diagnosis not present

## 2020-07-28 DIAGNOSIS — I4891 Unspecified atrial fibrillation: Secondary | ICD-10-CM | POA: Diagnosis not present

## 2020-07-28 DIAGNOSIS — R5383 Other fatigue: Secondary | ICD-10-CM | POA: Diagnosis not present

## 2020-07-28 DIAGNOSIS — E0590 Thyrotoxicosis, unspecified without thyrotoxic crisis or storm: Secondary | ICD-10-CM | POA: Diagnosis not present

## 2020-07-28 DIAGNOSIS — I10 Essential (primary) hypertension: Secondary | ICD-10-CM | POA: Diagnosis not present

## 2020-07-28 DIAGNOSIS — Z8349 Family history of other endocrine, nutritional and metabolic diseases: Secondary | ICD-10-CM | POA: Diagnosis not present

## 2020-08-04 DIAGNOSIS — M810 Age-related osteoporosis without current pathological fracture: Secondary | ICD-10-CM | POA: Diagnosis not present

## 2020-08-04 DIAGNOSIS — E0590 Thyrotoxicosis, unspecified without thyrotoxic crisis or storm: Secondary | ICD-10-CM | POA: Diagnosis not present

## 2020-08-04 DIAGNOSIS — I10 Essential (primary) hypertension: Secondary | ICD-10-CM | POA: Diagnosis not present

## 2020-08-04 DIAGNOSIS — E0500 Thyrotoxicosis with diffuse goiter without thyrotoxic crisis or storm: Secondary | ICD-10-CM | POA: Diagnosis not present

## 2020-08-04 DIAGNOSIS — I4891 Unspecified atrial fibrillation: Secondary | ICD-10-CM | POA: Diagnosis not present

## 2020-08-10 ENCOUNTER — Ambulatory Visit (HOSPITAL_COMMUNITY): Payer: Medicare HMO | Attending: Cardiovascular Disease

## 2020-08-10 ENCOUNTER — Other Ambulatory Visit: Payer: Self-pay

## 2020-08-10 DIAGNOSIS — I4819 Other persistent atrial fibrillation: Secondary | ICD-10-CM

## 2020-08-10 LAB — ECHOCARDIOGRAM COMPLETE: S' Lateral: 3.6 cm

## 2020-08-17 DIAGNOSIS — H401232 Low-tension glaucoma, bilateral, moderate stage: Secondary | ICD-10-CM | POA: Diagnosis not present

## 2020-08-17 DIAGNOSIS — H524 Presbyopia: Secondary | ICD-10-CM | POA: Diagnosis not present

## 2020-08-24 ENCOUNTER — Ambulatory Visit: Payer: Medicare HMO | Admitting: Cardiology

## 2020-08-24 ENCOUNTER — Encounter: Payer: Self-pay | Admitting: Cardiology

## 2020-08-24 ENCOUNTER — Other Ambulatory Visit: Payer: Self-pay

## 2020-08-24 VITALS — BP 140/70 | HR 70 | Ht 64.0 in | Wt 170.0 lb

## 2020-08-24 DIAGNOSIS — I10 Essential (primary) hypertension: Secondary | ICD-10-CM | POA: Diagnosis not present

## 2020-08-24 DIAGNOSIS — I4819 Other persistent atrial fibrillation: Secondary | ICD-10-CM

## 2020-08-24 NOTE — Progress Notes (Signed)
Cardiology Office Note:    Date:  08/24/2020   ID:  Kayla Griffin, DOB [birth year], MRN [id number]  PCP:  Aretta Nip, MD   [facility] Providers Cardiologist:  Candee Furbish, MD     Referring MD: Aretta Nip, MD     History of Present Illness:    Kayla Griffin is a 80 y.o. female here for the follow-up of atrial fibrillation/flutter previously seen by Dr. Angelena Form on 07/07/2020, previously by me in May 2018.  Dr. Radene Ou referred her back over here at that time.  She was started on Eliquis 5 mg once a day.  Denies any fevers chills nausea vomiting syncope bleeding.  See below for details.  Past Medical History:  Diagnosis Date  . Ambulates with cane   . Arthritis   . Cancer [facility])    Endometrial - hysterectomy  . Depression   . Diabetes mellitus without complication ([place])    Type II  . Dyspnea    currently with arm pain 08/22/19  . Glaucoma   . HLD (hyperlipidemia)   . Hypertension   . Wears dentures     Past Surgical History:  Procedure Laterality Date  . ABDOMINAL HYSTERECTOMY    . CERVICAL FUSION  2007  . EYE SURGERY Bilateral    cataracts  . I & D EXTREMITY Right 09/15/2019   Procedure: IRRIGATION AND DEBRIDEMENT ELBOW;  Surgeon: Leandrew Koyanagi, MD;  Location: [place];  Service: Orthopedics;  Laterality: Right;  . IRRIGATION AND DEBRIDEMENT ELBOW Right 12/11/2019   Procedure: RIGHT ELBOW IRRIGATION AND DEBRIDEMENT;  Surgeon: Leandrew Koyanagi, MD;  Location: [place];  Service: Orthopedics;  Laterality: Right;  . ORIF HUMERUS FRACTURE Right 08/25/2019   Procedure: OPEN REDUCTION INTERNAL FIXATION (ORIF) RIGHT SUPRACONDYLAR HUMERUS FRACTURE, NEUROLYSIS ULNAR NERVE;  Surgeon: Leandrew Koyanagi, MD;  Location: [place];  Service: Orthopedics;  Laterality: Right;  . ORIF PATELLA Right 07/07/2016   Procedure: Partial Patellectomy;  Surgeon: Leandrew Koyanagi, MD;  Location: [place];  Service: Orthopedics;  Laterality: Right;    Current  Medications: Current Meds  Medication Sig  . alendronate (FOSAMAX) 70 MG tablet Take 70 mg by mouth every Sunday. Take with a full glass of water on an empty stomach.  Marland Kitchen apixaban (ELIQUIS) 5 MG TABS tablet Take 1 tablet (5 mg total) by mouth 2 (two) times daily.  Marland Kitchen atorvastatin (LIPITOR) 40 MG tablet Take 40 mg by mouth daily.   . Calcium Carbonate-Vit D-Min (CALCIUM 1200 PO) Take 1,200 mg by mouth daily.  . collagenase (SANTYL) ointment Apply 1 application topically daily.  . dorzolamide (TRUSOPT) 2 % ophthalmic solution Place 1 drop into both eyes 2 (two) times daily.   . hydrochlorothiazide (HYDRODIURIL) 25 MG tablet Take 25 mg by mouth daily.   . insulin NPH-regular Human (NOVOLIN 70/30) (70-30) 100 UNIT/ML injection Inject into the skin 2 (two) times daily with a meal. TAKE 15U IN AM AND 15U IN PM  . latanoprost (XALATAN) 0.005 % ophthalmic solution Place 1 drop into both eyes at bedtime.   . metFORMIN (GLUCOPHAGE) 500 MG tablet Take 500 mg by mouth 2 (two) times daily with a meal.  . methimazole (TAPAZOLE) 10 MG tablet Take 10 mg by mouth daily.  . metoprolol (LOPRESSOR) 100 MG tablet Take 100 mg by mouth 3 (three) times daily.  . ramipril (ALTACE) 10 MG capsule Take 20 mg by mouth daily.      Allergies:   Actos [pioglitazone]  and Cephalexin   Social History   Socioeconomic History  . Marital status: Single    Spouse name: Not on file  . Number of children: 0  . Years of education: Not on file  . Highest education level: Not on file  Occupational History  . Occupation: Retired from Therapist, art  Tobacco Use  . Smoking status: Never Smoker  . Smokeless tobacco: Never Used  Vaping Use  . Vaping Use: Never used  Substance and Sexual Activity  . Alcohol use: No  . Drug use: No  . Sexual activity: Not Currently    Birth control/protection: Surgical    Comment: Hysterectomy  Other Topics Concern  . Not on file  Social History Narrative  . Not on file   Social  Determinants of Health   Financial Resource Strain: Not on file  Food Insecurity: Not on file  Transportation Needs: Not on file  Physical Activity: Not on file  Stress: Not on file  Social Connections: Not on file     Family History: The patient's family history includes Arrhythmia in her sister; CVA in her mother and sister; Heart attack in her sister; Heart disease in her sister; Heart disease (age of onset: 22) in her father.  ROS:   Please see the history of present illness.     All other systems reviewed and are negative.  EKGs/Labs/Other Studies Reviewed:    The following studies were reviewed today:  Echocardiogram 08/10/2020: 1. Left ventricular ejection fraction, by estimation, is 55 to 60%. The  left ventricle has normal function. The left ventricle has no regional  wall motion abnormalities. Left ventricular diastolic function could not  be evaluated.  2. Right ventricular systolic function is normal. The right ventricular  size is mildly enlarged. There is mildly elevated pulmonary artery  systolic pressure. The estimated right ventricular systolic pressure is  81.0 mmHg.  3. Left atrial size was mild to moderately dilated.  4. Right atrial size was mild to moderately dilated.  5. The mitral valve is grossly normal. Trivial mitral valve  regurgitation. No evidence of mitral stenosis.  6. The aortic valve is tricuspid. Aortic valve regurgitation is not  visualized. No aortic stenosis is present.  7. The inferior vena cava is dilated in size with >50% respiratory  variability, suggesting right atrial pressure of 8 mmHg.   Comparison(s): Changes from prior study are noted. Afib is now present. No  change in LV function.   EKG: Prior EKG from 07/07/2020 showed atrial fibrillation heart rate 81 bpm personally reviewed and interpreted  Recent Labs: 09/16/2019: ALT QUANTITY NOT SUFFICIENT, UNABLE TO PERFORM TEST 12/10/2019: BUN 7; Creatinine, Ser 0.75; Hemoglobin  12.1; Platelets 336; Potassium 4.4; Sodium 129  Recent Lipid Panel No results found for: CHOL, TRIG, HDL, CHOLHDL, VLDL, LDLCALC, LDLDIRECT   Risk Assessment/Calculations:      Physical Exam:    VS:  BP 140/70 (BP Location: Left Arm, Patient Position: Sitting, Cuff Size: Normal)   Pulse 70   Ht 5\' 4"  (1.626 m)   Wt 170 lb (77.1 kg)   LMP  (LMP Unknown)   SpO2 94%   BMI 29.18 kg/m     Wt Readings from Last 3 Encounters:  08/24/20 170 lb (77.1 kg)  07/07/20 168 lb (76.2 kg)  04/20/20 182 lb (82.6 kg)     GEN: Ambulates with a walker well nourished, well developed in no acute distress HEENT: Normal NECK: No JVD; No carotid bruits LYMPHATICS: No lymphadenopathy CARDIAC: Irregularly  irregular normal rate, no murmurs, rubs, gallops RESPIRATORY:  Clear to auscultation without rales, wheezing or rhonchi  ABDOMEN: Soft, non-tender, non-distended MUSCULOSKELETAL:  No edema; No deformity, right elbow Ace wrapped SKIN: Warm and dry NEUROLOGIC:  Alert and oriented x 3 PSYCHIATRIC:  Normal affect   ASSESSMENT:    1. Persistent atrial fibrillation ([place])   2. Primary hypertension    PLAN:    In order of problems listed above:  Persistent atrial fibrillation - Asymptomatic, on Eliquis 5 mg twice a day medical management.  Continue.  On metoprolol 100 mg 3 times a day.  No longer on aspirin. - Echocardiogram reassuring showing normal structure and function.  CHA2DS2-VASc score of 5.  Given good overall rate control, not unreasonable to continue with rate control strategy given her asymptomatic nature. --Sister had CVA, Carotid dz. Mother died of CVA in her sleep.  -Since she is doing well with rate control strategy and is relatively asymptomatic, except for at night she may feel a few skips when laying down for bed, we will continue with rate control.  I offered her the possibility of cardioversion however she is not terribly interested in this idea.  Her sister did have several  cardioversions.  Chronic anticoagulation - On Eliquis.  Monitoring labs  Last creatinine 0.75 hemoglobin 12.3  Essential hypertension - Blood pressure under good control at home.  Here slightly elevated.  Dr. Radene Ou has been following.  Hyperthyroidism --Dr. Buddy Duty. Methimazole 10mg  PO QD. they are monitoring her TSH.  Last check on 07/19/2020 it was 0.02.  Contributing to her atrial fibrillation as well.  DM --Dr. Buddy Duty.  Hemoglobin A1c 6.6.  6 month follow up   Medication Adjustments/Labs and Tests Ordered: Current medicines are reviewed at length with the patient today.  Concerns regarding medicines are outlined above.  No orders of the defined types were placed in this encounter.  No orders of the defined types were placed in this encounter.   Patient Instructions  Medication Instructions:  The current medical regimen is effective;  continue present plan and medications.  *If you need a refill on your cardiac medications before your next appointment, please call your pharmacy*  Follow-Up: At [facility], you and your health needs are our priority.  As part of our continuing mission to provide you with exceptional heart care, we have created designated Provider Care Teams.  These Care Teams include your primary Cardiologist (physician) and Advanced Practice Providers (APPs -  Physician Assistants and Nurse Practitioners) who all work together to provide you with the care you need, when you need it.  We recommend signing up for the patient portal called "MyChart".  Sign up information is provided on this After Visit Summary.  MyChart is used to connect with patients for Virtual Visits (Telemedicine).  Patients are able to view lab/test results, encounter notes, upcoming appointments, etc.  Non-urgent messages can be sent to your provider as well.   To learn more about what you can do with MyChart, go to [email or web address].    Your next appointment:   6 month(s)  The  format for your next appointment:   In Person  Provider:   Candee Furbish, MD   Thank you for choosing [facility]!!        Signed, Candee Furbish, MD  08/24/2020 11:50 AM    [place]

## 2020-08-24 NOTE — Patient Instructions (Signed)

## 2020-09-03 DIAGNOSIS — E0500 Thyrotoxicosis with diffuse goiter without thyrotoxic crisis or storm: Secondary | ICD-10-CM | POA: Diagnosis not present

## 2020-09-03 DIAGNOSIS — E0590 Thyrotoxicosis, unspecified without thyrotoxic crisis or storm: Secondary | ICD-10-CM | POA: Diagnosis not present

## 2020-10-04 DIAGNOSIS — M810 Age-related osteoporosis without current pathological fracture: Secondary | ICD-10-CM | POA: Diagnosis not present

## 2020-10-04 DIAGNOSIS — I10 Essential (primary) hypertension: Secondary | ICD-10-CM | POA: Diagnosis not present

## 2020-10-04 DIAGNOSIS — E0590 Thyrotoxicosis, unspecified without thyrotoxic crisis or storm: Secondary | ICD-10-CM | POA: Diagnosis not present

## 2020-10-04 DIAGNOSIS — I4891 Unspecified atrial fibrillation: Secondary | ICD-10-CM | POA: Diagnosis not present

## 2020-10-04 DIAGNOSIS — E0500 Thyrotoxicosis with diffuse goiter without thyrotoxic crisis or storm: Secondary | ICD-10-CM | POA: Diagnosis not present

## 2020-11-29 ENCOUNTER — Emergency Department (HOSPITAL_BASED_OUTPATIENT_CLINIC_OR_DEPARTMENT_OTHER): Payer: Medicare HMO

## 2020-11-29 ENCOUNTER — Other Ambulatory Visit: Payer: Self-pay

## 2020-11-29 ENCOUNTER — Encounter (HOSPITAL_BASED_OUTPATIENT_CLINIC_OR_DEPARTMENT_OTHER): Payer: Self-pay

## 2020-11-29 ENCOUNTER — Inpatient Hospital Stay (HOSPITAL_BASED_OUTPATIENT_CLINIC_OR_DEPARTMENT_OTHER)
Admission: EM | Admit: 2020-11-29 | Discharge: 2020-12-02 | DRG: 872 | Disposition: A | Discharge status: Home / Self Care | Payer: Medicare HMO | Attending: Internal Medicine | Admitting: Internal Medicine

## 2020-11-29 DIAGNOSIS — Z20822 Contact with and (suspected) exposure to covid-19: Secondary | ICD-10-CM | POA: Diagnosis present

## 2020-11-29 DIAGNOSIS — H409 Unspecified glaucoma: Secondary | ICD-10-CM | POA: Diagnosis not present

## 2020-11-29 DIAGNOSIS — Z7901 Long term (current) use of anticoagulants: Secondary | ICD-10-CM

## 2020-11-29 DIAGNOSIS — R000 Tachycardia, unspecified: Secondary | ICD-10-CM | POA: Diagnosis present

## 2020-11-29 DIAGNOSIS — M1990 Unspecified osteoarthritis, unspecified site: Secondary | ICD-10-CM | POA: Diagnosis present

## 2020-11-29 DIAGNOSIS — Z823 Family history of stroke: Secondary | ICD-10-CM | POA: Diagnosis not present

## 2020-11-29 DIAGNOSIS — H353131 Nonexudative age-related macular degeneration, bilateral, early dry stage: Secondary | ICD-10-CM | POA: Diagnosis present

## 2020-11-29 DIAGNOSIS — I10 Essential (primary) hypertension: Secondary | ICD-10-CM | POA: Diagnosis not present

## 2020-11-29 DIAGNOSIS — Z981 Arthrodesis status: Secondary | ICD-10-CM

## 2020-11-29 DIAGNOSIS — Z888 Allergy status to other drugs, medicaments and biological substances status: Secondary | ICD-10-CM

## 2020-11-29 DIAGNOSIS — I517 Cardiomegaly: Secondary | ICD-10-CM | POA: Diagnosis not present

## 2020-11-29 DIAGNOSIS — E872 Acidosis: Secondary | ICD-10-CM | POA: Diagnosis not present

## 2020-11-29 DIAGNOSIS — Z7983 Long term (current) use of bisphosphonates: Secondary | ICD-10-CM

## 2020-11-29 DIAGNOSIS — M8580 Other specified disorders of bone density and structure, unspecified site: Secondary | ICD-10-CM | POA: Diagnosis present

## 2020-11-29 DIAGNOSIS — L0390 Cellulitis, unspecified: Secondary | ICD-10-CM | POA: Diagnosis present

## 2020-11-29 DIAGNOSIS — A419 Sepsis, unspecified organism: Secondary | ICD-10-CM | POA: Diagnosis present

## 2020-11-29 DIAGNOSIS — E785 Hyperlipidemia, unspecified: Secondary | ICD-10-CM | POA: Diagnosis not present

## 2020-11-29 DIAGNOSIS — J811 Chronic pulmonary edema: Secondary | ICD-10-CM | POA: Diagnosis not present

## 2020-11-29 DIAGNOSIS — E1165 Type 2 diabetes mellitus with hyperglycemia: Secondary | ICD-10-CM | POA: Diagnosis present

## 2020-11-29 DIAGNOSIS — I480 Paroxysmal atrial fibrillation: Secondary | ICD-10-CM | POA: Diagnosis present

## 2020-11-29 DIAGNOSIS — E0590 Thyrotoxicosis, unspecified without thyrotoxic crisis or storm: Secondary | ICD-10-CM | POA: Diagnosis not present

## 2020-11-29 DIAGNOSIS — E871 Hypo-osmolality and hyponatremia: Secondary | ICD-10-CM | POA: Diagnosis not present

## 2020-11-29 DIAGNOSIS — Z794 Long term (current) use of insulin: Secondary | ICD-10-CM

## 2020-11-29 DIAGNOSIS — L03114 Cellulitis of left upper limb: Secondary | ICD-10-CM

## 2020-11-29 DIAGNOSIS — R509 Fever, unspecified: Secondary | ICD-10-CM | POA: Diagnosis not present

## 2020-11-29 DIAGNOSIS — Z79899 Other long term (current) drug therapy: Secondary | ICD-10-CM | POA: Diagnosis not present

## 2020-11-29 DIAGNOSIS — Z8249 Family history of ischemic heart disease and other diseases of the circulatory system: Secondary | ICD-10-CM | POA: Diagnosis not present

## 2020-11-29 DIAGNOSIS — I4811 Longstanding persistent atrial fibrillation: Secondary | ICD-10-CM

## 2020-11-29 DIAGNOSIS — M7989 Other specified soft tissue disorders: Secondary | ICD-10-CM | POA: Diagnosis not present

## 2020-11-29 DIAGNOSIS — L03119 Cellulitis of unspecified part of limb: Secondary | ICD-10-CM

## 2020-11-29 DIAGNOSIS — E119 Type 2 diabetes mellitus without complications: Secondary | ICD-10-CM

## 2020-11-29 DIAGNOSIS — M79642 Pain in left hand: Secondary | ICD-10-CM | POA: Diagnosis not present

## 2020-11-29 LAB — GLUCOSE, CAPILLARY
Glucose-Capillary: 72 mg/dL (ref 70–99)
Glucose-Capillary: 123 mg/dL — ABNORMAL HIGH (ref 70–99)

## 2020-11-29 LAB — URINALYSIS, MICROSCOPIC (REFLEX): RBC / HPF: >50 RBC/hpf (ref 0–5)

## 2020-11-29 LAB — CBC WITH DIFFERENTIAL/PLATELET
Abs Immature Granulocytes: 0.05 10*3/uL (ref 0.00–0.07)
Basophils Absolute: 0 10*3/uL (ref 0.0–0.1)
Basophils Relative: 0 %
Eosinophils Absolute: 0.1 10*3/uL (ref 0.0–0.5)
Eosinophils Relative: 1 %
HCT: 40.6 % (ref 36.0–46.0)
Hemoglobin: 13.3 g/dL (ref 12.0–15.0)
Immature Granulocytes: 0 %
Lymphocytes Relative: 13 %
Lymphs Abs: 1.6 10*3/uL (ref 0.7–4.0)
MCH: 27.8 pg (ref 26.0–34.0)
MCHC: 32.8 g/dL (ref 30.0–36.0)
MCV: 84.9 fL (ref 80.0–100.0)
Monocytes Absolute: 1.1 10*3/uL — ABNORMAL HIGH (ref 0.1–1.0)
Monocytes Relative: 9 %
Neutro Abs: 9.4 10*3/uL — ABNORMAL HIGH (ref 1.7–7.7)
Neutrophils Relative %: 77 %
Platelets: 232 10*3/uL (ref 150–400)
RBC: 4.78 MIL/uL (ref 3.87–5.11)
RDW: 18 % — ABNORMAL HIGH (ref 11.5–15.5)
WBC: 12.3 10*3/uL — ABNORMAL HIGH (ref 4.0–10.5)
nRBC: 0 % (ref 0.0–0.2)

## 2020-11-29 LAB — RESP PANEL BY RT-PCR (FLU A&B, COVID) ARPGX2
Influenza A by PCR: NEGATIVE
Influenza B by PCR: NEGATIVE
SARS Coronavirus 2 by RT PCR: NEGATIVE

## 2020-11-29 LAB — C-REACTIVE PROTEIN
CRP: 17.4 mg/dL — ABNORMAL HIGH (ref <1.0)
CRP: 18 mg/dL — ABNORMAL HIGH (ref <1.0)

## 2020-11-29 LAB — COMPREHENSIVE METABOLIC PANEL
ALT: 20 U/L (ref 0–44)
AST: 25 U/L (ref 15–41)
Albumin: 3.5 g/dL (ref 3.5–5.0)
Alkaline Phosphatase: 69 U/L (ref 38–126)
Anion gap: 12 (ref 5–15)
BUN: 18 mg/dL (ref 8–23)
CO2: 24 mmol/L (ref 22–32)
Calcium: 9.5 mg/dL (ref 8.9–10.3)
Chloride: 95 mmol/L — ABNORMAL LOW (ref 98–111)
Creatinine, Ser: 0.75 mg/dL (ref 0.44–1.00)
GFR, Estimated: >60 mL/min (ref >60)
Glucose, Bld: 256 mg/dL — ABNORMAL HIGH (ref 70–99)
Potassium: 4.2 mmol/L (ref 3.5–5.1)
Sodium: 131 mmol/L — ABNORMAL LOW (ref 135–145)
Total Bilirubin: 1.4 mg/dL — ABNORMAL HIGH (ref 0.3–1.2)
Total Protein: 7.9 g/dL (ref 6.5–8.1)

## 2020-11-29 LAB — URINALYSIS, ROUTINE W REFLEX MICROSCOPIC
Bilirubin Urine: NEGATIVE
Glucose, UA: 100 mg/dL — AB
Ketones, ur: NEGATIVE mg/dL
Nitrite: NEGATIVE
Protein, ur: NEGATIVE mg/dL
Specific Gravity, Urine: 1.01 (ref 1.005–1.030)
pH: 6.5 (ref 5.0–8.0)

## 2020-11-29 LAB — LACTIC ACID, PLASMA
Lactic Acid, Venous: 3.3 mmol/L (ref 0.5–1.9)
Lactic Acid, Venous: 2.6 mmol/L (ref 0.5–1.9)
Lactic Acid, Venous: 2.4 mmol/L (ref 0.5–1.9)
Lactic Acid, Venous: 1.6 mmol/L (ref 0.5–1.9)

## 2020-11-29 LAB — URIC ACID
Uric Acid, Serum: 3.7 mg/dL (ref 2.5–7.1)
Uric Acid, Serum: 3.7 mg/dL (ref 2.5–7.1)

## 2020-11-29 LAB — APTT: aPTT: 40 seconds — ABNORMAL HIGH (ref 24–36)

## 2020-11-29 LAB — PROCALCITONIN: Procalcitonin: <0.1 ng/mL

## 2020-11-29 LAB — SEDIMENTATION RATE: Sed Rate: 54 mm/hr — ABNORMAL HIGH (ref 0–22)

## 2020-11-29 LAB — PROTIME-INR
INR: 1.6 — ABNORMAL HIGH (ref 0.8–1.2)
Prothrombin Time: 19.2 seconds — ABNORMAL HIGH (ref 11.4–15.2)

## 2020-11-29 LAB — CBG MONITORING, ED: Glucose-Capillary: 157 mg/dL — ABNORMAL HIGH (ref 70–99)

## 2020-11-29 MED ORDER — HYDRALAZINE HCL 25 MG PO TABS
25.0000 mg | ORAL_TABLET | Freq: Four times a day (QID) | ORAL | Status: DC | PRN
  Administered 2020-11-29: 25 mg via ORAL
  Filled 2020-11-29: qty 1

## 2020-11-29 MED ORDER — ONDANSETRON HCL 4 MG PO TABS: 4.0000 mg | ORAL_TABLET | Freq: Four times a day (QID) | ORAL | Status: DC | PRN

## 2020-11-29 MED ORDER — ACETAMINOPHEN 325 MG PO TABS: 650.0000 mg | ORAL_TABLET | Freq: Four times a day (QID) | ORAL | Status: DC | PRN

## 2020-11-29 MED ORDER — DORZOLAMIDE HCL 2 % OP SOLN
1.0000 [drp] | Freq: Two times a day (BID) | OPHTHALMIC | Status: DC
  Administered 2020-11-29 – 2020-12-02 (×6): 1 [drp] via OPHTHALMIC
  Filled 2020-11-29: qty 10

## 2020-11-29 MED ORDER — LATANOPROST 0.005 % OP SOLN
1.0000 [drp] | Freq: Every day | OPHTHALMIC | Status: DC
  Administered 2020-11-29 – 2020-12-01 (×3): 1 [drp] via OPHTHALMIC
  Filled 2020-11-29: qty 2.5

## 2020-11-29 MED ORDER — APIXABAN 5 MG PO TABS
5.0000 mg | ORAL_TABLET | Freq: Two times a day (BID) | ORAL | Status: DC
  Administered 2020-11-29 – 2020-12-02 (×6): 5 mg via ORAL
  Filled 2020-11-29 (×6): qty 1

## 2020-11-29 MED ORDER — SENNOSIDES-DOCUSATE SODIUM 8.6-50 MG PO TABS: 1.0000 | ORAL_TABLET | Freq: Every evening | ORAL | Status: DC | PRN

## 2020-11-29 MED ORDER — HYDROCHLOROTHIAZIDE 25 MG PO TABS
25.0000 mg | ORAL_TABLET | Freq: Every day | ORAL | Status: DC
  Administered 2020-11-30: 25 mg via ORAL
  Filled 2020-11-29: qty 1

## 2020-11-29 MED ORDER — RAMIPRIL 10 MG PO CAPS
20.0000 mg | ORAL_CAPSULE | Freq: Every day | ORAL | Status: DC
  Administered 2020-11-30 – 2020-12-02 (×3): 20 mg via ORAL
  Filled 2020-11-29 (×3): qty 2

## 2020-11-29 MED ORDER — METOPROLOL TARTRATE 50 MG PO TABS
100.0000 mg | ORAL_TABLET | Freq: Two times a day (BID) | ORAL | Status: DC
  Administered 2020-11-29 – 2020-12-02 (×6): 100 mg via ORAL
  Filled 2020-11-29: qty 2
  Filled 2020-11-29: qty 4
  Filled 2020-11-29 (×4): qty 2

## 2020-11-29 MED ORDER — ACETAMINOPHEN 650 MG RE SUPP: 650.0000 mg | Freq: Four times a day (QID) | RECTAL | Status: DC | PRN

## 2020-11-29 MED ORDER — ATORVASTATIN CALCIUM 40 MG PO TABS
40.0000 mg | ORAL_TABLET | Freq: Every day | ORAL | Status: DC
  Administered 2020-11-30 – 2020-12-02 (×3): 40 mg via ORAL
  Filled 2020-11-29 (×4): qty 1

## 2020-11-29 MED ORDER — HYDROCODONE-ACETAMINOPHEN 5-325 MG PO TABS
1.0000 | ORAL_TABLET | Freq: Four times a day (QID) | ORAL | Status: DC | PRN
Start: 2020-11-29 — End: 2020-12-02

## 2020-11-29 MED ORDER — SODIUM CHLORIDE 0.9 % IV BOLUS
1000.0000 mL | Freq: Once | INTRAVENOUS | Status: AC
  Administered 2020-11-29: 1000 mL via INTRAVENOUS

## 2020-11-29 MED ORDER — ONDANSETRON HCL 4 MG/2ML IJ SOLN: 4.0000 mg | Freq: Four times a day (QID) | INTRAMUSCULAR | Status: DC | PRN

## 2020-11-29 MED ORDER — METHIMAZOLE 10 MG PO TABS
10.0000 mg | ORAL_TABLET | Freq: Every day | ORAL | Status: DC
  Administered 2020-11-30 – 2020-12-02 (×3): 10 mg via ORAL
  Filled 2020-11-29 (×3): qty 1

## 2020-11-29 MED ORDER — INSULIN ASPART 100 UNIT/ML IJ SOLN: 0.0000 [IU] | Freq: Every day | INTRAMUSCULAR | Status: DC

## 2020-11-29 MED ORDER — INSULIN ASPART 100 UNIT/ML IJ SOLN
0.0000 [IU] | Freq: Three times a day (TID) | INTRAMUSCULAR | Status: DC
  Administered 2020-11-30: 1 [IU] via SUBCUTANEOUS
  Administered 2020-11-30 – 2020-12-01 (×4): 2 [IU] via SUBCUTANEOUS
  Administered 2020-12-01: 1 [IU] via SUBCUTANEOUS
  Administered 2020-12-02 (×2): 2 [IU] via SUBCUTANEOUS

## 2020-11-29 MED ORDER — INSULIN ASPART PROT & ASPART (70-30 MIX) 100 UNIT/ML ~~LOC~~ SUSP
12.0000 [IU] | Freq: Two times a day (BID) | SUBCUTANEOUS | Status: DC
  Administered 2020-11-29 – 2020-11-30 (×2): 12 [IU] via SUBCUTANEOUS
  Filled 2020-11-29: qty 10

## 2020-11-29 MED ORDER — VANCOMYCIN HCL IN DEXTROSE 1-5 GM/200ML-% IV SOLN
1000.0000 mg | Freq: Once | INTRAVENOUS | Status: AC
  Administered 2020-11-29: 1000 mg via INTRAVENOUS
  Filled 2020-11-29: qty 200

## 2020-11-29 MED ORDER — SODIUM CHLORIDE 0.9 % IV SOLN
1.0000 g | INTRAVENOUS | Status: DC
  Administered 2020-11-29 – 2020-11-30 (×2): 1 g via INTRAVENOUS
  Filled 2020-11-29 (×2): qty 10

## 2020-11-29 MED ORDER — LACTATED RINGERS IV SOLN: INTRAVENOUS | Status: AC

## 2020-11-29 MED ORDER — LEVOFLOXACIN IN D5W 750 MG/150ML IV SOLN: 750.0000 mg | Freq: Once | INTRAVENOUS | Status: DC

## 2020-11-29 MED ORDER — VANCOMYCIN HCL 750 MG/150ML IV SOLN
750.0000 mg | Freq: Two times a day (BID) | INTRAVENOUS | Status: DC
  Administered 2020-11-29 – 2020-11-30 (×2): 750 mg via INTRAVENOUS
  Filled 2020-11-29 (×3): qty 150

## 2020-11-29 NOTE — ED Provider Notes (Signed)
[facility] EMERGENCY DEPARTMENT Provider Note   CSN: [id number] Arrival date & time: 11/29/20  [id number]     History Chief Complaint  Patient presents with   Hand Pain    Kayla Griffin is a 80 y.o. female.  Patient with pain and swelling to the back of the left hand, left arm for last 3 days.  Denies any injury.  She is a diabetic.  Patient is on blood thinners for atrial fibrillation.  The history is provided by the patient.  Hand Pain This is a new problem. The current episode started more than 2 days ago. The problem occurs constantly. The problem has been gradually worsening. Pertinent negatives include no chest pain, no abdominal pain, no headaches and no shortness of breath. Nothing aggravates the symptoms. Nothing relieves the symptoms. She has tried nothing for the symptoms. The treatment provided no relief.      Past Medical History:  Diagnosis Date   Ambulates with cane    Arthritis    Cancer ([place])    Endometrial - hysterectomy   Depression    Diabetes mellitus without complication ([place])    Type II   Dyspnea    currently with arm pain 08/22/19   Glaucoma    HLD (hyperlipidemia)    Hypertension    Wears dentures     Patient Active Problem List   Diagnosis Date Noted   Nonproliferative diabetic retinopathy of both eyes ([place]) 01/06/2020   Degenerative retinal drusen of both eyes 01/06/2020   Posterior vitreous detachment of left eye 01/06/2020   Early stage nonexudative age-related macular degeneration of both eyes 01/06/2020   Postoperative infection    Abscess of right upper arm and forearm 09/15/2019   Cellulitis of right upper extremity 09/14/2019   Insulin-requiring or dependent type II diabetes mellitus ([place]) 09/14/2019   Hyponatremia    Right supracondylar humerus fracture, closed, initial encounter 08/20/2019   Abnormal EKG 08/10/2016   Hypertension 08/10/2016    Past Surgical History:  Procedure Laterality Date   ABDOMINAL  HYSTERECTOMY     CERVICAL FUSION  2007   EYE SURGERY Bilateral    cataracts   I & D EXTREMITY Right 09/15/2019   Procedure: IRRIGATION AND DEBRIDEMENT ELBOW;  Surgeon: Leandrew Koyanagi, MD;  Location: [facility];  Service: Orthopedics;  Laterality: Right;   IRRIGATION AND DEBRIDEMENT ELBOW Right 12/11/2019   Procedure: RIGHT ELBOW IRRIGATION AND DEBRIDEMENT;  Surgeon: Leandrew Koyanagi, MD;  Location: [facility];  Service: Orthopedics;  Laterality: Right;   ORIF HUMERUS FRACTURE Right 08/25/2019   Procedure: OPEN REDUCTION INTERNAL FIXATION (ORIF) RIGHT SUPRACONDYLAR HUMERUS FRACTURE, NEUROLYSIS ULNAR NERVE;  Surgeon: Leandrew Koyanagi, MD;  Location: [facility];  Service: Orthopedics;  Laterality: Right;   ORIF PATELLA Right 07/07/2016   Procedure: Partial Patellectomy;  Surgeon: Leandrew Koyanagi, MD;  Location: [facility];  Service: Orthopedics;  Laterality: Right;     OB History   No obstetric history on file.     Family History  Problem Relation Age of Onset   CVA Mother    Heart disease Father 50   Heart attack Sister    Heart disease Sister    Arrhythmia Sister    CVA Sister     Social History   Tobacco Use   Smoking status: Never   Smokeless tobacco: Never  Vaping Use   Vaping Use: Never used  Substance Use Topics   Alcohol use: No   Drug use: No  Home Medications Prior to Admission medications   Medication Sig Start Date End Date Taking? Authorizing Provider  alendronate (FOSAMAX) 70 MG tablet Take 70 mg by mouth every Sunday. Take with a full glass of water on an empty stomach.    [provider]  apixaban (ELIQUIS) 5 MG TABS tablet Take 1 tablet (5 mg total) by mouth 2 (two) times daily. 07/07/20   Burnell Blanks, MD  atorvastatin (LIPITOR) 40 MG tablet Take 40 mg by mouth daily.  05/16/16   [provider]  Calcium Carbonate-Vit D-Min (CALCIUM 1200 PO) Take 1,200 mg by mouth daily.    [provider]  collagenase (SANTYL) ointment Apply 1  application topically daily. 12/18/19   Leandrew Koyanagi, MD  dorzolamide (TRUSOPT) 2 % ophthalmic solution Place 1 drop into both eyes 2 (two) times daily.     [provider]  hydrochlorothiazide (HYDRODIURIL) 25 MG tablet Take 25 mg by mouth daily.     [provider]  insulin NPH-regular Human (NOVOLIN 70/30) (70-30) 100 UNIT/ML injection Inject into the skin 2 (two) times daily with a meal. TAKE 15U IN AM AND 15U IN PM    [provider]  latanoprost (XALATAN) 0.005 % ophthalmic solution Place 1 drop into both eyes at bedtime.  06/15/16   [provider]  metFORMIN (GLUCOPHAGE) 500 MG tablet Take 500 mg by mouth 2 (two) times daily with a meal.    [provider]  methimazole (TAPAZOLE) 10 MG tablet Take 10 mg by mouth daily.    [provider]  metoprolol (LOPRESSOR) 100 MG tablet Take 100 mg by mouth 3 (three) times daily.    [provider]  ramipril (ALTACE) 10 MG capsule Take 20 mg by mouth daily.  05/16/16   [provider]    Allergies    Actos [pioglitazone] and Cephalexin  Review of Systems   Review of Systems  Constitutional:  Negative for chills and fever.  HENT:  Negative for ear pain and sore throat.   Eyes:  Negative for pain and visual disturbance.  Respiratory:  Negative for cough and shortness of breath.   Cardiovascular:  Negative for chest pain and palpitations.  Gastrointestinal:  Negative for abdominal pain and vomiting.  Genitourinary:  Negative for dysuria and hematuria.  Musculoskeletal:  Positive for arthralgias. Negative for back pain.  Skin:  Positive for color change. Negative for rash.  Neurological:  Negative for seizures, syncope and headaches.  All other systems reviewed and are negative.  Physical Exam Updated Vital Signs BP (!) 165/89 (BP Location: Right Arm)   Pulse 95   Temp 100 F (37.8 C) (Oral)   Resp (!) 22   Ht '5\' 4"'$  (1.626 m)   Wt 73.9 kg   LMP  (LMP Unknown)   SpO2 95%    BMI 27.98 kg/m   Physical Exam Vitals and nursing note reviewed.  Constitutional:      General: She is not in acute distress.    Appearance: She is well-developed. She is not ill-appearing.  HENT:     Head: Normocephalic and atraumatic.     Nose: Nose normal.     Mouth/Throat:     Mouth: Mucous membranes are moist.  Eyes:     Extraocular Movements: Extraocular movements intact.     Conjunctiva/sclera: Conjunctivae normal.     Pupils: Pupils are equal, round, and reactive to light.  Cardiovascular:     Rate and Rhythm: Normal rate and regular rhythm.  Pulses: Normal pulses.     Heart sounds: Normal heart sounds. No murmur heard. Pulmonary:     Effort: Pulmonary effort is normal. No respiratory distress.     Breath sounds: Normal breath sounds.  Abdominal:     Palpations: Abdomen is soft.     Tenderness: There is no abdominal tenderness.  Musculoskeletal:        General: Swelling and tenderness present.     Cervical back: Neck supple.     Comments: Pain with range of motion of her left fingers and left wrist, there is swelling mostly to the dorsum of the left hand with some erythema and warmth that extends up through the left wrist and into the forearm with some red streaking  Skin:    General: Skin is warm and dry.     Capillary Refill: Capillary refill takes less than 2 seconds.  Neurological:     General: No focal deficit present.     Mental Status: She is alert.     Sensory: No sensory deficit.     Motor: No weakness.    ED Results / Procedures / Treatments   Labs (all labs ordered are listed, but only abnormal results are displayed) Labs Reviewed  CBC WITH DIFFERENTIAL/PLATELET - Abnormal; Notable for the following components:      Result Value   WBC 12.3 (*)    RDW 18.0 (*)    Neutro Abs 9.4 (*)    Monocytes Absolute 1.1 (*)    All other components within normal limits  COMPREHENSIVE METABOLIC PANEL - Abnormal; Notable for the following components:    Sodium 131 (*)    Chloride 95 (*)    Glucose, Bld 256 (*)    Total Bilirubin 1.4 (*)    All other components within normal limits  LACTIC ACID, PLASMA - Abnormal; Notable for the following components:   Lactic Acid, Venous 3.3 (*)    All other components within normal limits  PROTIME-INR - Abnormal; Notable for the following components:   Prothrombin Time 19.2 (*)    INR 1.6 (*)    All other components within normal limits  APTT - Abnormal; Notable for the following components:   aPTT 40 (*)    All other components within normal limits  RESP PANEL BY RT-PCR (FLU A&B, COVID) ARPGX2  CULTURE, BLOOD (ROUTINE X 2)  CULTURE, BLOOD (ROUTINE X 2)  LACTIC ACID, PLASMA  URINALYSIS, ROUTINE W REFLEX MICROSCOPIC  SEDIMENTATION RATE  C-REACTIVE PROTEIN    EKG EKG Interpretation  Date/Time:  Monday November 29 2020 10:26:55 EDT Ventricular Rate:  85 PR Interval:    QRS Duration: 126 QT Interval:  395 QTC Calculation: 445 R Axis:   -19 Text Interpretation: Atrial fibrillation Ventricular premature complex LVH with secondary repolarization abnormality Anterior Q waves, possibly due to LVH Confirmed by Ronnald Nian, Zakar Brosch ([id number]) on 11/29/2020 10:57:12 AM  Radiology DG Wrist Complete Left  Result Date: 11/29/2020 CLINICAL DATA:  Hand and wrist pain, swelling, and redness, no known injury EXAM: LEFT HAND - COMPLETE 3+ VIEW; LEFT WRIST - COMPLETE 3+ VIEW COMPARISON:  None. FINDINGS: Osteopenia. There is no evidence of acute fracture or dislocation. Probable chronic, callused fracture deformity of the distal left radial metadiaphysis. Generally mild osteoarthritic pattern arthrosis about the hand and wrist. Diffuse soft tissue edema about the hand and wrist. IMPRESSION: 1. No evidence of acute fracture or dislocation. Probable chronic, callused fracture deformity of the distal left radial metadiaphysis. 2. Diffuse soft tissue edema about the  hand and wrist. 3. Mild osteoarthritic pattern arthrosis. 4.  Osteopenia. Electronically Signed   By: Eddie Candle M.D.   On: 11/29/2020 11:13   DG Hand Complete Left  Result Date: 11/29/2020 CLINICAL DATA:  Hand and wrist pain, swelling, and redness, no known injury EXAM: LEFT HAND - COMPLETE 3+ VIEW; LEFT WRIST - COMPLETE 3+ VIEW COMPARISON:  None. FINDINGS: Osteopenia. There is no evidence of acute fracture or dislocation. Probable chronic, callused fracture deformity of the distal left radial metadiaphysis. Generally mild osteoarthritic pattern arthrosis about the hand and wrist. Diffuse soft tissue edema about the hand and wrist. IMPRESSION: 1. No evidence of acute fracture or dislocation. Probable chronic, callused fracture deformity of the distal left radial metadiaphysis. 2. Diffuse soft tissue edema about the hand and wrist. 3. Mild osteoarthritic pattern arthrosis. 4. Osteopenia. Electronically Signed   By: Eddie Candle M.D.   On: 11/29/2020 11:13    Procedures .Critical Care  Date/Time: 11/29/2020 11:20 AM Performed by: Lennice Sites, DO Authorized by: Lennice Sites, DO   Critical care provider statement:    Critical care time (minutes):  40   Critical care was necessary to treat or prevent imminent or life-threatening deterioration of the following conditions:  Sepsis   Critical care was time spent personally by me on the following activities:  Blood draw for specimens, development of treatment plan with patient or surrogate, discussions with primary provider, evaluation of patient's response to treatment, examination of patient, obtaining history from patient or surrogate, ordering and performing treatments and interventions, ordering and review of laboratory studies, ordering and review of radiographic studies, re-evaluation of patient's condition, pulse oximetry and review of old charts   Care discussed with: admitting provider     Medications Ordered in ED Medications  lactated ringers infusion ( Intravenous New Bag/Given 11/29/20 1047)   vancomycin (VANCOCIN) IVPB 1000 mg/200 mL premix (has no administration in time range)  cefTRIAXone (ROCEPHIN) 1 g in sodium chloride 0.9 % 100 mL IVPB (1 g Intravenous New Bag/Given 11/29/20 1105)  vancomycin (VANCOREADY) IVPB 750 mg/150 mL (has no administration in time range)  sodium chloride 0.9 % bolus 1,000 mL (1,000 mLs Intravenous New Bag/Given 11/29/20 1107)    ED Course  I have reviewed the triage vital signs and the nursing notes.  Pertinent labs & imaging results that were available during my care of the patient were reviewed by me and considered in my medical decision making (see chart for details).    MDM Rules/Calculators/A&P                           Kayla Griffin is here with pain and swelling to her left hand/wrist.  Patient with low-grade fever.  Otherwise unremarkable vitals.  Patient with history of diabetes, atrial fibrillation on Eliquis.  Has redness and swelling to the dorsum of the left hand that streaks up through the wrist into her forearm.  There is warmth.  This could be pseudogout versus gout versus infection/cellulitis.  No surgical hardware in this area.  Does not appear to have any fluctuance to suggest abscess.  Sepsis order set given heart rate above 90 and low-grade temperature.  We will start broad-spectrum IV antibiotics for suspected cellulitis of her left upper extremity.  Given that she is diabetic and there is some streaking up the forearm will admit for observation for antibiotics.  There is mild leukocytosis of 12.  Lactic acidosis of 3.3.  X-ray concerning  for soft tissue swelling of the dorsum of the left hand and wrist.  Overall she has good range of motion but with discomfort and suspect that this is all soft tissue.  There is no obvious joint effusion on x-rays.  Lab work otherwise unremarkable and will admit to medicine for further antibiotics.  This chart was dictated using voice recognition software.  Despite best efforts to proofread,   errors can occur which can change the documentation meaning.   Final Clinical Impression(s) / ED Diagnoses Final diagnoses:  Sepsis, due to unspecified organism, unspecified whether acute organ dysfunction present [facility])  Cellulitis of hand    Rx / DC Orders ED Discharge Orders     None        Lennice Sites, DO 11/29/20 1123

## 2020-11-29 NOTE — ED Triage Notes (Signed)
Pt c/o left hand pain, redness and swelling starting Saturday. Denies known injury. Temp 162fduring triage

## 2020-11-29 NOTE — Consult Note (Signed)
80y/o female sent in for obs stay for iv abx due to left hand/wrist cellulitis that began Saturday.  Pt discussed w/ Dr. Ronnald Nian.  No home meds tried.  White count of 12 lactic acid 3.3 x-ray concerning for soft tissue swelling dorsum left hand and wrist good range of motion per ED provider.  Patient has history of diabetes and is on Eliquis for A. fib.  Checking lactic acid.  Rocephin, LR and Vanco ordered at outside hospital.  Accepted to med telemetry bed.

## 2020-11-29 NOTE — Progress Notes (Signed)
Pharmacy Antibiotic Note  Kayla Griffin is a 80 y.o. female admitted on 11/29/2020 with cellulitis. Pharmacy has been consulted for vancomycin dosing. Pt with Tmax 100 and WBC is elevated at 12.3. Scr is WNL. Lactic acid is elevated at 3.3.   Plan: Vancomycin 1gm IV x 1 then '750mg'$  IV Q12H F/u renal fxn, C&S, clinical status and peak/trough at SS  Height: '5\' 4"'$  (162.6 cm) Weight: 73.9 kg (163 lb) IBW/kg (Calculated) : 54.7  Temp (24hrs), Avg:100 F (37.8 C), Min:100 F (37.8 C), Max:100 F (37.8 C)  Recent Labs  Lab 11/29/20 1009 11/29/20 1023  WBC 12.3*  --   CREATININE 0.75  --   LATICACIDVEN  --  3.3*    Estimated Creatinine Clearance: 55.3 mL/min (by C-G formula based on SCr of 0.75 mg/dL).    Allergies  Allergen Reactions   Actos [Pioglitazone] Swelling    SWELLING REACTION UNSPECIFIED    Cephalexin Nausea And Vomiting    Antimicrobials this admission: Vanc 8/22>> CTX 8/22>>  Dose adjustments this admission: N/A  Microbiology results: Pending  Thank you for allowing pharmacy to be a part of this patient's care.  Tirza Senteno, Rande Lawman 11/29/2020 10:30 AM

## 2020-11-29 NOTE — ED Notes (Signed)
[facility] at bedside 

## 2020-11-29 NOTE — Sepsis Progress Note (Signed)
Notified bedside nurse of need to draw repeat lactic acid. 

## 2020-11-29 NOTE — H&P (Signed)
History and Physical    LILYANN GRAVELLE OHY:[id number] DOB: Jul 10, 1940 DOA: 11/29/2020  PCP: Aretta Nip, MD   Patient coming from:Lives alone. Home> [facility]  Chief Complaint  Patient presents with   Hand Pain     HPI: Kayla Griffin is a 80 y.o. female with medical history significant for diabetes mellitus on insulin, HLD, HTN, arthritis, paroxysmal atrial fibrillation presented to [facility] with left hand/wrist swelling for 3 days. No recent injury, no obvious bite but she had small area of redness in the dorsum of hand ? Bite or injury. Her symptoms have been worsening and also has had, low-grade fever. Patient otherwise denies any nausea, vomiting, chest pain, shortness of breath, fever, chills, headache, focal weakness, numbness tingling, speech difficulties.    ED Course:  Vitals unremarkable except for temperature 100.4 heart rate 94, saturating high percent blood pressure 948N 462V systolic.  Labs showed leukocytosis and lactic acidosis 3.3.  Patient was found to have left hand/wrist swelling felt to be cellulitis versus pseudogout versus gout.  x-ray left wrist/hand showed "diffuse soft tissue edema about the hand and wrist. 3. Mild osteoarthritic pattern arthrosis. 4. Osteopenia" .  Chest x-ray showed pulmonary congestion.  Patient was given 1 L bolus normal saline, vancomycin and ceftriaxone and LR 150 mill per hour and admitted.  Given her diabetes and also streaking up the arm she is being admitted for antibiotics  Assessment/Plan  Sepsis secondary to left wrist/hand swelling and pain- suspect cellulitis: Patient met sepsis criteria with heart rate more than 90, leukocytosis and source likely left hand wrist cellulitis.  There is concern of gout versus pseudogout.  Had leukocytosis and lactic acidosis which is improving with IV fluids.  Continue aggressive IV fluid hydration and repeat lactic acid.  ESR elevated check CRP check procal, trend lactic acid. Follow-up  blood culture.  Continue on current empiric antibiotic with vancomycin/ceftriaxone.   Hypertension: Blood pressure poorly controlled, resume metoprolol ramipril and HCTZ  Insulin-requiring or dependent type II diabetes mellitus : On mix 70/3 units twice daily, keep on 12 units twice daily and add sliding scale.  Update HbA1c.   Hyponatremia: Multifactorial due to hyperglycemia as well as due to #1.  On IV fluids  PAF: Rate controlled.  Resume apixaban and metoprolol  Hld-Lipitor resumed.  Body mass index is 27.98 kg/m.   Severity of Illness: Admit under observation status for IV antibiotics and reassess DVT prophylaxis: SCDs Start: 11/29/20 [id number] Code Status:   Code Status: Full Code  Family Communication: Admission, patients condition and plan of care including tests being ordered have been discussed with the patient who indicate understanding and agree with the plan and Code Status.  Consults called:  None  Review of Systems: All systems were reviewed and were negative except as mentioned in HPI above. Negative for fever Negative for chest pain Negative for shortness of breath  Past Medical History:  Diagnosis Date   Ambulates with cane    Arthritis    Cancer ([place])    Endometrial - hysterectomy   Depression    Diabetes mellitus without complication ([place])    Type II   Dyspnea    currently with arm pain 08/22/19   Glaucoma    HLD (hyperlipidemia)    Hypertension    Wears dentures     Past Surgical History:  Procedure Laterality Date   ABDOMINAL HYSTERECTOMY     CERVICAL FUSION  2007   EYE SURGERY Bilateral    cataracts  I & D EXTREMITY Right 09/15/2019   Procedure: IRRIGATION AND DEBRIDEMENT ELBOW;  Surgeon: Leandrew Koyanagi, MD;  Location: [facility];  Service: Orthopedics;  Laterality: Right;   IRRIGATION AND DEBRIDEMENT ELBOW Right 12/11/2019   Procedure: RIGHT ELBOW IRRIGATION AND DEBRIDEMENT;  Surgeon: Leandrew Koyanagi, MD;  Location: [place];   Service: Orthopedics;  Laterality: Right;   ORIF HUMERUS FRACTURE Right 08/25/2019   Procedure: OPEN REDUCTION INTERNAL FIXATION (ORIF) RIGHT SUPRACONDYLAR HUMERUS FRACTURE, NEUROLYSIS ULNAR NERVE;  Surgeon: Leandrew Koyanagi, MD;  Location: [place];  Service: Orthopedics;  Laterality: Right;   ORIF PATELLA Right 07/07/2016   Procedure: Partial Patellectomy;  Surgeon: Leandrew Koyanagi, MD;  Location: [place];  Service: Orthopedics;  Laterality: Right;     reports that she has never smoked. She has never used smokeless tobacco. She reports that she does not drink alcohol and does not use drugs.  Allergies  Allergen Reactions   Actos [Pioglitazone] Swelling    SWELLING REACTION UNSPECIFIED    Cephalexin Nausea And Vomiting    Family History  Problem Relation Age of Onset   CVA Mother    Heart disease Father 66   Heart attack Sister    Heart disease Sister    Arrhythmia Sister    CVA Sister      Prior to Admission medications   Medication Sig Start Date End Date Taking? Authorizing Provider  alendronate (FOSAMAX) 70 MG tablet Take 70 mg by mouth every Sunday. Take with a full glass of water on an empty stomach.    [provider]  apixaban (ELIQUIS) 5 MG TABS tablet Take 1 tablet (5 mg total) by mouth 2 (two) times daily. 07/07/20   Burnell Blanks, MD  atorvastatin (LIPITOR) 40 MG tablet Take 40 mg by mouth daily.  05/16/16   [provider]  Calcium Carbonate-Vit D-Min (CALCIUM 1200 PO) Take 1,200 mg by mouth daily.    [provider]  collagenase (SANTYL) ointment Apply 1 application topically daily. 12/18/19   Leandrew Koyanagi, MD  dorzolamide (TRUSOPT) 2 % ophthalmic solution Place 1 drop into both eyes 2 (two) times daily.     [provider]  hydrochlorothiazide (HYDRODIURIL) 25 MG tablet Take 25 mg by mouth daily.     [provider]  insulin NPH-regular Human (NOVOLIN 70/30) (70-30) 100 UNIT/ML injection Inject 15 Units into the skin 2 (two)  times daily with a meal.    [provider]  latanoprost (XALATAN) 0.005 % ophthalmic solution Place 1 drop into both eyes at bedtime.  06/15/16   [provider]  metFORMIN (GLUCOPHAGE) 500 MG tablet Take 500 mg by mouth 2 (two) times daily with a meal.    [provider]  methimazole (TAPAZOLE) 10 MG tablet Take 10 mg by mouth daily.    [provider]  metoprolol (LOPRESSOR) 100 MG tablet Take 100 mg by mouth 3 (three) times daily.    [provider]  ramipril (ALTACE) 10 MG capsule Take 20 mg by mouth daily.  05/16/16   [provider]    Physical Exam: Vitals:   11/29/20 1100 11/29/20 1138 11/29/20 1315 11/29/20 1451  BP: (!) 162/89  (!) 183/94 (!) 160/123  Pulse: 70  70 67  Resp: $Remo'16  18 20  'fOFwX$ Temp:  100.2 F (37.9 C)  98.7 F (37.1 C)  TempSrc:  Rectal  Oral  SpO2: 99%  100% 95%  Weight:      Height:      \  General exam: AAOx3, pleasant, elderly,NAD,weak appearing. HEENT:Oral mucosa moist, Ear/Nose WNL grossly, dentition normal. Respiratory system: bilaterally,no wheezing or crackles,no use of accessory muscle Cardiovascular system: S1 & S2 +, No JVD,. Gastrointestinal system: Abdomen soft, NT,ND, BS+ Nervous System:Alert, awake, moving extremities and grossly nonfocal Extremities: No edema, distal peripheral pulses palpable.  Skin: No rashes,no icterus. MSK: Normal muscle bulk,tone, power  Left forearm and hand dorsum swollen erythematous and tender.   Labs on Admission: I have personally reviewed following labs and imaging studies  CBC: Recent Labs  Lab 11/29/20 1009  WBC 12.3*  NEUTROABS 9.4*  HGB 13.3  HCT 40.6  MCV 84.9  PLT 264   Basic Metabolic Panel: Recent Labs  Lab 11/29/20 1009  NA 131*  K 4.2  CL 95*  CO2 24  GLUCOSE 256*  BUN 18  CREATININE 0.75  CALCIUM 9.5   GFR: Estimated Creatinine Clearance: 55.3 mL/min (by C-G formula based on SCr of 0.75 mg/dL). Liver Function Tests: Recent Labs   Lab 11/29/20 1009  AST 25  ALT 20  ALKPHOS 69  BILITOT 1.4*  PROT 7.9  ALBUMIN 3.5   No results for input(s): LIPASE, AMYLASE in the last 168 hours. No results for input(s): AMMONIA in the last 168 hours. Coagulation Profile: Recent Labs  Lab 11/29/20 1023  INR 1.6*   Cardiac Enzymes: No results for input(s): CKTOTAL, CKMB, CKMBINDEX, TROPONINI in the last 168 hours. BNP (last 3 results) No results for input(s): PROBNP in the last 8760 hours. HbA1C: No results for input(s): HGBA1C in the last 72 hours. CBG: Recent Labs  Lab 11/29/20 1328  GLUCAP 157*   Lipid Profile: No results for input(s): CHOL, HDL, LDLCALC, TRIG, CHOLHDL, LDLDIRECT in the last 72 hours. Thyroid Function Tests: No results for input(s): TSH, T4TOTAL, FREET4, T3FREE, THYROIDAB in the last 72 hours. Anemia Panel: No results for input(s): VITAMINB12, FOLATE, FERRITIN, TIBC, IRON, RETICCTPCT in the last 72 hours. Urine analysis:    Component Value Date/Time   COLORURINE YELLOW 11/29/2020 1136   APPEARANCEUR CLOUDY (A) 11/29/2020 1136   LABSPEC 1.010 11/29/2020 1136   PHURINE 6.5 11/29/2020 1136   GLUCOSEU 100 (A) 11/29/2020 1136   HGBUR LARGE (A) 11/29/2020 1136   BILIRUBINUR NEGATIVE 11/29/2020 1136   KETONESUR NEGATIVE 11/29/2020 1136   PROTEINUR NEGATIVE 11/29/2020 1136   NITRITE NEGATIVE 11/29/2020 1136   LEUKOCYTESUR SMALL (A) 11/29/2020 1136    Radiological Exams on Admission: DG Wrist Complete Left  Result Date: 11/29/2020 CLINICAL DATA:  Hand and wrist pain, swelling, and redness, no known injury EXAM: LEFT HAND - COMPLETE 3+ VIEW; LEFT WRIST - COMPLETE 3+ VIEW COMPARISON:  None. FINDINGS: Osteopenia. There is no evidence of acute fracture or dislocation. Probable chronic, callused fracture deformity of the distal left radial metadiaphysis. Generally mild osteoarthritic pattern arthrosis about the hand and wrist. Diffuse soft tissue edema about the hand and wrist. IMPRESSION: 1. No  evidence of acute fracture or dislocation. Probable chronic, callused fracture deformity of the distal left radial metadiaphysis. 2. Diffuse soft tissue edema about the hand and wrist. 3. Mild osteoarthritic pattern arthrosis. 4. Osteopenia. Electronically Signed   By: Eddie Candle M.D.   On: 11/29/2020 11:13   DG Chest Portable 1 View  Result Date: 11/29/2020 CLINICAL DATA:  Fever EXAM: PORTABLE CHEST 1 VIEW COMPARISON:  09/14/2019 FINDINGS: No consolidation. Similar bilateral hilar prominence is likely vascular. Mild pulmonary vascular congestion. Mild cardiomegaly. Probable enlargement of the main pulmonary artery. No significant pleural effusion. No pneumothorax. IMPRESSION:  Mild pulmonary vascular congestion. Mild cardiomegaly and probable enlargement of the main pulmonary artery. Electronically Signed   By: Macy Mis M.D.   On: 11/29/2020 12:40   DG Hand Complete Left  Result Date: 11/29/2020 CLINICAL DATA:  Hand and wrist pain, swelling, and redness, no known injury EXAM: LEFT HAND - COMPLETE 3+ VIEW; LEFT WRIST - COMPLETE 3+ VIEW COMPARISON:  None. FINDINGS: Osteopenia. There is no evidence of acute fracture or dislocation. Probable chronic, callused fracture deformity of the distal left radial metadiaphysis. Generally mild osteoarthritic pattern arthrosis about the hand and wrist. Diffuse soft tissue edema about the hand and wrist. IMPRESSION: 1. No evidence of acute fracture or dislocation. Probable chronic, callused fracture deformity of the distal left radial metadiaphysis. 2. Diffuse soft tissue edema about the hand and wrist. 3. Mild osteoarthritic pattern arthrosis. 4. Osteopenia. Electronically Signed   By: Eddie Candle M.D.   On: 11/29/2020 11:13     Antonieta Pert MD [facility]  If 7PM-7AM, please contact night-coverage [email or web address]  11/29/2020, 3:23 PM

## 2020-11-29 NOTE — Progress Notes (Signed)
CRITICAL VALUE STICKER  CRITICAL VALUE: Lactic Acid 2.4  RECEIVER (on-site recipient of call): Garlon Hatchet, RN  DATE & TIME NOTIFIED: 11/29/2020 at 1618  MESSENGER (representative from lab): Kathlee Nations   MD NOTIFIED: Dr. Lupita Leash  TIME OF NOTIFICATION: 11/29/2020 at 1618  RESPONSE:  pending.

## 2020-11-29 NOTE — ED Notes (Signed)
ED TO INPATIENT HANDOFF REPORT  ED Nurse Name and Phone #: [phone]  S Name/Age/Gender Kayla Griffin 80 y.o. female Room/Bed: MH10/MH10  Code Status   Code Status: Prior  Home/SNF/Other Home Patient oriented to: self, place, time and situation Is this baseline? Yes   Triage Complete: Triage complete  Chief Complaint Cellulitis [id number]  Triage Note Pt c/o left hand pain, redness and swelling starting Saturday. Denies known injury. Temp 148fduring triage    Allergies Allergies  Allergen Reactions  . Actos [Pioglitazone] Swelling    SWELLING REACTION UNSPECIFIED   . Cephalexin Nausea And Vomiting    Level of Care/Admitting Diagnosis ED Disposition    ED Disposition  Admit   Condition  --   Comment  [id number] Level of Care: Med-Surg [16]  Interfacility transfer: Yes  [facility] is available:: Yes  Covid Evaluation: Confirmed COVID Negative  Diagnosis: Cellulitis [[id number] Admitting Physician: HJacqlyn Krauss[[id number] Attending Physician: HJacqlyn Krauss[[id number]        B Medical/Surgery History Past Medical History:  Diagnosis Date  . Ambulates with cane   . Arthritis   . Cancer ([facility]    Endometrial - hysterectomy  . Depression   . Diabetes mellitus without complication ([facility]    Type II  . Dyspnea    currently with arm pain 08/22/19  . Glaucoma   . HLD (hyperlipidemia)   . Hypertension   . Wears dentures    Past Surgical History:  Procedure Laterality Date  . ABDOMINAL HYSTERECTOMY    . CERVICAL FUSION  2007  . EYE SURGERY Bilateral    cataracts  . I & D EXTREMITY Right 09/15/2019   Procedure: IRRIGATION AND DEBRIDEMENT ELBOW;  Surgeon: XLeandrew Koyanagi MD;  [place] [facility]  Service: Orthopedics;  Laterality: Right;  . IRRIGATION AND DEBRIDEMENT ELBOW Right 12/11/2019   Procedure: RIGHT ELBOW IRRIGATION AND  DEBRIDEMENT;  Surgeon: XLeandrew Koyanagi MD;  [place] [facility]  Service: Orthopedics;  Laterality: Right;  . ORIF HUMERUS FRACTURE Right 08/25/2019   Procedure: OPEN REDUCTION INTERNAL FIXATION (ORIF) RIGHT SUPRACONDYLAR HUMERUS FRACTURE, NEUROLYSIS ULNAR NERVE;  Surgeon: XLeandrew Koyanagi MD;  [place] [facility]  Service: Orthopedics;  Laterality: Right;  . ORIF PATELLA Right 07/07/2016   Procedure: Partial Patellectomy;  Surgeon: NLeandrew Koyanagi MD;  [place] [facility]  Service: Orthopedics;  Laterality: Right;     A IV Location/Drains/Wounds Patient Lines/Drains/Airways Status    Active Line/Drains/Airways    Name Placement date Placement time Site Days   Peripheral IV 11/29/20 20 G 1" Anterior;Right Forearm 11/29/20  --  Forearm  less than 1   Incision (Closed) 09/15/19 Arm 09/15/19  1644  -- 441   Incision (Closed) 12/11/19 Arm Right 12/11/19  1610  -- 354   Pressure Injury 09/15/19 Buttocks Left Stage 2 -  Partial thickness loss of dermis presenting as a shallow open injury with a red, pink wound bed without slough. 09/15/19  0100  -- 441          Intake/Output Last 24 hours  Intake/Output Summary (Last 24 hours) at 11/29/2020 1328 Last data filed at 11/29/2020 1307 Gross per 24 hour  Intake 1292.27 ml  Output --  Net 1292.27 ml    Labs/Imaging Results for orders placed or performed during the hospital encounter of 11/29/20 (from the  past 48 hour(s))  CBC with Differential     Status: Abnormal   Collection Time: 11/29/20 10:09 AM  Result Value Ref Range   WBC 12.3 (H) 4.0 - 10.5 K/uL   RBC 4.78 3.87 - 5.11 MIL/uL   Hemoglobin 13.3 12.0 - 15.0 g/dL   HCT 40.6 36.0 - 46.0 %   MCV 84.9 80.0 - 100.0 fL   MCH 27.8 26.0 - 34.0 pg   MCHC 32.8 30.0 - 36.0 g/dL   RDW 18.0 (H) 11.5 - 15.5 %   Platelets 232 150 - 400 K/uL   nRBC 0.0 0.0 - 0.2 %   Neutrophils Relative % 77 %   Neutro Abs 9.4 (H) 1.7 - 7.7 K/uL   Lymphocytes Relative 13 %   Lymphs Abs 1.6 0.7 - 4.0 K/uL    Monocytes Relative 9 %   Monocytes Absolute 1.1 (H) 0.1 - 1.0 K/uL   Eosinophils Relative 1 %   Eosinophils Absolute 0.1 0.0 - 0.5 K/uL   Basophils Relative 0 %   Basophils Absolute 0.0 0.0 - 0.1 K/uL   Immature Granulocytes 0 %   Abs Immature Granulocytes 0.05 0.00 - 0.07 K/uL    Comment: Performed at [facility], [address] [place], [place] [address]  Comprehensive metabolic panel     Status: Abnormal   Collection Time: 11/29/20 10:09 AM  Result Value Ref Range   Sodium 131 (L) 135 - 145 mmol/L   Potassium 4.2 3.5 - 5.1 mmol/L   Chloride 95 (L) 98 - 111 mmol/L   CO2 24 22 - 32 mmol/L   Glucose, Bld 256 (H) 70 - 99 mg/dL    Comment: Glucose reference range applies only to samples taken after fasting for at least 8 hours.   BUN 18 8 - 23 mg/dL   Creatinine, Ser 0.75 0.44 - 1.00 mg/dL   Calcium 9.5 8.9 - 10.3 mg/dL   Total Protein 7.9 6.5 - 8.1 g/dL   Albumin 3.5 3.5 - 5.0 g/dL   AST 25 15 - 41 U/L   ALT 20 0 - 44 U/L   Alkaline Phosphatase 69 38 - 126 U/L   Total Bilirubin 1.4 (H) 0.3 - 1.2 mg/dL   GFR, Estimated >60 >60 mL/min    Comment: (NOTE) Calculated using the CKD-EPI Creatinine Equation (2021)    Anion gap 12 5 - 15    Comment: Performed at [facility], [address] [place], [place] [address]  Resp Panel by RT-PCR (Flu A&B, Covid) Nasopharyngeal Swab     Status: None   Collection Time: 11/29/20 10:23 AM   Specimen: Nasopharyngeal Swab; Nasopharyngeal(NP) swabs in vial transport medium  Result Value Ref Range   SARS Coronavirus 2 by RT PCR NEGATIVE NEGATIVE    Comment: (NOTE) SARS-CoV-2 target nucleic acids are NOT DETECTED.  The SARS-CoV-2 RNA is generally detectable in upper respiratory specimens during the acute phase of infection. The lowest concentration of SARS-CoV-2 viral copies this assay can detect is 138 copies/mL. A negative result does not preclude SARS-Cov-2 infection and should not be used as the sole basis for  treatment or other patient management decisions. A negative result may occur with  improper specimen collection/handling, submission of specimen other than nasopharyngeal swab, presence of viral mutation(s) within the areas targeted by this assay, and inadequate number of viral copies(<138 copies/mL). A negative result must be combined with clinical observations, patient history, and epidemiological information. The expected result is Negative.  Fact Sheet for Patients:  EntrepreneurPulse.com.au  Fact Sheet for Healthcare Providers:  IncredibleEmployment.be  This test is no t yet approved or cleared by the [place] FDA and  has been authorized for detection and/or diagnosis of SARS-CoV-2 by FDA under an Emergency Use Authorization (EUA). This EUA will remain  in effect (meaning this test can be used) for the duration of the COVID-19 declaration under Section 564(b)(1) of the Act, 21 U.S.C.section 360bbb-3(b)(1), unless the authorization is terminated  or revoked sooner.       Influenza A by PCR NEGATIVE NEGATIVE   Influenza B by PCR NEGATIVE NEGATIVE    Comment: (NOTE) The Xpert Xpress SARS-CoV-2/FLU/RSV plus assay is intended as an aid in the diagnosis of influenza from Nasopharyngeal swab specimens and should not be used as a sole basis for treatment. Nasal washings and aspirates are unacceptable for Xpert Xpress SARS-CoV-2/FLU/RSV testing.  Fact Sheet for Patients: EntrepreneurPulse.com.au  Fact Sheet for Healthcare Providers: IncredibleEmployment.be  This test is not yet approved or cleared by the [place] FDA and has been authorized for detection and/or diagnosis of SARS-CoV-2 by FDA under an Emergency Use Authorization (EUA). This EUA will remain in effect (meaning this test can be used) for the duration of the COVID-19 declaration under Section 564(b)(1) of the Act, 21 U.S.C. section  360bbb-3(b)(1), unless the authorization is terminated or revoked.  Performed at [facility], [address] [place], [place] [address]   Lactic acid, plasma     Status: Abnormal   Collection Time: 11/29/20 10:23 AM  Result Value Ref Range   Lactic Acid, Venous 3.3 (HH) 0.5 - 1.9 mmol/L    Comment: CRITICAL RESULT CALLED TO, READ BACK BY AND VERIFIED WITH: SAM COBLE,RN AT 1054 11/29/20 BY K BARR Performed at [facility], [address] [place], [place] [address]   Protime-INR     Status: Abnormal   Collection Time: 11/29/20 10:23 AM  Result Value Ref Range   Prothrombin Time 19.2 (H) 11.4 - 15.2 seconds   INR 1.6 (H) 0.8 - 1.2    Comment: (NOTE) INR goal varies based on device and disease states. Performed at [facility], [address] [place], [place] [address]   APTT     Status: Abnormal   Collection Time: 11/29/20 10:23 AM  Result Value Ref Range   aPTT 40 (H) 24 - 36 seconds    Comment:        IF BASELINE aPTT IS ELEVATED, SUGGEST PATIENT RISK ASSESSMENT BE USED TO DETERMINE APPROPRIATE ANTICOAGULANT THERAPY. Performed at [facility], [address] [place], [place] [address]   Urinalysis, Routine w reflex microscopic Urine, Clean Catch     Status: Abnormal   Collection Time: 11/29/20 11:36 AM  Result Value Ref Range   Color, Urine YELLOW YELLOW   APPearance CLOUDY (A) CLEAR   Specific Gravity, Urine 1.010 1.005 - 1.030   pH 6.5 5.0 - 8.0   Glucose, UA 100 (A) NEGATIVE mg/dL   Hgb urine dipstick LARGE (A) NEGATIVE   Bilirubin Urine NEGATIVE NEGATIVE   Ketones, ur NEGATIVE NEGATIVE mg/dL   Protein, ur NEGATIVE NEGATIVE mg/dL   Nitrite NEGATIVE NEGATIVE   Leukocytes,Ua SMALL (A) NEGATIVE    Comment: Performed at [facility], [address] [place], [place] [address]  Urinalysis, Microscopic (reflex)     Status: Abnormal   Collection Time: 11/29/20 11:36 AM  Result Value Ref  Range   RBC / HPF >50 0  - 5 RBC/hpf   WBC, UA 0-5 0 - 5 WBC/hpf   Bacteria, UA RARE (A) NONE SEEN   Squamous Epithelial / LPF 0-5 0 - 5    Comment: Performed at [facility], [address] [place], [place] [address]   DG Wrist Complete Left  Result Date: 11/29/2020 CLINICAL DATA:  Hand and wrist pain, swelling, and redness, no known injury EXAM: LEFT HAND - COMPLETE 3+ VIEW; LEFT WRIST - COMPLETE 3+ VIEW COMPARISON:  None. FINDINGS: Osteopenia. There is no evidence of acute fracture or dislocation. Probable chronic, callused fracture deformity of the distal left radial metadiaphysis. Generally mild osteoarthritic pattern arthrosis about the hand and wrist. Diffuse soft tissue edema about the hand and wrist. IMPRESSION: 1. No evidence of acute fracture or dislocation. Probable chronic, callused fracture deformity of the distal left radial metadiaphysis. 2. Diffuse soft tissue edema about the hand and wrist. 3. Mild osteoarthritic pattern arthrosis. 4. Osteopenia. Electronically Signed   By: Eddie Candle M.D.   On: 11/29/2020 11:13   DG Chest Portable 1 View  Result Date: 11/29/2020 CLINICAL DATA:  Fever EXAM: PORTABLE CHEST 1 VIEW COMPARISON:  09/14/2019 FINDINGS: No consolidation. Similar bilateral hilar prominence is likely vascular. Mild pulmonary vascular congestion. Mild cardiomegaly. Probable enlargement of the main pulmonary artery. No significant pleural effusion. No pneumothorax. IMPRESSION: Mild pulmonary vascular congestion. Mild cardiomegaly and probable enlargement of the main pulmonary artery. Electronically Signed   By: Macy Mis M.D.   On: 11/29/2020 12:40   DG Hand Complete Left  Result Date: 11/29/2020 CLINICAL DATA:  Hand and wrist pain, swelling, and redness, no known injury EXAM: LEFT HAND - COMPLETE 3+ VIEW; LEFT WRIST - COMPLETE 3+ VIEW COMPARISON:  None. FINDINGS: Osteopenia. There is no evidence of acute fracture or dislocation. Probable chronic, callused fracture deformity of the  distal left radial metadiaphysis. Generally mild osteoarthritic pattern arthrosis about the hand and wrist. Diffuse soft tissue edema about the hand and wrist. IMPRESSION: 1. No evidence of acute fracture or dislocation. Probable chronic, callused fracture deformity of the distal left radial metadiaphysis. 2. Diffuse soft tissue edema about the hand and wrist. 3. Mild osteoarthritic pattern arthrosis. 4. Osteopenia. Electronically Signed   By: Eddie Candle M.D.   On: 11/29/2020 11:13    Pending Labs Unresulted Labs (From admission, onward)    Start     Ordered   11/29/20 1200  Uric acid  Once,   STAT        11/29/20 1200   11/29/20 1030  Sedimentation rate  ONCE - STAT,   STAT        11/29/20 1029   11/29/20 1030  C-reactive protein  Once,   STAT        11/29/20 1029   11/29/20 1018  Lactic acid, plasma  (Septic presentation on arrival (screening labs, nursing and treatment orders for obvious sepsis))  Now then every 2 hours,   STAT      11/29/20 1019   11/29/20 1018  Blood Culture (routine x 2)  (Septic presentation on arrival (screening labs, nursing and treatment orders for obvious sepsis))  BLOOD CULTURE X 2,   STAT      11/29/20 1019          Vitals/Pain Today's Vitals   11/29/20 0947 11/29/20 1100 11/29/20 1138 11/29/20 1315  BP: (!) 165/89 (!) 162/89  (!) 183/94  Pulse: 95 70  70  Resp: (!) '22 16  18  '$ Temp: 100 F (37.8 C)  100.2 F (37.9 C)   TempSrc: Oral  Rectal   SpO2: 95% 99%  100%  Weight:      Height:      PainSc:        Isolation Precautions No active isolations  Medications Medications  lactated ringers infusion ( Intravenous New Bag/Given 11/29/20 1047)  cefTRIAXone (ROCEPHIN) 1 g in sodium chloride 0.9 % 100 mL IVPB (0 g Intravenous Stopped 11/29/20 1135)  vancomycin (VANCOREADY) IVPB 750 mg/150 mL (has no administration in time range)  vancomycin (VANCOCIN) IVPB 1000 mg/200 mL premix (0 mg Intravenous Stopped 11/29/20 1242)  sodium chloride 0.9 % bolus  1,000 mL (0 mLs Intravenous Stopped 11/29/20 1307)    Mobility walks with device Low fall risk   Focused Assessments Cardiac Assessment Handoff:    No results found for: CKTOTAL, CKMB, CKMBINDEX, TROPONINI No results found for: DDIMER Does the Patient currently have chest pain? No      R Recommendations: See Admitting Provider Note  Report given to:   Additional Notes:

## 2020-11-29 NOTE — Plan of Care (Signed)
  Problem: Clinical Measurements: Goal: Diagnostic test results will improve Outcome: Progressing   

## 2020-11-30 DIAGNOSIS — E871 Hypo-osmolality and hyponatremia: Secondary | ICD-10-CM | POA: Diagnosis not present

## 2020-11-30 DIAGNOSIS — Z7983 Long term (current) use of bisphosphonates: Secondary | ICD-10-CM | POA: Diagnosis not present

## 2020-11-30 DIAGNOSIS — H409 Unspecified glaucoma: Secondary | ICD-10-CM | POA: Diagnosis not present

## 2020-11-30 DIAGNOSIS — E872 Acidosis: Secondary | ICD-10-CM | POA: Diagnosis not present

## 2020-11-30 DIAGNOSIS — M8580 Other specified disorders of bone density and structure, unspecified site: Secondary | ICD-10-CM | POA: Diagnosis not present

## 2020-11-30 DIAGNOSIS — Z20822 Contact with and (suspected) exposure to covid-19: Secondary | ICD-10-CM | POA: Diagnosis not present

## 2020-11-30 DIAGNOSIS — Z794 Long term (current) use of insulin: Secondary | ICD-10-CM | POA: Diagnosis not present

## 2020-11-30 DIAGNOSIS — R000 Tachycardia, unspecified: Secondary | ICD-10-CM | POA: Diagnosis not present

## 2020-11-30 DIAGNOSIS — I10 Essential (primary) hypertension: Secondary | ICD-10-CM | POA: Diagnosis not present

## 2020-11-30 DIAGNOSIS — M1990 Unspecified osteoarthritis, unspecified site: Secondary | ICD-10-CM | POA: Diagnosis not present

## 2020-11-30 DIAGNOSIS — Z981 Arthrodesis status: Secondary | ICD-10-CM | POA: Diagnosis not present

## 2020-11-30 DIAGNOSIS — E1165 Type 2 diabetes mellitus with hyperglycemia: Secondary | ICD-10-CM | POA: Diagnosis not present

## 2020-11-30 DIAGNOSIS — I480 Paroxysmal atrial fibrillation: Secondary | ICD-10-CM | POA: Diagnosis not present

## 2020-11-30 DIAGNOSIS — E0590 Thyrotoxicosis, unspecified without thyrotoxic crisis or storm: Secondary | ICD-10-CM | POA: Diagnosis not present

## 2020-11-30 DIAGNOSIS — A419 Sepsis, unspecified organism: Secondary | ICD-10-CM | POA: Diagnosis not present

## 2020-11-30 DIAGNOSIS — Z823 Family history of stroke: Secondary | ICD-10-CM | POA: Diagnosis not present

## 2020-11-30 DIAGNOSIS — E785 Hyperlipidemia, unspecified: Secondary | ICD-10-CM | POA: Diagnosis not present

## 2020-11-30 DIAGNOSIS — Z79899 Other long term (current) drug therapy: Secondary | ICD-10-CM | POA: Diagnosis not present

## 2020-11-30 DIAGNOSIS — Z8249 Family history of ischemic heart disease and other diseases of the circulatory system: Secondary | ICD-10-CM | POA: Diagnosis not present

## 2020-11-30 DIAGNOSIS — Z7901 Long term (current) use of anticoagulants: Secondary | ICD-10-CM | POA: Diagnosis not present

## 2020-11-30 DIAGNOSIS — Z888 Allergy status to other drugs, medicaments and biological substances status: Secondary | ICD-10-CM | POA: Diagnosis not present

## 2020-11-30 DIAGNOSIS — L03114 Cellulitis of left upper limb: Secondary | ICD-10-CM | POA: Diagnosis not present

## 2020-11-30 LAB — URINALYSIS, ROUTINE W REFLEX MICROSCOPIC
Bilirubin Urine: NEGATIVE
Glucose, UA: NEGATIVE mg/dL
Ketones, ur: 20 mg/dL — AB
Nitrite: NEGATIVE
Protein, ur: 30 mg/dL — AB
RBC / HPF: >50 RBC/hpf — ABNORMAL HIGH (ref 0–5)
Specific Gravity, Urine: 1.012 (ref 1.005–1.030)
pH: 7 (ref 5.0–8.0)

## 2020-11-30 LAB — CBC
HCT: 36 % (ref 36.0–46.0)
Hemoglobin: 11.8 g/dL — ABNORMAL LOW (ref 12.0–15.0)
MCH: 28 pg (ref 26.0–34.0)
MCHC: 32.8 g/dL (ref 30.0–36.0)
MCV: 85.5 fL (ref 80.0–100.0)
Platelets: 220 10*3/uL (ref 150–400)
RBC: 4.21 MIL/uL (ref 3.87–5.11)
RDW: 18.2 % — ABNORMAL HIGH (ref 11.5–15.5)
WBC: 11.7 10*3/uL — ABNORMAL HIGH (ref 4.0–10.5)
nRBC: 0 % (ref 0.0–0.2)

## 2020-11-30 LAB — PROCALCITONIN: Procalcitonin: <0.1 ng/mL

## 2020-11-30 LAB — BASIC METABOLIC PANEL
Anion gap: 11 (ref 5–15)
BUN: 13 mg/dL (ref 8–23)
CO2: 21 mmol/L — ABNORMAL LOW (ref 22–32)
Calcium: 8.5 mg/dL — ABNORMAL LOW (ref 8.9–10.3)
Chloride: 96 mmol/L — ABNORMAL LOW (ref 98–111)
Creatinine, Ser: 0.66 mg/dL (ref 0.44–1.00)
GFR, Estimated: >60 mL/min (ref >60)
Glucose, Bld: 272 mg/dL — ABNORMAL HIGH (ref 70–99)
Potassium: 3.5 mmol/L (ref 3.5–5.1)
Sodium: 128 mmol/L — ABNORMAL LOW (ref 135–145)

## 2020-11-30 LAB — GLUCOSE, CAPILLARY
Glucose-Capillary: 171 mg/dL — ABNORMAL HIGH (ref 70–99)
Glucose-Capillary: 164 mg/dL — ABNORMAL HIGH (ref 70–99)
Glucose-Capillary: 123 mg/dL — ABNORMAL HIGH (ref 70–99)
Glucose-Capillary: 80 mg/dL (ref 70–99)

## 2020-11-30 LAB — HEMOGLOBIN A1C
Hgb A1c MFr Bld: 6.7 % — ABNORMAL HIGH (ref 4.8–5.6)
Mean Plasma Glucose: 145.59 mg/dL

## 2020-11-30 MED ORDER — INSULIN ASPART PROT & ASPART (70-30 MIX) 100 UNIT/ML ~~LOC~~ SUSP
15.0000 [IU] | Freq: Two times a day (BID) | SUBCUTANEOUS | Status: DC
  Administered 2020-11-30 – 2020-12-02 (×4): 15 [IU] via SUBCUTANEOUS
  Filled 2020-11-30: qty 10

## 2020-11-30 MED ORDER — VANCOMYCIN HCL IN DEXTROSE 1-5 GM/200ML-% IV SOLN: 1000.0000 mg | INTRAVENOUS | Status: DC

## 2020-11-30 MED ORDER — CEFAZOLIN SODIUM-DEXTROSE 1-4 GM/50ML-% IV SOLN
1.0000 g | Freq: Three times a day (TID) | INTRAVENOUS | Status: DC
  Administered 2020-12-01 – 2020-12-02 (×4): 1 g via INTRAVENOUS
  Filled 2020-11-30 (×5): qty 50

## 2020-11-30 NOTE — Progress Notes (Signed)
PROGRESS NOTE    Kayla Griffin  HCW:[id number] DOB: May 30, 1940 DOA: 11/29/2020 PCP: Aretta Nip, MD   Chief Complaint  Patient presents with   Hand Pain   Brief Narrative:  80 y.o. female with medical history significant for diabetes mellitus on insulin, HLD, HTN, arthritis, paroxysmal atrial fibrillation presented to [facility] with left hand/wrist swelling for 3 days. No recent injury, no obvious bite but she had small area of redness in the dorsum of hand ? Bite or injury. Her symptoms have been worsening and also has had, low-grade fever. Patient otherwise denies any nausea, vomiting, chest pain, shortness of breath, fever, chills, headache, focal weakness, numbness tingling, speech difficulties.     ED Course:  Vitals unremarkable except for temperature 100.4 heart rate 94, saturating high percent blood pressure 176H 607P systolic.  Labs showed leukocytosis and lactic acidosis 3.3.  Patient was found to have left hand/wrist swelling felt to be cellulitis versus pseudogout versus gout.  x-ray left wrist/hand showed "diffuse soft tissue edema about the hand and wrist. 3. Mild osteoarthritic pattern arthrosis. 4. Osteopenia" .  Chest x-ray showed pulmonary congestion.  Patient was given 1 L bolus normal saline, vancomycin and ceftriaxone and LR 150 mill per hour and admitted.  Given her diabetes and also streaking up the arm she is being admitted for antibiotics   Subjective: Able to flex her fingers some and ;ain and redness improving Afebrile She is worried about her to $250 per day of co-pay and wants to go home but I mentioned to her that she is already stable for discharge  Assessment & Plan:  Sepsis secondary to left wrist/hand swelling and pain- suspect cellulitis: Patient met sepsis criteria with heart rate more than 90, leukocytosis  and lactic acidosis- from left hand/wrist cellulitis.  uric acid norma,l esr crp elevated. Cont vanco, rocephin, lactic acidosis,  leucocytosis is better s/p iv fluid bloluses 1 liter on admission. Blood cx Pending. Recent Labs  Lab 11/29/20 1009 11/29/20 1023 11/29/20 1304 11/29/20 1545 11/29/20 1701 11/30/20 0910  WBC 12.3*  --   --   --   --  11.7*  LATICACIDVEN  --  3.3* 2.6* 2.4* 1.6  --   PROCALCITON  --   --   --  <0.10  --   --      Hypertension: Blood pressure borderline controlled cont metoprolol ramipril prn hydralazine, hold  HCTZ 2/2 hyponatremia   Insulin-requiring or dependent type II diabetes mellitus : On mix 70/30  15 u BID- increase to hoem dose, cont ssi.hba1c pending. Sugar uncontrolled. Will need tight control due to sepsis Recent Labs  Lab 11/29/20 1328 11/29/20 1712 11/29/20 2228 11/30/20 0831  GLUCAP 157* 72 123* 171*      Hyponatremia: Multifactorial due to hyperglycemia as well as due to #1 adb hctz- stop hctz. Stop ivf. Bmp in am. Recent Labs  Lab 11/29/20 1009 11/30/20 0910  NA 131* 128*     PAF: Rate controlled.  Resume apixaban and metoprolol  Hyperthyroidism cont her methimazole  Hld-Lipitor resumed.   Body mass index is 27.98 kg/m  Diet Order             Diet Carb Modified Fluid consistency: Thin; Room service appropriate? Yes  Diet effective now                  Patient's Body mass index is 27.98 kg/m. DVT prophylaxis: SCDs Start: 11/29/20 1518 eliquis Code Status:   Code Status:  Full Code  Family Communication: plan of care discussed with patient at bedside. Status is: admitted as observation  The patient will require care spanning > 2 midnights and should be moved to inpatient because: IV treatments appropriate due to intensity of illness or inability to take PO, Inpatient level of care appropriate due to severity of illness, and for ongoing management w/ iv antibiotics for celulitis with sepsis   Dispo: The patient is from: Home              Anticipated d/c is to: Home              Patient currently is not medically stable to d/c.   Difficult to  place patient No Unresulted Labs (From admission, onward)     Start     Ordered   12/01/20 0500  Creatinine, serum  Tomorrow morning,   R        11/30/20 0957   11/30/20 0500  Procalcitonin  Daily,   R      11/29/20 1519   11/30/20 0500  Hemoglobin A1c  Tomorrow morning,   R       Comments: To assess prior glycemic control    11/29/20 1526            Medications reviewed:  Scheduled Meds:  apixaban  5 mg Oral BID   atorvastatin  40 mg Oral Daily   dorzolamide  1 drop Both Eyes BID   insulin aspart  0-5 Units Subcutaneous QHS   insulin aspart  0-9 Units Subcutaneous TID WC   insulin aspart protamine- aspart  12 Units Subcutaneous BID WC   latanoprost  1 drop Both Eyes QHS   methimazole  10 mg Oral Daily   metoprolol tartrate  100 mg Oral BID   ramipril  20 mg Oral Daily   Continuous Infusions:  cefTRIAXone (ROCEPHIN)  IV Stopped (11/29/20 1135)   Consultants:see note  Procedures:see note Antimicrobials: Anti-infectives (From admission, onward)    Start     Dose/Rate Route Frequency Ordered Stop   11/29/20 2200  vancomycin (VANCOREADY) IVPB 750 mg/150 mL  Status:  Discontinued        750 mg 150 mL/hr over 60 Minutes Intravenous Every 12 hours 11/29/20 1110 11/30/20 1001   11/29/20 1045  cefTRIAXone (ROCEPHIN) 1 g in sodium chloride 0.9 % 100 mL IVPB        1 g 200 mL/hr over 30 Minutes Intravenous Every 24 hours 11/29/20 1030     11/29/20 1030  vancomycin (VANCOCIN) IVPB 1000 mg/200 mL premix        1,000 mg 200 mL/hr over 60 Minutes Intravenous  Once 11/29/20 1019 11/29/20 1242   11/29/20 1030  levofloxacin (LEVAQUIN) IVPB 750 mg  Status:  Discontinued        750 mg 100 mL/hr over 90 Minutes Intravenous  Once 11/29/20 1019 11/29/20 1030      Culture/Microbiology    Component Value Date/Time   SDES  11/29/2020 1100    BLOOD RIGHT HAND Performed at [facility], [address] [place], [place] [address]    [facility]  11/29/2020 1100    BOTTLES  DRAWN AEROBIC AND ANAEROBIC Blood Culture adequate volume Performed at [facility], [address] [place], [place] [address]    CULT  11/29/2020 1100    NO GROWTH < 24 HOURS Performed at [facility], [place] [address] [place],  [address]    REPTSTATUS PENDING  11/29/2020 1100    Other culture-see note  Objective: Vitals: Today's Vitals   11/29/20 2245 11/30/20 0333 11/30/20 0514 11/30/20 0828  BP: (!) 145/78 (!) 147/79 (!) 157/80 (!) 156/91  Pulse: 81 70 83 88  Resp: 20 (!) _0 Temp: 98.8 F (37.1 C) 98.5 F (36.9 C) 98.9 F (37.2 C) 98.4 F (36.9 C)  TempSrc: Oral Oral Oral Oral  SpO2: 96% 96% 98% 99%  Weight:      Height:      PainSc:        Intake/Output Summary (Last 24 hours) at 11/30/2020 1005 Last data filed at 11/29/2020 1903 Gross per 24 hour  Intake 2805.63 ml  Output --  Net 2805.63 ml   Filed Weights   11/29/20 0944  Weight: 73.9 kg   Weight change:   Intake/Output from previous day: 08/22 0701 - 08/23 0700 In: 2805.6 [P.O.:472; I.V.:999.5; IV Piggyback:1334.2] Out: -  Intake/Output this shift: No intake/output data recorded. Filed Weights   11/29/20 0944  Weight: 73.9 kg   Examination: General exam: AAO x3, pleasant. HEENT:Oral mucosa moist, Ear/Nose WNL grossly,dentition normal. Respiratory system: bilaterally diminished,no use of accessory muscle, non tender. Cardiovascular system: S1 & S2 +,No JVD. Gastrointestinal system: Abdomen soft, NT,ND, BS+. Nervous System:Alert, awake, moving extremities Extremities: LUE edematous hand wrist and erythematous, tender, able to move finger some and overall improving see pic  edema, distal peripheral pulses palpable.  Skin: No rashes,no icterus. MSK: Normal muscle bulk,tone, power    Data Reviewed: I have personally reviewed following labs and imaging studies CBC: Recent Labs  Lab 11/29/20 1009 11/30/20 0910  WBC 12.3* 11.7*  NEUTROABS 9.4*  --   HGB 13.3 11.8*   HCT 40.6 36.0  MCV 84.9 85.5  PLT 232 712   Basic Metabolic Panel: Recent Labs  Lab 11/29/20 1009 11/30/20 0910  NA 131* 128*  K 4.2 3.5  CL 95* 96*  CO2 24 21*  GLUCOSE 256* 272*  BUN 18 13  CREATININE 0.75 0.66  CALCIUM 9.5 8.5*   GFR: Estimated Creatinine Clearance: 55.3 mL/min (by C-G formula based on SCr of 0.66 mg/dL). Liver Function Tests: Recent Labs  Lab 11/29/20 1009  AST 25  ALT 20  ALKPHOS 69  BILITOT 1.4*  PROT 7.9  ALBUMIN 3.5   No results for input(s): LIPASE, AMYLASE in the last 168 hours. No results for input(s): AMMONIA in the last 168 hours. Coagulation Profile: Recent Labs  Lab 11/29/20 1023  INR 1.6*   Cardiac Enzymes: No results for input(s): CKTOTAL, CKMB, CKMBINDEX, TROPONINI in the last 168 hours. BNP (last 3 results) No results for input(s): PROBNP in the last 8760 hours. HbA1C: No results for input(s): HGBA1C in the last 72 hours. CBG: Recent Labs  Lab 11/29/20 1328 11/29/20 1712 11/29/20 2228 11/30/20 0831  GLUCAP 157* 72 123* 171*   Lipid Profile: No results for input(s): CHOL, HDL, LDLCALC, TRIG, CHOLHDL, LDLDIRECT in the last 72 hours. Thyroid Function Tests: No results for input(s): TSH, T4TOTAL, FREET4, T3FREE, THYROIDAB in the last 72 hours. Anemia Panel: No results for input(s): VITAMINB12, FOLATE, FERRITIN, TIBC, IRON, RETICCTPCT in the last 72 hours. Sepsis Labs: Recent Labs  Lab 11/29/20 1023 11/29/20 1304 11/29/20 1545 11/29/20 1701  PROCALCITON  --   --  <0.10  --   LATICACIDVEN 3.3* 2.6* 2.4* 1.6    Recent Results (from the past 240 hour(s))  Resp Panel by RT-PCR (Flu A&B, Covid) Nasopharyngeal Swab     Status:  None   Collection Time: 11/29/20 10:23 AM   Specimen: Nasopharyngeal Swab; Nasopharyngeal(NP) swabs in vial transport medium  Result Value Ref Range Status   SARS Coronavirus 2 by RT PCR NEGATIVE NEGATIVE Final    Comment: (NOTE) SARS-CoV-2 target nucleic acids are NOT DETECTED.  The  SARS-CoV-2 RNA is generally detectable in upper respiratory specimens during the acute phase of infection. The lowest concentration of SARS-CoV-2 viral copies this assay can detect is 138 copies/mL. A negative result does not preclude SARS-Cov-2 infection and should not be used as the sole basis for treatment or other patient management decisions. A negative result may occur with  improper specimen collection/handling, submission of specimen other than nasopharyngeal swab, presence of viral mutation(s) within the areas targeted by this assay, and inadequate number of viral copies(<138 copies/mL). A negative result must be combined with clinical observations, patient history, and epidemiological information. The expected result is Negative.  Fact Sheet for Patients:  EntrepreneurPulse.com.au  Fact Sheet for Healthcare Providers:  IncredibleEmployment.be  This test is no t yet approved or cleared by the [place] FDA and  has been authorized for detection and/or diagnosis of SARS-CoV-2 by FDA under an Emergency Use Authorization (EUA). This EUA will remain  in effect (meaning this test can be used) for the duration of the COVID-19 declaration under Section 564(b)(1) of the Act, 21 U.S.C.section 360bbb-3(b)(1), unless the authorization is terminated  or revoked sooner.       Influenza A by PCR NEGATIVE NEGATIVE Final   Influenza B by PCR NEGATIVE NEGATIVE Final    Comment: (NOTE) The Xpert Xpress SARS-CoV-2/FLU/RSV plus assay is intended as an aid in the diagnosis of influenza from Nasopharyngeal swab specimens and should not be used as a sole basis for treatment. Nasal washings and aspirates are unacceptable for Xpert Xpress SARS-CoV-2/FLU/RSV testing.  Fact Sheet for Patients: EntrepreneurPulse.com.au  Fact Sheet for Healthcare Providers: IncredibleEmployment.be  This test is not yet approved or  cleared by the [place] FDA and has been authorized for detection and/or diagnosis of SARS-CoV-2 by FDA under an Emergency Use Authorization (EUA). This EUA will remain in effect (meaning this test can be used) for the duration of the COVID-19 declaration under Section 564(b)(1) of the Act, 21 U.S.C. section 360bbb-3(b)(1), unless the authorization is terminated or revoked.  Performed at [facility], [address] [place], [place] [address]   Blood Culture (routine x 2)     Status: None (Preliminary result)   Collection Time: 11/29/20 10:29 AM   Specimen: BLOOD  Result Value Ref Range Status   Specimen Description   Final    BLOOD RIGHT ARM Performed at [facility], [address] [place], [place] [address]    Special Requests   Final    BOTTLES DRAWN AEROBIC AND ANAEROBIC Blood Culture adequate volume Performed at [facility], [address] [place], [place] [address]    Culture   Final    NO GROWTH < 24 HOURS Performed at [facility], [place] [address], [place], [place] [address]    Report Status PENDING  Incomplete  Blood Culture (routine x 2)     Status: None (Preliminary result)   Collection Time: 11/29/20 11:00 AM   Specimen: BLOOD  Result Value Ref Range Status   Specimen Description   Final    BLOOD RIGHT HAND Performed at [facility], [address], [place], [place] [address]  Special Requests   Final    BOTTLES DRAWN AEROBIC AND ANAEROBIC Blood Culture adequate volume Performed at [facility], [address] [place], [place] [address]    Culture   Final    NO GROWTH < 24 HOURS Performed at [facility], [place] [address], [place], [place] [address]    Report Status PENDING  Incomplete     Radiology Studies: DG Wrist Complete Left  Result Date: 11/29/2020 CLINICAL DATA:  Hand and wrist pain, swelling, and redness, no known injury EXAM: LEFT HAND - COMPLETE 3+ VIEW; LEFT WRIST -  COMPLETE 3+ VIEW COMPARISON:  None. FINDINGS: Osteopenia. There is no evidence of acute fracture or dislocation. Probable chronic, callused fracture deformity of the distal left radial metadiaphysis. Generally mild osteoarthritic pattern arthrosis about the hand and wrist. Diffuse soft tissue edema about the hand and wrist. IMPRESSION: 1. No evidence of acute fracture or dislocation. Probable chronic, callused fracture deformity of the distal left radial metadiaphysis. 2. Diffuse soft tissue edema about the hand and wrist. 3. Mild osteoarthritic pattern arthrosis. 4. Osteopenia. Electronically Signed   By: Eddie Candle M.D.   On: 11/29/2020 11:13   DG Chest Portable 1 View  Result Date: 11/29/2020 CLINICAL DATA:  Fever EXAM: PORTABLE CHEST 1 VIEW COMPARISON:  09/14/2019 FINDINGS: No consolidation. Similar bilateral hilar prominence is likely vascular. Mild pulmonary vascular congestion. Mild cardiomegaly. Probable enlargement of the main pulmonary artery. No significant pleural effusion. No pneumothorax. IMPRESSION: Mild pulmonary vascular congestion. Mild cardiomegaly and probable enlargement of the main pulmonary artery. Electronically Signed   By: Macy Mis M.D.   On: 11/29/2020 12:40   DG Hand Complete Left  Result Date: 11/29/2020 CLINICAL DATA:  Hand and wrist pain, swelling, and redness, no known injury EXAM: LEFT HAND - COMPLETE 3+ VIEW; LEFT WRIST - COMPLETE 3+ VIEW COMPARISON:  None. FINDINGS: Osteopenia. There is no evidence of acute fracture or dislocation. Probable chronic, callused fracture deformity of the distal left radial metadiaphysis. Generally mild osteoarthritic pattern arthrosis about the hand and wrist. Diffuse soft tissue edema about the hand and wrist. IMPRESSION: 1. No evidence of acute fracture or dislocation. Probable chronic, callused fracture deformity of the distal left radial metadiaphysis. 2. Diffuse soft tissue edema about the hand and wrist. 3. Mild osteoarthritic  pattern arthrosis. 4. Osteopenia. Electronically Signed   By: Eddie Candle M.D.   On: 11/29/2020 11:13     LOS: 0 days   Antonieta Pert, MD [facility]  11/30/2020, 10:05 AM

## 2020-11-30 NOTE — Plan of Care (Signed)
  Problem: Health Behavior/Discharge Planning: Goal: Ability to manage health-related needs will improve Outcome: Progressing   

## 2020-11-30 NOTE — Progress Notes (Signed)
Pharmacy Antibiotic Note  Kayla Griffin is a 80 y.o. female admitted on 11/29/2020 with cellulitis.  Pharmacy has been consulted for vancomycin dosing.  Today, 11/30/20 -WBC slightly elevated -SCr 0.66. Remains WNL, stable. CrCl ~55 mL/min -Lactate down to 1.6 -Afebrile  Currently day #2 of IV antibiotics on ceftriaxone + vancomycin  Plan: Will decrease vancomycin dose to 1000 mg IV q24h for estimated AUC of 513 Monitor renal function, check vancomycin levels at steady state if indicated. Goal vancomycin AUC 400-550  Height: '5\' 4"'$  (162.6 cm) Weight: 73.9 kg (163 lb) IBW/kg (Calculated) : 54.7  Temp (24hrs), Avg:98.8 F (37.1 C), Min:98.2 F (36.8 C), Max:100.2 F (37.9 C)  Recent Labs  Lab 11/29/20 1009 11/29/20 1023 11/29/20 1304 11/29/20 1545 11/29/20 1701 11/30/20 0910  WBC 12.3*  --   --   --   --  11.7*  CREATININE 0.75  --   --   --   --  0.66  LATICACIDVEN  --  3.3* 2.6* 2.4* 1.6  --     Estimated Creatinine Clearance: 55.3 mL/min (by C-G formula based on SCr of 0.66 mg/dL).    Allergies  Allergen Reactions   Actos [Pioglitazone] Swelling    SWELLING REACTION UNSPECIFIED    Cephalexin Nausea And Vomiting    Antimicrobials this admission: Ceftriaxone 8/22 >>  vancomycin 8/22 >>   Dose adjustments this admission:  Microbiology results: 8/22 BCx: ngtd  Thank you for allowing pharmacy to be a part of this patient's care.  Lenis Noon, PharmD 11/30/2020 10:01 AM

## 2020-12-01 LAB — CREATININE, SERUM
Creatinine, Ser: 0.56 mg/dL (ref 0.44–1.00)
GFR, Estimated: >60 mL/min (ref >60)

## 2020-12-01 LAB — GLUCOSE, CAPILLARY
Glucose-Capillary: 169 mg/dL — ABNORMAL HIGH (ref 70–99)
Glucose-Capillary: 183 mg/dL — ABNORMAL HIGH (ref 70–99)
Glucose-Capillary: 146 mg/dL — ABNORMAL HIGH (ref 70–99)
Glucose-Capillary: 174 mg/dL — ABNORMAL HIGH (ref 70–99)

## 2020-12-01 LAB — BASIC METABOLIC PANEL
Anion gap: 11 (ref 5–15)
BUN: 11 mg/dL (ref 8–23)
CO2: 25 mmol/L (ref 22–32)
Calcium: 8.6 mg/dL — ABNORMAL LOW (ref 8.9–10.3)
Chloride: 98 mmol/L (ref 98–111)
Creatinine, Ser: 0.58 mg/dL (ref 0.44–1.00)
GFR, Estimated: >60 mL/min (ref >60)
Glucose, Bld: 155 mg/dL — ABNORMAL HIGH (ref 70–99)
Potassium: 3 mmol/L — ABNORMAL LOW (ref 3.5–5.1)
Sodium: 134 mmol/L — ABNORMAL LOW (ref 135–145)

## 2020-12-01 LAB — PROCALCITONIN: Procalcitonin: <0.1 ng/mL

## 2020-12-01 MED ORDER — POTASSIUM CHLORIDE CRYS ER 10 MEQ PO TBCR
20.0000 meq | EXTENDED_RELEASE_TABLET | Freq: Every day | ORAL | Status: DC
  Administered 2020-12-01: 20 meq via ORAL
  Filled 2020-12-01: qty 2

## 2020-12-01 MED ORDER — HYDROCHLOROTHIAZIDE 25 MG PO TABS
25.0000 mg | ORAL_TABLET | Freq: Every day | ORAL | Status: DC
  Administered 2020-12-01 – 2020-12-02 (×2): 25 mg via ORAL
  Filled 2020-12-01 (×2): qty 1

## 2020-12-01 MED ORDER — POTASSIUM CHLORIDE CRYS ER 10 MEQ PO TBCR
40.0000 meq | EXTENDED_RELEASE_TABLET | Freq: Once | ORAL | Status: AC
  Administered 2020-12-01: 40 meq via ORAL
  Filled 2020-12-01 (×2): qty 4

## 2020-12-01 NOTE — Progress Notes (Signed)
PROGRESS NOTE    Kayla Griffin  [id number] DOB: [birth year] DOA: 11/29/2020 PCP: Aretta Nip, MD   Chief Complaint  Patient presents with   Hand Pain   Brief Narrative:  80 y.o. female with medical history significant for diabetes mellitus on insulin, HLD, HTN, arthritis, paroxysmal atrial fibrillation presented to [place] with left hand/wrist swelling for 3 days. No recent injury, no obvious bite but she had small area of redness in the dorsum of hand ? Bite or injury. Her symptoms have been worsening and also has had, low-grade fever. Patient otherwise denies any nausea, vomiting, chest pain, shortness of breath, fever, chills, headache, focal weakness, numbness tingling, speech difficulties.     ED Course:  Vitals unremarkable except for temperature 100.4 heart rate 94, saturating high percent blood pressure [id number] systolic.  Labs showed leukocytosis and lactic acidosis 3.3.  Patient was found to have left hand/wrist swelling felt to be cellulitis versus pseudogout versus gout.  x-ray left wrist/hand showed "diffuse soft tissue edema about the hand and wrist. 3. Mild osteoarthritic pattern arthrosis. 4. Osteopenia" .  Chest x-ray showed pulmonary congestion.  Patient was given 1 L bolus normal saline, vancomycin and ceftriaxone and LR 150 mill per hour and admitted.  Given her diabetes and also streaking up the arm she is being admitted for antibiotics   Subjective: Reports her swelling is much better and able to close her finger and hold the phone of the left hand.  Still tender and swollen  left hand  Assessment & Plan:  Sepsis secondary to left wrist/hand cellulitis : Patient had tachycardia lactic acidosis leukocytosis and at this time much improved.  Antibiotic transitioned from vancomycin and ceftriaxone to iv Ancef swelling erythema improving able to move and flex left hand .  We will continue on current IV antibiotics at least next 24 hours.  Blood  culture no growth so far. Recent Labs  Lab 11/29/20 1009 11/29/20 1023 11/29/20 1304 11/29/20 1545 11/29/20 1701 11/30/20 0910 12/01/20 0709  WBC 12.3*  --   --   --   --  11.7*  --   LATICACIDVEN  --  3.3* 2.6* 2.4* 1.6  --   --   PROCALCITON  --   --   --  <0.10  --  <0.10 <0.10     Hypokalemia replaced with 40 KCl and add 20 KCl daily while on HCTZ   Hypertension: Blood pressure uncontrolled continue metoprolol, ramipril, resume HCTZ.    Insulin-requiring or dependent type II diabetes mellitus : On mix 70/30  15 u BID- increased to home dose, cont ssi.hba1c p6.7 blood sugar fairly controlled.  Monitor.   Recent Labs  Lab 11/30/20 1224 11/30/20 1727 11/30/20 2159 12/01/20 0756 12/01/20 1141  GLUCAP 164* 123* 80 169* 183*     Hyponatremia: Multifactorial due to hyperglycemia as well as due to #1 adb hctz-stable now.  Off fluids received HCTZ and monitor  Recent Labs  Lab 11/29/20 1009 11/30/20 0910 12/01/20 0709  NA 131* 128* 134*      PAF: Rate controlled cont her apixaban and metoprolol  Hyperthyroidism on her methimazole  Hld- cont Lipitor.   Body mass index is 27.98 kg/m-outpatient PCP follow-up  Diet Order             Diet Carb Modified Fluid consistency: Thin; Room service appropriate? Yes  Diet effective now  Patient's Body mass index is 27.98 kg/m. DVT prophylaxis: SCDs Start: 11/29/20 1518 eliquis Code Status:   Code Status: Full Code  Family Communication: plan of care discussed with patient at bedside. Status is: Inpatient  The patient remains inpatient for continued IV antibiotics due to cellulitis and sepsis  Dispo: The patient is from: Home              Anticipated d/c is to: Home              Patient currently is not medically stable to d/c.   Difficult to place patient No Unresulted Labs (From admission, onward)     Start     Ordered   12/02/20 [id number]  Basic metabolic panel  Daily,   R     Question:  Specimen  collection method  Answer:  Lab=Lab collect   12/01/20 0739            Medications reviewed:  Scheduled Meds:  apixaban  5 mg Oral BID   atorvastatin  40 mg Oral Daily   dorzolamide  1 drop Both Eyes BID   insulin aspart  0-5 Units Subcutaneous QHS   insulin aspart  0-9 Units Subcutaneous TID WC   insulin aspart protamine- aspart  15 Units Subcutaneous BID WC   latanoprost  1 drop Both Eyes QHS   methimazole  10 mg Oral Daily   metoprolol tartrate  100 mg Oral BID   potassium chloride  20 mEq Oral Daily   ramipril  20 mg Oral Daily   Continuous Infusions:   ceFAZolin (ANCEF) IV 1 g (12/01/20 0538)   Consultants:see note  Procedures:see note Antimicrobials: Anti-infectives (From admission, onward)    Start     Dose/Rate Route Frequency Ordered Stop   12/01/20 0800  vancomycin (VANCOCIN) IVPB 1000 mg/200 mL premix  Status:  Discontinued        1,000 mg 200 mL/hr over 60 Minutes Intravenous Every 24 hours 11/30/20 1009 11/30/20 1154   12/01/20 0600  ceFAZolin (ANCEF) IVPB 1 g/50 mL premix        1 g 100 mL/hr over 30 Minutes Intravenous Every 8 hours 11/30/20 1154     11/29/20 2200  vancomycin (VANCOREADY) IVPB 750 mg/150 mL  Status:  Discontinued        750 mg 150 mL/hr over 60 Minutes Intravenous Every 12 hours 11/29/20 1110 11/30/20 1001   11/29/20 1045  cefTRIAXone (ROCEPHIN) 1 g in sodium chloride 0.9 % 100 mL IVPB  Status:  Discontinued        1 g 200 mL/hr over 30 Minutes Intravenous Every 24 hours 11/29/20 1030 11/30/20 1154   11/29/20 1030  vancomycin (VANCOCIN) IVPB 1000 mg/200 mL premix        1,000 mg 200 mL/hr over 60 Minutes Intravenous  Once 11/29/20 1019 11/29/20 1242   11/29/20 1030  levofloxacin (LEVAQUIN) IVPB 750 mg  Status:  Discontinued        750 mg 100 mL/hr over 90 Minutes Intravenous  Once 11/29/20 1019 11/29/20 1030      Culture/Microbiology    Component Value Date/Time   SDES  11/29/2020 1100    BLOOD RIGHT HAND Performed at [facility], [address] [place], [place] [address]    [facility]  11/29/2020 1100    BOTTLES DRAWN AEROBIC AND ANAEROBIC Blood Culture adequate volume Performed at [facility], [address], [place], [place] [address]    CULT  11/29/2020  1100    NO GROWTH 2 DAYS Performed at [facility], [place] [address] [place], [place] [address]    REPTSTATUS PENDING 11/29/2020 1100    Other culture-see note  Objective: Vitals: Today's Vitals   11/30/20 0828 11/30/20 0900 11/30/20 2036 12/01/20 0533  BP: (!) 156/91  (!) 153/88 (!) 166/85  Pulse: 88  73 77  Resp: '20  18 18  '$ Temp: 98.4 F (36.9 C)  99 F (37.2 C) 98.1 F (36.7 C)  TempSrc: Oral  Oral Oral  SpO2: 99%  99% 95%  Weight:      Height:      PainSc:  0-No pain 2      Intake/Output Summary (Last 24 hours) at 12/01/2020 1202 Last data filed at 12/01/2020 0538 Gross per 24 hour  Intake 340 ml  Output 750 ml  Net -410 ml    Filed Weights   11/29/20 0944  Weight: 73.9 kg   Weight change:   Intake/Output from previous day: 08/23 0701 - 08/24 0700 In: 820 [P.O.:720; IV Piggyback:100] Out: 1100 [Urine:1100] Intake/Output this shift: No intake/output data recorded. Filed Weights   11/29/20 0944  Weight: 73.9 kg   Examination: General exam: AAOx 3, elderly, pleasant older than stated age, weak appearing. HEENT:Oral mucosa moist, Ear/Nose WNL grossly, dentition normal. Respiratory system: bilaterally diminished,  no use of accessory muscle Cardiovascular system: S1 & S2 +, No JVD,. Gastrointestinal system: Abdomen soft, NT,ND, BS+ Nervous System:Alert, awake, moving extremities and grossly nonfocal Extremities: Swollen left hand but improved from previous with decreased tenderness. , Able to move all fingers and able to hold phone on the left hand  Skin: No rashes,no icterus. MSK: Normal muscle bulk,tone, power     Data Reviewed: I have personally reviewed following labs and imaging  studies CBC: Recent Labs  Lab 11/29/20 1009 11/30/20 0910  WBC 12.3* 11.7*  NEUTROABS 9.4*  --   HGB 13.3 11.8*  HCT 40.6 36.0  MCV 84.9 85.5  PLT 232 [id number]    Basic Metabolic Panel: Recent Labs  Lab 11/29/20 1009 11/30/20 0910 12/01/20 0709  NA 131* 128* 134*  K 4.2 3.5 3.0*  CL 95* 96* 98  CO2 24 21* 25  GLUCOSE 256* 272* 155*  BUN '18 13 11  '$ CREATININE 0.75 0.66 0.58  0.56  CALCIUM 9.5 8.5* 8.6*    GFR: Estimated Creatinine Clearance: 55.3 mL/min (by C-G formula based on SCr of 0.56 mg/dL). Liver Function Tests: Recent Labs  Lab 11/29/20 1009  AST 25  ALT 20  ALKPHOS 69  BILITOT 1.4*  PROT 7.9  ALBUMIN 3.5    No results for input(s): LIPASE, AMYLASE in the last 168 hours. No results for input(s): AMMONIA in the last 168 hours. Coagulation Profile: Recent Labs  Lab 11/29/20 1023  INR 1.6*    Cardiac Enzymes: No results for input(s): CKTOTAL, CKMB, CKMBINDEX, TROPONINI in the last 168 hours. BNP (last 3 results) No results for input(s): PROBNP in the last 8760 hours. HbA1C: Recent Labs    11/30/20 0910  HGBA1C 6.7*   CBG: Recent Labs  Lab 11/30/20 1224 11/30/20 1727 11/30/20 2159 12/01/20 0756 12/01/20 1141  GLUCAP 164* 123* 80 169* 183*    Lipid Profile: No results for input(s): CHOL, HDL, LDLCALC, TRIG, CHOLHDL, LDLDIRECT in the last 72 hours. Thyroid Function Tests: No results for input(s): TSH, T4TOTAL, FREET4, T3FREE, THYROIDAB in the last 72 hours. Anemia Panel: No results for input(s): VITAMINB12, FOLATE, FERRITIN, TIBC, IRON, RETICCTPCT in  the last 72 hours. Sepsis Labs: Recent Labs  Lab 11/29/20 1023 11/29/20 1304 11/29/20 1545 11/29/20 1701 11/30/20 0910 12/01/20 0709  PROCALCITON  --   --  <0.10  --  <0.10 <0.10  LATICACIDVEN 3.3* 2.6* 2.4* 1.6  --   --      Recent Results (from the past 240 hour(s))  Resp Panel by RT-PCR (Flu A&B, Covid) Nasopharyngeal Swab     Status: None   Collection Time: 11/29/20 10:23 AM    Specimen: Nasopharyngeal Swab; Nasopharyngeal(NP) swabs in vial transport medium  Result Value Ref Range Status   SARS Coronavirus 2 by RT PCR NEGATIVE NEGATIVE Final    Comment: (NOTE) SARS-CoV-2 target nucleic acids are NOT DETECTED.  The SARS-CoV-2 RNA is generally detectable in upper respiratory specimens during the acute phase of infection. The lowest concentration of SARS-CoV-2 viral copies this assay can detect is 138 copies/mL. A negative result does not preclude SARS-Cov-2 infection and should not be used as the sole basis for treatment or other patient management decisions. A negative result may occur with  improper specimen collection/handling, submission of specimen other than nasopharyngeal swab, presence of viral mutation(s) within the areas targeted by this assay, and inadequate number of viral copies(<138 copies/mL). A negative result must be combined with clinical observations, patient history, and epidemiological information. The expected result is Negative.  Fact Sheet for Patients:  EntrepreneurPulse.com.au  Fact Sheet for Healthcare Providers:  IncredibleEmployment.be  This test is no t yet approved or cleared by the [place] FDA and  has been authorized for detection and/or diagnosis of SARS-CoV-2 by FDA under an Emergency Use Authorization (EUA). This EUA will remain  in effect (meaning this test can be used) for the duration of the COVID-19 declaration under Section 564(b)(1) of the Act, 21 U.S.C.section 360bbb-3(b)(1), unless the authorization is terminated  or revoked sooner.       Influenza A by PCR NEGATIVE NEGATIVE Final   Influenza B by PCR NEGATIVE NEGATIVE Final    Comment: (NOTE) The Xpert Xpress SARS-CoV-2/FLU/RSV plus assay is intended as an aid in the diagnosis of influenza from Nasopharyngeal swab specimens and should not be used as a sole basis for treatment. Nasal washings and aspirates are  unacceptable for Xpert Xpress SARS-CoV-2/FLU/RSV testing.  Fact Sheet for Patients: EntrepreneurPulse.com.au  Fact Sheet for Healthcare Providers: IncredibleEmployment.be  This test is not yet approved or cleared by the [place] FDA and has been authorized for detection and/or diagnosis of SARS-CoV-2 by FDA under an Emergency Use Authorization (EUA). This EUA will remain in effect (meaning this test can be used) for the duration of the COVID-19 declaration under Section 564(b)(1) of the Act, 21 U.S.C. section 360bbb-3(b)(1), unless the authorization is terminated or revoked.  Performed at [facility], [address] [place], [place] [address]   Blood Culture (routine x 2)     Status: None (Preliminary result)   Collection Time: 11/29/20 10:29 AM   Specimen: BLOOD  Result Value Ref Range Status   Specimen Description   Final    BLOOD RIGHT ARM Performed at [facility], [address] [place], [place] [address]    Special Requests   Final    BOTTLES DRAWN AEROBIC AND ANAEROBIC Blood Culture adequate volume Performed at [facility], [address] [place], [place] [address]    Culture   Final    NO GROWTH 2 DAYS Performed at [facility], [place]  [address] [place], [place] [address]    Report Status PENDING  Incomplete  Blood Culture (routine x 2)     Status: None (Preliminary result)   Collection Time: 11/29/20 11:00 AM   Specimen: BLOOD  Result Value Ref Range Status   Specimen Description   Final    BLOOD RIGHT HAND Performed at [facility], [address] [place], [place] [address]    Special Requests   Final    BOTTLES DRAWN AEROBIC AND ANAEROBIC Blood Culture adequate volume Performed at [facility], [address] [place], [place] [address]    Culture   Final    NO GROWTH 2 DAYS Performed at [facility], [place] [address], [place], [place] [address]     Report Status PENDING  Incomplete      Radiology Studies: DG Chest Portable 1 View  Result Date: 11/29/2020 CLINICAL DATA:  Fever EXAM: PORTABLE CHEST 1 VIEW COMPARISON:  09/14/2019 FINDINGS: No consolidation. Similar bilateral hilar prominence is likely vascular. Mild pulmonary vascular congestion. Mild cardiomegaly. Probable enlargement of the main pulmonary artery. No significant pleural effusion. No pneumothorax. IMPRESSION: Mild pulmonary vascular congestion. Mild cardiomegaly and probable enlargement of the main pulmonary artery. Electronically Signed   By: Macy Mis M.D.   On: 11/29/2020 12:40     LOS: 1 day   Antonieta Pert, MD [facility]  12/01/2020, 12:02 PM

## 2020-12-01 NOTE — Progress Notes (Signed)
   12/01/20 1200  Mobility  Activity Ambulated in hall  Level of Assistance Standby assist, set-up cues, supervision of patient - no hands on  Assistive Device Front wheel walker  Distance Ambulated (ft) 300 ft  Mobility Ambulated with assistance in hallway  Mobility Response Tolerated well  Mobility performed by Mobility specialist  $Mobility charge 1 Mobility   Upon entering room, pt was walking independently to the restroom. Assistance was refused to get to and from the rest room. Fraser Din stated she has been "doing this since she got here". Prior to entering the restroom, pt requested RW for hallway mobility. When pt was finished, she ambulated about 360f in the hallway with RW, tolerated well. No other complaints. Upon returning to the room, pt stated she does not need a companion to walk with her now that she has a RW in her room. Pt left in bed with call bell at side, and NT present.    KFairbornSpecialist Acute Rehab Services Office: [phone]

## 2020-12-02 DIAGNOSIS — L03114 Cellulitis of left upper limb: Secondary | ICD-10-CM | POA: Diagnosis not present

## 2020-12-02 LAB — GLUCOSE, CAPILLARY
Glucose-Capillary: 153 mg/dL — ABNORMAL HIGH (ref 70–99)
Glucose-Capillary: 185 mg/dL — ABNORMAL HIGH (ref 70–99)

## 2020-12-02 LAB — BASIC METABOLIC PANEL
Anion gap: 10 (ref 5–15)
BUN: 14 mg/dL (ref 8–23)
CO2: 26 mmol/L (ref 22–32)
Calcium: 8.3 mg/dL — ABNORMAL LOW (ref 8.9–10.3)
Chloride: 96 mmol/L — ABNORMAL LOW (ref 98–111)
Creatinine, Ser: 0.62 mg/dL (ref 0.44–1.00)
GFR, Estimated: >60 mL/min (ref >60)
Glucose, Bld: 152 mg/dL — ABNORMAL HIGH (ref 70–99)
Potassium: 3.3 mmol/L — ABNORMAL LOW (ref 3.5–5.1)
Sodium: 132 mmol/L — ABNORMAL LOW (ref 135–145)

## 2020-12-02 MED ORDER — POTASSIUM CHLORIDE CRYS ER 10 MEQ PO TBCR
20.0000 meq | EXTENDED_RELEASE_TABLET | Freq: Every day | ORAL | Status: DC
  Administered 2020-12-02: 20 meq via ORAL
  Filled 2020-12-02: qty 2

## 2020-12-02 MED ORDER — CEFADROXIL 500 MG PO CAPS: 500.0000 mg | ORAL_CAPSULE | Freq: Two times a day (BID) | ORAL | 0 refills | Status: AC

## 2020-12-02 MED ORDER — SACCHAROMYCES BOULARDII 250 MG PO CAPS: 250.0000 mg | ORAL_CAPSULE | Freq: Two times a day (BID) | ORAL | 0 refills | Status: AC

## 2020-12-02 MED ORDER — ONDANSETRON HCL 4 MG/2ML IJ SOLN
4.0000 mg | Freq: Once | INTRAMUSCULAR | Status: AC
  Administered 2020-12-02: 4 mg via INTRAVENOUS
  Filled 2020-12-02: qty 2

## 2020-12-02 MED ORDER — POTASSIUM CHLORIDE CRYS ER 10 MEQ PO TBCR
40.0000 meq | EXTENDED_RELEASE_TABLET | Freq: Once | ORAL | Status: AC
  Administered 2020-12-02: 40 meq via ORAL
  Filled 2020-12-02: qty 4

## 2020-12-02 MED ORDER — ONDANSETRON HCL 4 MG PO TABS: 4.0000 mg | ORAL_TABLET | Freq: Three times a day (TID) | ORAL | 0 refills | Status: DC | PRN

## 2020-12-02 MED ORDER — CEFADROXIL 500 MG PO CAPS
500.0000 mg | ORAL_CAPSULE | Freq: Two times a day (BID) | ORAL | Status: DC
  Administered 2020-12-02: 500 mg via ORAL
  Filled 2020-12-02: qty 1

## 2020-12-02 NOTE — Discharge Summary (Signed)
Physician Discharge Summary  Kayla Griffin [id number] DOB: 12/18/1940 DOA: 11/29/2020  PCP: Aretta Nip, MD  Admit date: 11/29/2020 Discharge date: 12/02/2020  Admitted From: home Disposition:  hom  Recommendations for Outpatient Follow-up:  Follow up with PCP in 1-2 weeks Please obtain BMP/CBC in one week  Home Health:no  Equipment/Devices: none  Discharge Condition: Stable Code Status:   Code Status: Full Code Diet recommendation:  Diet Order             Diet Carb Modified Fluid consistency: Thin; Room service appropriate? Yes  Diet effective now                    Brief/Interim Summary: 80 y.o. female with medical history significant for diabetes mellitus on insulin, HLD, HTN, arthritis, paroxysmal atrial fibrillation presented to [facility] with left hand/wrist swelling for 3 days. No recent injury, no obvious bite but she had small area of redness in the dorsum of hand ? Bite or injury. Her symptoms have been worsening and also has had, low-grade fever. Patient otherwise denies any nausea, vomiting, chest pain, shortness of breath, fever, chills, headache, focal weakness, numbness tingling, speech difficulties.    ED Course:  Vitals unremarkable except for temperature 100.4 heart rate 94, saturating high percent blood pressure [id number] systolic.  Labs showed leukocytosis and lactic acidosis 3.3.  Patient was found to have left hand/wrist swelling felt to be cellulitis versus pseudogout versus gout.  x-ray left wrist/hand showed "diffuse soft tissue edema about the hand and wrist. 3. Mild osteoarthritic pattern arthrosis. 4. Osteopenia" .  Chest x-ray showed pulmonary congestion.  Patient was given 1 L bolus normal saline, vancomycin and ceftriaxone and LR 150 mill per hour and admitted.  Given her diabetes and also streaking up the arm she is being admitted for antibiotics. Her cellulitis sepsis improved lactic acid is normalized.  She was transitioned  to IV Ancef blood culture negative.  At this time edema erythema and pain has significantly improved she is able to close her left fist and wants to go home.  She has been asking to go home at this time she feels 70 to 80% better, will discharge on oral antibiotics with follow-up with PCP next week. Patient tolerated cefadroxil p.o. while and she will be going home today.  Discharge Diagnoses:  Sepsis secondary to left wrist/hand cellulitis : Patient had tachycardia lactic acidosis leukocytosis and at this time much improved.  Antibiotic transitioned from vancomycin and ceftriaxone to iv Ancef swelling erythema improved nicely almost 7080% better as per the patient.  She would like to go home today changing to cefadroxil to make sure she is tolerating p.o. prior to discharge home.  Previously she had nausea with cephalexin.  We will also give her Zofran.   Recent Labs  Lab 11/29/20 1009 11/29/20 1023 11/29/20 1304 11/29/20 1545 11/29/20 1701 11/30/20 0910 12/01/20 0709  WBC 12.3*  --   --   --   --  11.7*  --   LATICACIDVEN  --  3.3* 2.6* 2.4* 1.6  --   --   PROCALCITON  --   --   --  <0.10  --  <0.10 <0.10    Hypokalemia replaced  cont 20 KCl daily while on HCTZ Recent Labs  Lab 11/29/20 1009 11/30/20 0910 12/01/20 0709 12/02/20 0742  K 4.2 3.5 3.0* 3.3*     Hypertension: Blood pressure stable- continue metoprolol, ramipril, HCTZ.    Insulin-requiring or dependent  type II diabetes mellitus : On mix 70/30  15 u BID-continue home dose and resume rest of the oral medication upon discharge. hba1c p 6.7  Recent Labs  Lab 12/01/20 1141 12/01/20 1644 12/01/20 2015 12/02/20 0746 12/02/20 1157  GLUCAP 183* 146* 174* 153* 185*    Hyponatremia: Multifactorial due to hyperglycemia as well as due to #1 adb hctz-stable now.  Off fluids received HCTZ. Bmp with PCP in 1 wk Recent Labs  Lab 11/29/20 1009 11/30/20 0910 12/01/20 0709 12/02/20 0742  NA 131* 128* 134* 132*     PAF: Rate  controlled cont her apixaban and metoprolol  Hyperthyroidism on her methimazole  Hld- cont Lipitor.   Body mass index is 27.98 kg/m-outpatient PCP follow-up  Consults: none  Subjective: Alert awake oriented, left hand less swollen and less red less painful able to close it.  Discharge Exam: Vitals:   12/02/20 0526 12/02/20 1217  BP: 139/86 132/89  Pulse: 75 80  Resp: 20   Temp: 97.8 F (36.6 C)   SpO2: 95% 97%   General: Pt is alert, awake, not in acute distress Cardiovascular: RRR, S1/S2 +, no rubs, no gallops Respiratory: CTA bilaterally, no wheezing, no rhonchi Abdominal: Soft, NT, ND, bowel sounds + Extremities: no edema, no cyanosis  Discharge Instructions  Discharge Instructions     Discharge instructions   Complete by: As directed    Please call call MD or return to ER for similar or worsening recurring problem that brought you to hospital or if any fever,nausea/vomiting,abdominal pain, uncontrolled pain, chest pain,  shortness of breath or any other alarming symptoms.  Please follow-up your doctor as instructed in a week time and call the office for appointment.  Please avoid alcohol, smoking, or any other illicit substance and maintain healthy habits including taking your regular medications as prescribed.  You were cared for by a hospitalist during your hospital stay. If you have any questions about your discharge medications or the care you received while you were in the hospital after you are discharged, you can call the unit and ask to speak with the hospitalist on call if the hospitalist that took care of you is not available.  Once you are discharged, your primary care physician will handle any further medical issues. Please note that NO REFILLS for any discharge medications will be authorized once you are discharged, as it is imperative that you return to your primary care physician (or establish a relationship with a primary care physician if you do not  have one) for your aftercare needs so that they can reassess your need for medications and monitor your lab values   Increase activity slowly   Complete by: As directed       Allergies as of 12/02/2020       Reactions   Actos [pioglitazone] Swelling   SWELLING REACTION UNSPECIFIED    Cephalexin Nausea And Vomiting        Medication List     TAKE these medications    alendronate 70 MG tablet Commonly known as: FOSAMAX Take 70 mg by mouth every Sunday. Take with a full glass of water on an empty stomach.   apixaban 5 MG Tabs tablet Commonly known as: ELIQUIS Take 1 tablet (5 mg total) by mouth 2 (two) times daily.   atorvastatin 40 MG tablet Commonly known as: LIPITOR Take 40 mg by mouth daily.   CALCIUM 1200 PO Take 1,200 mg by mouth daily.   cefadroxil 500 MG capsule Commonly known  as: DURICEF Take 1 capsule (500 mg total) by mouth 2 (two) times daily for 5 days.   dorzolamide 2 % ophthalmic solution Commonly known as: TRUSOPT Place 1 drop into both eyes 2 (two) times daily.   hydrochlorothiazide 25 MG tablet Commonly known as: HYDRODIURIL Take 25 mg by mouth daily.   insulin NPH-regular Human (70-30) 100 UNIT/ML injection Inject 15 Units into the skin 2 (two) times daily with a meal.   latanoprost 0.005 % ophthalmic solution Commonly known as: XALATAN Place 1 drop into both eyes at bedtime.   metFORMIN 500 MG tablet Commonly known as: GLUCOPHAGE Take 500 mg by mouth 2 (two) times daily with a meal.   methimazole 10 MG tablet Commonly known as: TAPAZOLE Take 10 mg by mouth daily.   metoprolol tartrate 100 MG tablet Commonly known as: LOPRESSOR Take 100 mg by mouth 2 (two) times daily.   ondansetron 4 MG tablet Commonly known as: Zofran Take 1 tablet (4 mg total) by mouth every 8 (eight) hours as needed for nausea or vomiting.   ramipril 10 MG capsule Commonly known as: ALTACE Take 20 mg by mouth daily.   saccharomyces boulardii 250 MG  capsule Commonly known as: Florastor Take 1 capsule (250 mg total) by mouth 2 (two) times daily for 10 days.        Follow-up Information     [name], [name], MD Follow up in 1 week(s).   Specialty: Family Medicine Contact information: [place] [address] [phone]         Jerline Pain, MD .   Specialty: Cardiology Contact information: [phone] [place] [address] [phone]                Allergies  Allergen Reactions   Actos [Pioglitazone] Swelling    SWELLING REACTION UNSPECIFIED    Cephalexin Nausea And Vomiting    The results of significant diagnostics from this hospitalization (including imaging, microbiology, ancillary and laboratory) are listed below for reference.    Microbiology: Recent Results (from the past 240 hour(s))  Resp Panel by RT-PCR (Flu A&B, Covid) Nasopharyngeal Swab     Status: None   Collection Time: 11/29/20 10:23 AM   Specimen: Nasopharyngeal Swab; Nasopharyngeal(NP) swabs in vial transport medium  Result Value Ref Range Status   SARS Coronavirus 2 by RT PCR NEGATIVE NEGATIVE Final    Comment: (NOTE) SARS-CoV-2 target nucleic acids are NOT DETECTED.  The SARS-CoV-2 RNA is generally detectable in upper respiratory specimens during the acute phase of infection. The lowest concentration of SARS-CoV-2 viral copies this assay can detect is 138 copies/mL. A negative result does not preclude SARS-Cov-2 infection and should not be used as the sole basis for treatment or other patient management decisions. A negative result may occur with  improper specimen collection/handling, submission of specimen other than nasopharyngeal swab, presence of viral mutation(s) within the areas targeted by this assay, and inadequate number of viral copies(<138 copies/mL). A negative result must be combined with clinical observations, patient history, and epidemiological information. The expected  result is Negative.  Fact Sheet for Patients:  EntrepreneurPulse.com.au  Fact Sheet for Healthcare Providers:  IncredibleEmployment.be  This test is no t yet approved or cleared by the [place] FDA and  has been authorized for detection and/or diagnosis of SARS-CoV-2 by FDA under an Emergency Use Authorization (EUA). This EUA will remain  in effect (meaning this test can be used) for the duration of the  COVID-19 declaration under Section 564(b)(1) of the Act, 21 U.S.C.section 360bbb-3(b)(1), unless the authorization is terminated  or revoked sooner.       Influenza A by PCR NEGATIVE NEGATIVE Final   Influenza B by PCR NEGATIVE NEGATIVE Final    Comment: (NOTE) The Xpert Xpress SARS-CoV-2/FLU/RSV plus assay is intended as an aid in the diagnosis of influenza from Nasopharyngeal swab specimens and should not be used as a sole basis for treatment. Nasal washings and aspirates are unacceptable for Xpert Xpress SARS-CoV-2/FLU/RSV testing.  Fact Sheet for Patients: EntrepreneurPulse.com.au  Fact Sheet for Healthcare Providers: IncredibleEmployment.be  This test is not yet approved or cleared by the [place] FDA and has been authorized for detection and/or diagnosis of SARS-CoV-2 by FDA under an Emergency Use Authorization (EUA). This EUA will remain in effect (meaning this test can be used) for the duration of the COVID-19 declaration under Section 564(b)(1) of the Act, 21 U.S.C. section 360bbb-3(b)(1), unless the authorization is terminated or revoked.  Performed at [facility], [address] [place], [place] [address]   Blood Culture (routine x 2)     Status: None (Preliminary result)   Collection Time: 11/29/20 10:29 AM   Specimen: BLOOD  Result Value Ref Range Status   Specimen Description   Final    BLOOD RIGHT ARM Performed at [facility], [address]  [place], [place] [address]    Special Requests   Final    BOTTLES DRAWN AEROBIC AND ANAEROBIC Blood Culture adequate volume Performed at [facility], [address] [place], [place] [address]    Culture   Final    NO GROWTH 3 DAYS Performed at [facility], [address] [place], [place] [address]    Report Status PENDING  Incomplete  Blood Culture (routine x 2)     Status: None (Preliminary result)   Collection Time: 11/29/20 11:00 AM   Specimen: BLOOD  Result Value Ref Range Status   Specimen Description   Final    BLOOD RIGHT HAND Performed at [facility], [address] [place], [place] [address]    Special Requests   Final    BOTTLES DRAWN AEROBIC AND ANAEROBIC Blood Culture adequate volume Performed at [facility], [address] [place], [place] [address]    Culture   Final    NO GROWTH 3 DAYS Performed at [facility], [address], [place], [place] [address]    Report Status PENDING  Incomplete    Procedures/Studies: DG Wrist Complete Left  Result Date: 11/29/2020 CLINICAL DATA:  Hand and wrist pain, swelling, and redness, no known injury EXAM: LEFT HAND - COMPLETE 3+ VIEW; LEFT WRIST - COMPLETE 3+ VIEW COMPARISON:  None. FINDINGS: Osteopenia. There is no evidence of acute fracture or dislocation. Probable chronic, callused fracture deformity of the distal left radial metadiaphysis. Generally mild osteoarthritic pattern arthrosis about the hand and wrist. Diffuse soft tissue edema about the hand and wrist. IMPRESSION: 1. No evidence of acute fracture or dislocation. Probable chronic, callused fracture deformity of the distal left radial metadiaphysis. 2. Diffuse soft tissue edema about the hand and wrist. 3. Mild osteoarthritic pattern arthrosis. 4. Osteopenia. Electronically Signed   By: Eddie Candle M.D.   On: 11/29/2020 11:13   DG Chest Portable 1 View  Result Date: 11/29/2020 CLINICAL DATA:  Fever EXAM: PORTABLE CHEST 1  VIEW COMPARISON:  09/14/2019 FINDINGS: No  consolidation. Similar bilateral hilar prominence is likely vascular. Mild pulmonary vascular congestion. Mild cardiomegaly. Probable enlargement of the main pulmonary artery. No significant pleural effusion. No pneumothorax. IMPRESSION: Mild pulmonary vascular congestion. Mild cardiomegaly and probable enlargement of the main pulmonary artery. Electronically Signed   By: Macy Mis M.D.   On: 11/29/2020 12:40   DG Hand Complete Left  Result Date: 11/29/2020 CLINICAL DATA:  Hand and wrist pain, swelling, and redness, no known injury EXAM: LEFT HAND - COMPLETE 3+ VIEW; LEFT WRIST - COMPLETE 3+ VIEW COMPARISON:  None. FINDINGS: Osteopenia. There is no evidence of acute fracture or dislocation. Probable chronic, callused fracture deformity of the distal left radial metadiaphysis. Generally mild osteoarthritic pattern arthrosis about the hand and wrist. Diffuse soft tissue edema about the hand and wrist. IMPRESSION: 1. No evidence of acute fracture or dislocation. Probable chronic, callused fracture deformity of the distal left radial metadiaphysis. 2. Diffuse soft tissue edema about the hand and wrist. 3. Mild osteoarthritic pattern arthrosis. 4. Osteopenia. Electronically Signed   By: Eddie Candle M.D.   On: 11/29/2020 11:13    Labs: BNP (last 3 results) No results for input(s): BNP in the last 8760 hours. Basic Metabolic Panel: Recent Labs  Lab 11/29/20 1009 11/30/20 0910 12/01/20 0709 12/02/20 0742  NA 131* 128* 134* 132*  K 4.2 3.5 3.0* 3.3*  CL 95* 96* 98 96*  CO2 24 21* 25 26  GLUCOSE 256* 272* 155* 152*  BUN '18 13 11 14  '$ CREATININE 0.75 0.66 0.58  0.56 0.62  CALCIUM 9.5 8.5* 8.6* 8.3*   Liver Function Tests: Recent Labs  Lab 11/29/20 1009  AST 25  ALT 20  ALKPHOS 69  BILITOT 1.4*  PROT 7.9  ALBUMIN 3.5   No results for input(s): LIPASE, AMYLASE in the last 168 hours. No results for input(s): AMMONIA in the last 168  hours. CBC: Recent Labs  Lab 11/29/20 1009 11/30/20 0910  WBC 12.3* 11.7*  NEUTROABS 9.4*  --   HGB 13.3 11.8*  HCT 40.6 36.0  MCV 84.9 85.5  PLT 232 220   Cardiac Enzymes: No results for input(s): CKTOTAL, CKMB, CKMBINDEX, TROPONINI in the last 168 hours. BNP: Invalid input(s): POCBNP CBG: Recent Labs  Lab 12/01/20 1141 12/01/20 1644 12/01/20 2015 12/02/20 0746 12/02/20 1157  GLUCAP 183* 146* 174* 153* 185*   D-Dimer No results for input(s): DDIMER in the last 72 hours. Hgb A1c Recent Labs    11/30/20 0910  HGBA1C 6.7*   Lipid Profile No results for input(s): CHOL, HDL, LDLCALC, TRIG, CHOLHDL, LDLDIRECT in the last 72 hours. Thyroid function studies No results for input(s): TSH, T4TOTAL, T3FREE, THYROIDAB in the last 72 hours.  Invalid input(s): FREET3 Anemia work up No results for input(s): VITAMINB12, FOLATE, FERRITIN, TIBC, IRON, RETICCTPCT in the last 72 hours. Urinalysis    Component Value Date/Time   COLORURINE YELLOW 11/30/2020 0911   APPEARANCEUR HAZY (A) 11/30/2020 0911   LABSPEC 1.012 11/30/2020 0911   PHURINE 7.0 11/30/2020 0911   GLUCOSEU NEGATIVE 11/30/2020 0911   HGBUR LARGE (A) 11/30/2020 0911   BILIRUBINUR NEGATIVE 11/30/2020 0911   KETONESUR 20 (A) 11/30/2020 0911   PROTEINUR 30 (A) 11/30/2020 0911   NITRITE NEGATIVE 11/30/2020 0911   LEUKOCYTESUR LARGE (A) 11/30/2020 0911   Sepsis Labs Invalid input(s): PROCALCITONIN,  WBC,  LACTICIDVEN Microbiology Recent Results (from the past 240 hour(s))  Resp Panel by RT-PCR (Flu A&B, Covid) Nasopharyngeal Swab     Status: None   Collection Time:  11/29/20 10:23 AM   Specimen: Nasopharyngeal Swab; Nasopharyngeal(NP) swabs in vial transport medium  Result Value Ref Range Status   SARS Coronavirus 2 by RT PCR NEGATIVE NEGATIVE Final    Comment: (NOTE) SARS-CoV-2 target nucleic acids are NOT DETECTED.  The SARS-CoV-2 RNA is generally detectable in upper respiratory specimens during the acute  phase of infection. The lowest concentration of SARS-CoV-2 viral copies this assay can detect is 138 copies/mL. A negative result does not preclude SARS-Cov-2 infection and should not be used as the sole basis for treatment or other patient management decisions. A negative result may occur with  improper specimen collection/handling, submission of specimen other than nasopharyngeal swab, presence of viral mutation(s) within the areas targeted by this assay, and inadequate number of viral copies(<138 copies/mL). A negative result must be combined with clinical observations, patient history, and epidemiological information. The expected result is Negative.  Fact Sheet for Patients:  EntrepreneurPulse.com.au  Fact Sheet for Healthcare Providers:  IncredibleEmployment.be  This test is no t yet approved or cleared by the [place] FDA and  has been authorized for detection and/or diagnosis of SARS-CoV-2 by FDA under an Emergency Use Authorization (EUA). This EUA will remain  in effect (meaning this test can be used) for the duration of the COVID-19 declaration under Section 564(b)(1) of the Act, 21 U.S.C.section 360bbb-3(b)(1), unless the authorization is terminated  or revoked sooner.       Influenza A by PCR NEGATIVE NEGATIVE Final   Influenza B by PCR NEGATIVE NEGATIVE Final    Comment: (NOTE) The Xpert Xpress SARS-CoV-2/FLU/RSV plus assay is intended as an aid in the diagnosis of influenza from Nasopharyngeal swab specimens and should not be used as a sole basis for treatment. Nasal washings and aspirates are unacceptable for Xpert Xpress SARS-CoV-2/FLU/RSV testing.  Fact Sheet for Patients: EntrepreneurPulse.com.au  Fact Sheet for Healthcare Providers: IncredibleEmployment.be  This test is not yet approved or cleared by the [place] FDA and has been authorized for detection and/or diagnosis of  SARS-CoV-2 by FDA under an Emergency Use Authorization (EUA). This EUA will remain in effect (meaning this test can be used) for the duration of the COVID-19 declaration under Section 564(b)(1) of the Act, 21 U.S.C. section 360bbb-3(b)(1), unless the authorization is terminated or revoked.  Performed at [facility], [address] [place], [place] [address]   Blood Culture (routine x 2)     Status: None (Preliminary result)   Collection Time: 11/29/20 10:29 AM   Specimen: BLOOD  Result Value Ref Range Status   Specimen Description   Final    BLOOD RIGHT ARM Performed at [facility], [address] [place], [place] [address]    Special Requests   Final    BOTTLES DRAWN AEROBIC AND ANAEROBIC Blood Culture adequate volume Performed at [facility], [address] [place], [place] [address]    Culture   Final    NO GROWTH 3 DAYS Performed at [facility], [place], [place], [place] [address]    Report Status PENDING  Incomplete  Blood Culture (routine x 2)     Status: None (Preliminary result)   Collection Time: 11/29/20 11:00 AM   Specimen: BLOOD  Result Value Ref Range Status   Specimen Description   Final    BLOOD RIGHT HAND Performed at [facility], [address], [place], [place] [address]    Special Requests   Final  BOTTLES DRAWN AEROBIC AND ANAEROBIC Blood Culture adequate volume Performed at [facility], [address] [place], [place] [address]    Culture   Final    NO GROWTH 3 DAYS Performed at [facility], [place] [address], [place], [place] [address]    Report Status PENDING  Incomplete     Time coordinating discharge: 35 minutes  SIGNED: Antonieta Pert, MD  [facility] 12/02/2020, 2:01 PM  If 7PM-7AM, please contact night-coverage [email or web address]

## 2020-12-04 LAB — CULTURE, BLOOD (ROUTINE X 2)
Culture: NO GROWTH
Culture: NO GROWTH
Special Requests: ADEQUATE
Special Requests: ADEQUATE

## 2020-12-06 DIAGNOSIS — L03114 Cellulitis of left upper limb: Secondary | ICD-10-CM | POA: Diagnosis not present

## 2020-12-16 DIAGNOSIS — H401232 Low-tension glaucoma, bilateral, moderate stage: Secondary | ICD-10-CM | POA: Diagnosis not present

## 2020-12-16 DIAGNOSIS — E0590 Thyrotoxicosis, unspecified without thyrotoxic crisis or storm: Secondary | ICD-10-CM | POA: Diagnosis not present

## 2020-12-16 DIAGNOSIS — H04123 Dry eye syndrome of bilateral lacrimal glands: Secondary | ICD-10-CM | POA: Diagnosis not present

## 2021-01-05 DIAGNOSIS — E0590 Thyrotoxicosis, unspecified without thyrotoxic crisis or storm: Secondary | ICD-10-CM | POA: Diagnosis not present

## 2021-01-10 ENCOUNTER — Encounter (INDEPENDENT_AMBULATORY_CARE_PROVIDER_SITE_OTHER): Payer: Medicare HMO | Admitting: Ophthalmology

## 2021-01-19 ENCOUNTER — Ambulatory Visit (INDEPENDENT_AMBULATORY_CARE_PROVIDER_SITE_OTHER): Payer: Medicare HMO | Admitting: Ophthalmology

## 2021-01-19 ENCOUNTER — Other Ambulatory Visit: Payer: Self-pay

## 2021-01-19 ENCOUNTER — Encounter (INDEPENDENT_AMBULATORY_CARE_PROVIDER_SITE_OTHER): Payer: Self-pay | Admitting: Ophthalmology

## 2021-01-19 DIAGNOSIS — E113293 Type 2 diabetes mellitus with mild nonproliferative diabetic retinopathy without macular edema, bilateral: Secondary | ICD-10-CM | POA: Diagnosis not present

## 2021-01-19 DIAGNOSIS — H353131 Nonexudative age-related macular degeneration, bilateral, early dry stage: Secondary | ICD-10-CM

## 2021-01-19 DIAGNOSIS — H401132 Primary open-angle glaucoma, bilateral, moderate stage: Secondary | ICD-10-CM | POA: Insufficient documentation

## 2021-01-19 DIAGNOSIS — H35363 Drusen (degenerative) of macula, bilateral: Secondary | ICD-10-CM

## 2021-01-19 NOTE — Assessment & Plan Note (Signed)

## 2021-01-19 NOTE — Progress Notes (Signed)
01/19/2021     CHIEF COMPLAINT Patient presents for  Chief Complaint  Patient presents with   Retina Follow Up      HISTORY OF PRESENT ILLNESS: Kayla Griffin is a 80 y.o. female who presents to the clinic today for:   HPI     Retina Follow Up   Patient presents with  Diabetic Retinopathy.  In both eyes.  This started 1 year ago.  Severity is mild.  Duration of 1 year.  Since onset it is stable.        Comments   1 year fu ou and oct Pt states VA OU stable since last visit. Pt denies FOL, floaters, or ocular pain OU.  Pt reports using Dorzolamide BID OU and Latanoprost QHS OU A1C:8.2 LBS:060       Last edited by Kendra Opitz, COA on 01/19/2021 10:46 AM.      Referring physician: Hortencia Pilar, MD [place],  [place] [address]  HISTORICAL INFORMATION:   Selected notes from the MEDICAL RECORD NUMBER    Lab Results  Component Value Date   HGBA1C 6.7 (H) 11/30/2020     CURRENT MEDICATIONS: Current Outpatient Medications (Ophthalmic Drugs)  Medication Sig   dorzolamide (TRUSOPT) 2 % ophthalmic solution Place 1 drop into both eyes 2 (two) times daily.    latanoprost (XALATAN) 0.005 % ophthalmic solution Place 1 drop into both eyes at bedtime.    No current facility-administered medications for this visit. (Ophthalmic Drugs)   Current Outpatient Medications (Other)  Medication Sig   alendronate (FOSAMAX) 70 MG tablet Take 70 mg by mouth every Sunday. Take with a full glass of water on an empty stomach.   apixaban (ELIQUIS) 5 MG TABS tablet Take 1 tablet (5 mg total) by mouth 2 (two) times daily.   atorvastatin (LIPITOR) 40 MG tablet Take 40 mg by mouth daily.    Calcium Carbonate-Vit D-Min (CALCIUM 1200 PO) Take 1,200 mg by mouth daily.   hydrochlorothiazide (HYDRODIURIL) 25 MG tablet Take 25 mg by mouth daily.    insulin NPH-regular Human (NOVOLIN 70/30) (70-30) 100 UNIT/ML injection Inject 15 Units into the skin 2 (two)  times daily with a meal.   metFORMIN (GLUCOPHAGE) 500 MG tablet Take 500 mg by mouth 2 (two) times daily with a meal.   methimazole (TAPAZOLE) 10 MG tablet Take 10 mg by mouth daily.   metoprolol (LOPRESSOR) 100 MG tablet Take 100 mg by mouth 2 (two) times daily.   ondansetron (ZOFRAN) 4 MG tablet Take 1 tablet (4 mg total) by mouth every 8 (eight) hours as needed for nausea or vomiting.   ramipril (ALTACE) 10 MG capsule Take 20 mg by mouth daily.    No current facility-administered medications for this visit. (Other)      REVIEW OF SYSTEMS:    ALLERGIES Allergies  Allergen Reactions   Actos [Pioglitazone] Swelling    SWELLING REACTION UNSPECIFIED    Cephalexin Nausea And Vomiting    PAST MEDICAL HISTORY Past Medical History:  Diagnosis Date   Ambulates with cane    Arthritis    Cancer ([place])    Endometrial - hysterectomy   Depression    Diabetes mellitus without complication ([place])    Type II   Dyspnea    currently with arm pain 08/22/19   Glaucoma    HLD (hyperlipidemia)    Hypertension    Wears dentures    Past Surgical History:  Procedure Laterality Date  ABDOMINAL HYSTERECTOMY     CERVICAL FUSION  2007   EYE SURGERY Bilateral    cataracts   I & D EXTREMITY Right 09/15/2019   Procedure: IRRIGATION AND DEBRIDEMENT ELBOW;  Surgeon: Leandrew Koyanagi, MD;  Location: [facility];  Service: Orthopedics;  Laterality: Right;   IRRIGATION AND DEBRIDEMENT ELBOW Right 12/11/2019   Procedure: RIGHT ELBOW IRRIGATION AND DEBRIDEMENT;  Surgeon: Leandrew Koyanagi, MD;  Location: [facility];  Service: Orthopedics;  Laterality: Right;   ORIF HUMERUS FRACTURE Right 08/25/2019   Procedure: OPEN REDUCTION INTERNAL FIXATION (ORIF) RIGHT SUPRACONDYLAR HUMERUS FRACTURE, NEUROLYSIS ULNAR NERVE;  Surgeon: Leandrew Koyanagi, MD;  Location: [facility];  Service: Orthopedics;  Laterality: Right;   ORIF PATELLA Right 07/07/2016   Procedure: Partial Patellectomy;  Surgeon: Leandrew Koyanagi, MD;  Location: [facility];  Service: Orthopedics;  Laterality: Right;    FAMILY HISTORY Family History  Problem Relation Age of Onset   CVA Mother    Heart disease Father 68   Heart attack Sister    Heart disease Sister    Arrhythmia Sister    CVA Sister     SOCIAL HISTORY Social History   Tobacco Use   Smoking status: Never   Smokeless tobacco: Never  Vaping Use   Vaping Use: Never used  Substance Use Topics   Alcohol use: No   Drug use: No         OPHTHALMIC EXAM:  Base Eye Exam     Visual Acuity (ETDRS)       Right Left   Dist [place] 20/40 20/50 -1   Dist ph  NI NI         Tonometry (Tonopen, 10:50 AM)       Right Left   Pressure 12 12         Pupils       Pupils Dark Light Shape React APD   Right PERRL 3 2 Round Brisk None   Left PERRL 3 2 Round Brisk None         Visual Fields (Counting fingers)       Left Right    Full Full         Extraocular Movement       Right Left    Full Full         Neuro/Psych     Oriented x3: Yes   Mood/Affect: Normal         Dilation     Both eyes: 1.0% Mydriacyl @ 10:49 AM           Slit Lamp and Fundus Exam     External Exam       Right Left   External Normal Normal         Slit Lamp Exam       Right Left   Lids/Lashes Normal Normal   Conjunctiva/Sclera White and quiet White and quiet   Cornea Clear Clear   Anterior Chamber Deep and quiet Deep and quiet   Iris Round and reactive Round and reactive   Lens Posterior chamber intraocular lens Posterior chamber intraocular lens   Anterior Vitreous Normal Normal         Fundus Exam       Right Left   Posterior Vitreous Posterior vitreous detachment Posterior vitreous detachment   Disc Normal Normal   C/D Ratio 0.75 0.8   Macula Hard drusen Hard drusen   Vessels no DR no DR   Lauralyn Primes  no tears, old hole treated it  no tears, old hole treated it            IMAGING AND PROCEDURES  Imaging and Procedures for 01/19/21  OCT, Retina  - OU - Both Eyes       Right Eye Quality was good. Scan locations included subfoveal. Central Foveal Thickness: 213. Findings include abnormal foveal contour, central retinal atrophy, outer retinal atrophy.   Left Eye Quality was good. Scan locations included subfoveal. Central Foveal Thickness: 220. Findings include abnormal foveal contour, central retinal atrophy, outer retinal atrophy.   Notes Diffuse macular atrophy particularly along the temporal arcades likely represents advanced open-angle glaucoma and loss of the retinal nerve fiber layer OU             ASSESSMENT/PLAN:  Early stage nonexudative age-related macular degeneration of both eyes The nature of age--related macular degeneration was discussed with the patient as well as the distinction between dry and wet types. Checking an Amsler Grid daily with advice to return immediately should a distortion develop, was given to the patient. The patient 's smoking status now and in the past was determined and advice based on the AREDS study was provided regarding the consumption of antioxidant supplements. AREDS 2 vitamin formulation was recommended. Consumption of dark leafy vegetables and fresh fruits of various colors was recommended. Treatment modalities for wet macular degeneration particularly the use of intravitreal injections of anti-blood vessel growth factors was discussed with the patient. Avastin, Lucentis, and Eylea are the available options. On occasion, therapy includes the use of photodynamic therapy and thermal laser. Stressed to the patient do not rub eyes.  Patient was advised to check Amsler Grid daily and return immediately if changes are noted. Instructions on using the grid were given to the patient. All patient questions were answered.  Nonproliferative diabetic retinopathy of both eyes ([place]) NO DR DETECTED ON EYE EXAM TODAY   Primary open-angle glaucoma, bilateral, moderate stage Under the care of Dr.  Marshall Cork follow-up as scheduled     ICD-10-CM   1. Nonproliferative diabetic retinopathy of both eyes (HCC)  E11.3293 OCT, Retina - OU - Both Eyes    2. Degenerative retinal drusen of both eyes  H35.363     3. Early stage nonexudative age-related macular degeneration of both eyes  H35.3131     4. Primary open-angle glaucoma, bilateral, moderate stage  H40.1132       1.  OU stable over time with no progression early ARMD  2.  OU no detectable diabetic retinopathy seen on examination today  3.  [facility] nerves[facility], follow-up with Dr. Marshall Cork as scheduled  Ophthalmic Meds Ordered this visit:  No orders of the defined types were placed in this encounter.      Return in about 1 year (around 01/19/2022) for DILATE OU, COLOR FP, OCT.  There are no Patient Instructions on file for this visit.   Explained the diagnoses, plan, and follow up with the patient and they expressed understanding.  Patient expressed understanding of the importance of proper follow up care.   Clent Demark Satia Winger M.D. Diseases & Surgery of the Retina and Vitreous [facility]ina [facility] Diabetic [place] 01/19/21     Abbreviations: M myopia (nearsighted); A astigmatism; H hyperopia (farsighted); P presbyopia; Mrx spectacle prescription;  CTL contact lenses; OD right eye; OS left eye; OU both eyes  XT exotropia; ET esotropia; PEK punctate epithelial keratitis; PEE punctate epithelial erosions; DES dry eye syndrome; MGD  meibomian gland dysfunction; ATs artificial tears; PFAT's preservative free artificial tears; [place] nuclear sclerotic cataract; PSC posterior subcapsular cataract; ERM epi-retinal membrane; PVD posterior vitreous detachment; RD retinal detachment; DM diabetes mellitus; DR diabetic retinopathy; NPDR non-proliferative diabetic retinopathy; PDR proliferative diabetic retinopathy; CSME clinically significant macular edema; DME diabetic macular edema; dbh dot blot hemorrhages; CWS cotton  wool spot; POAG primary open angle glaucoma; C/D cup-to-disc ratio; HVF humphrey visual field; GVF goldmann visual field; OCT optical coherence tomography; IOP intraocular pressure; BRVO Branch retinal vein occlusion; CRVO central retinal vein occlusion; CRAO central retinal artery occlusion; BRAO branch retinal artery occlusion; RT retinal tear; SB scleral buckle; PPV pars plana vitrectomy; VH Vitreous hemorrhage; PRP panretinal laser photocoagulation; IVK intravitreal kenalog; VMT vitreomacular traction; MH Macular hole;  NVD neovascularization of the disc; NVE neovascularization elsewhere; AREDS age related eye disease study; ARMD age related macular degeneration; POAG primary open angle glaucoma; EBMD epithelial/anterior basement membrane dystrophy; ACIOL anterior chamber intraocular lens; IOL intraocular lens; PCIOL posterior chamber intraocular lens; Phaco/IOL phacoemulsification with intraocular lens placement; [place] photorefractive keratectomy; LASIK laser assisted in situ keratomileusis; HTN hypertension; DM diabetes mellitus; COPD chronic obstructive pulmonary disease

## 2021-01-19 NOTE — Assessment & Plan Note (Signed)
Under the care of Dr. Marshall Cork follow-up as scheduled

## 2021-01-19 NOTE — Assessment & Plan Note (Signed)
NO DR DETECTED ON EYE EXAM TODAY

## 2021-01-23 ENCOUNTER — Other Ambulatory Visit: Payer: Self-pay | Admitting: Cardiovascular Disease

## 2021-01-23 DIAGNOSIS — I480 Paroxysmal atrial fibrillation: Secondary | ICD-10-CM

## 2021-01-24 DIAGNOSIS — E0590 Thyrotoxicosis, unspecified without thyrotoxic crisis or storm: Secondary | ICD-10-CM | POA: Diagnosis not present

## 2021-01-24 DIAGNOSIS — I10 Essential (primary) hypertension: Secondary | ICD-10-CM | POA: Diagnosis not present

## 2021-01-24 DIAGNOSIS — Z23 Encounter for immunization: Secondary | ICD-10-CM | POA: Diagnosis not present

## 2021-01-24 DIAGNOSIS — E11319 Type 2 diabetes mellitus with unspecified diabetic retinopathy without macular edema: Secondary | ICD-10-CM | POA: Diagnosis not present

## 2021-01-24 DIAGNOSIS — I4891 Unspecified atrial fibrillation: Secondary | ICD-10-CM | POA: Diagnosis not present

## 2021-01-24 DIAGNOSIS — E639 Nutritional deficiency, unspecified: Secondary | ICD-10-CM | POA: Diagnosis not present

## 2021-01-24 NOTE — Telephone Encounter (Signed)
Eliquis 5mg  refill request received. Patient is 80 years old, weight-73.9kg, Crea-0.62 on 12/02/2020, Diagnosis-Afib, and last seen by Dr. Marlou Porch on 08/24/2020. Dose is appropriate based on dosing criteria. Will send in refill to requested pharmacy.

## 2021-02-11 DIAGNOSIS — E0590 Thyrotoxicosis, unspecified without thyrotoxic crisis or storm: Secondary | ICD-10-CM | POA: Diagnosis not present

## 2021-02-21 ENCOUNTER — Other Ambulatory Visit: Payer: Self-pay

## 2021-02-21 ENCOUNTER — Encounter: Payer: Self-pay | Admitting: Cardiology

## 2021-02-21 ENCOUNTER — Ambulatory Visit: Payer: Medicare HMO | Admitting: Cardiology

## 2021-02-21 DIAGNOSIS — Z7901 Long term (current) use of anticoagulants: Secondary | ICD-10-CM | POA: Diagnosis not present

## 2021-02-21 DIAGNOSIS — E119 Type 2 diabetes mellitus without complications: Secondary | ICD-10-CM | POA: Diagnosis not present

## 2021-02-21 DIAGNOSIS — I4811 Longstanding persistent atrial fibrillation: Secondary | ICD-10-CM

## 2021-02-21 DIAGNOSIS — E0590 Thyrotoxicosis, unspecified without thyrotoxic crisis or storm: Secondary | ICD-10-CM

## 2021-02-21 DIAGNOSIS — I10 Essential (primary) hypertension: Secondary | ICD-10-CM | POA: Insufficient documentation

## 2021-02-21 DIAGNOSIS — E039 Hypothyroidism, unspecified: Secondary | ICD-10-CM | POA: Insufficient documentation

## 2021-02-21 NOTE — Progress Notes (Signed)
Cardiology Office Note:    Date:  02/21/2021   ID:  Kayla Griffin, DOB 1940/06/08, MRN [id number]  PCP:  Aretta Nip, MD   [facility] Providers Cardiologist:  Candee Furbish, MD     Referring MD: Aretta Nip, MD     History of Present Illness:    Kayla Griffin, Kayla Griffin, is a 80 y.o. female here for the follow-up of atrial fibrillation/flutter previously seen by Dr. Angelena Form on 07/07/2020, previously by me in May 2018.  Dr. Radene Ou referred her back over here at that time.  She was started on Eliquis 5 mg once a day.  Since her last visit, she was seen in the hospital on 11/29/20 for sepsis. Her temperature was 100.4, heart rate of 94 bpm, and blood pressure 242A-834H systolic. Her left hand/wrist swelling was felt to be cellulitis. Chest x-ray showed pulmonary congestion.   Today, she is doing fine. On 8/22, she was seen in the ED and stayed in the hospital for 4 days. She was bitten on 8/19 on her L hand which started swelling. The swelling worsened over the weekend. When she presented to the ED, the infection had spread up to her elbow. On her final stay, reportedly, her nurse told her she had erratic PVCs.  When she woke up the other day, she experienced palpitations that felt like her heart was racing.  She takes metoprolol.  She is compliant in taking her prescriptions. Her thyroid medicine was changed to every other day.  Methimazole, Dr. Buddy Duty  She denies any chest pain or shortness of breath. No lightheadedness, headaches, syncope, orthopnea, PND, lower extremity edema or exertional symptoms.  Past Medical History:  Diagnosis Date   Ambulates with cane    Arthritis    Cancer ([place])    Endometrial - hysterectomy   Depression    Diabetes mellitus without complication ([place])    Type II   Dyspnea    currently with arm pain 08/22/19   Glaucoma    HLD (hyperlipidemia)    Hypertension    Wears dentures     Past Surgical History:  Procedure Laterality  Date   ABDOMINAL HYSTERECTOMY     CERVICAL FUSION  2007   EYE SURGERY Bilateral    cataracts   I & D EXTREMITY Right 09/15/2019   Procedure: IRRIGATION AND DEBRIDEMENT ELBOW;  Surgeon: Leandrew Koyanagi, MD;  Location: [facility];  Service: Orthopedics;  Laterality: Right;   IRRIGATION AND DEBRIDEMENT ELBOW Right 12/11/2019   Procedure: RIGHT ELBOW IRRIGATION AND DEBRIDEMENT;  Surgeon: Leandrew Koyanagi, MD;  Location: [facility];  Service: Orthopedics;  Laterality: Right;   ORIF HUMERUS FRACTURE Right 08/25/2019   Procedure: OPEN REDUCTION INTERNAL FIXATION (ORIF) RIGHT SUPRACONDYLAR HUMERUS FRACTURE, NEUROLYSIS ULNAR NERVE;  Surgeon: Leandrew Koyanagi, MD;  Location: [facility];  Service: Orthopedics;  Laterality: Right;   ORIF PATELLA Right 07/07/2016   Procedure: Partial Patellectomy;  Surgeon: Leandrew Koyanagi, MD;  Location: [facility];  Service: Orthopedics;  Laterality: Right;    Current Medications: Current Meds  Medication Sig   alendronate (FOSAMAX) 70 MG tablet Take 70 mg by mouth every Sunday. Take with a full glass of water on an empty stomach.   apixaban (ELIQUIS) 5 MG TABS tablet Take 1 tablet by mouth twice daily   atorvastatin (LIPITOR) 40 MG tablet Take 40 mg by mouth daily.    Calcium Carbonate-Vit D-Min (CALCIUM 1200 PO) Take 1,200 mg by mouth daily.   dorzolamide (  TRUSOPT) 2 % ophthalmic solution Place 1 drop into both eyes 2 (two) times daily.    hydrochlorothiazide (HYDRODIURIL) 25 MG tablet Take 25 mg by mouth daily.    insulin NPH-regular Human (NOVOLIN 70/30) (70-30) 100 UNIT/ML injection Inject 15 Units into the skin 2 (two) times daily with a meal.   latanoprost (XALATAN) 0.005 % ophthalmic solution Place 1 drop into both eyes at bedtime.    metFORMIN (GLUCOPHAGE) 500 MG tablet Take 500 mg by mouth 2 (two) times daily with a meal.   methimazole (TAPAZOLE) 10 MG tablet Take 10 mg by mouth daily.   metoprolol (LOPRESSOR) 100 MG tablet Take 100 mg by mouth 2 (two) times daily.    ramipril (ALTACE) 10 MG capsule Take 20 mg by mouth daily.      Allergies:   Actos [pioglitazone] and Cephalexin   Social History   Socioeconomic History   Marital status: Single    Spouse name: Not on file   Number of children: 0   Years of education: Not on file   Highest education level: Not on file  Occupational History   Occupation: Retired from customer service  Tobacco Use   Smoking status: Never   Smokeless tobacco: Never  Vaping Use   Vaping Use: Never used  Substance and Sexual Activity   Alcohol use: No   Drug use: No   Sexual activity: Not Currently    Birth control/protection: Surgical    Comment: Hysterectomy  Other Topics Concern   Not on file  Social History Narrative   Not on file   Social Determinants of Health   Financial Resource Strain: Not on file  Food Insecurity: Not on file  Transportation Needs: Not on file  Physical Activity: Not on file  Stress: Not on file  Social Connections: Not on file     Family History: The patient's family history includes Arrhythmia in her sister; CVA in her mother and sister; Heart attack in her sister; Heart disease in her sister; Heart disease (age of onset: 37) in her father.  ROS:   Please see the history of present illness.    (+) Palpitations  All other systems reviewed and are negative.  EKGs/Labs/Other Studies Reviewed:    The following studies were reviewed today:  Echocardiogram 08/10/2020:  1. Left ventricular ejection fraction, by estimation, is 55 to 60%. The  left ventricle has normal function. The left ventricle has no regional  wall motion abnormalities. Left ventricular diastolic function could not  be evaluated.   2. Right ventricular systolic function is normal. The right ventricular  size is mildly enlarged. There is mildly elevated pulmonary artery  systolic pressure. The estimated right ventricular systolic pressure is  61.4 mmHg.   3. Left atrial size was mild to moderately  dilated.   4. Right atrial size was mild to moderately dilated.   5. The mitral valve is grossly normal. Trivial mitral valve  regurgitation. No evidence of mitral stenosis.   6. The aortic valve is tricuspid. Aortic valve regurgitation is not  visualized. No aortic stenosis is present.   7. The inferior vena cava is dilated in size with >50% respiratory  variability, suggesting right atrial pressure of 8 mmHg.  Comparison(s): Changes from prior study are noted. Afib is now present. No change in LV function.   EKG: EKG was not ordered today 07/07/2020: atrial fibrillation heart rate 81 bpm   Recent Labs: 11/29/2020: ALT 20 11/30/2020: Hemoglobin 11.8; Platelets 220 12/02/2020: BUN 14; Creatinine,  Ser 0.62; Potassium 3.3; Sodium 132  Recent Lipid Panel No results found for: CHOL, TRIG, HDL, CHOLHDL, VLDL, LDLCALC, LDLDIRECT   Risk Assessment/Calculations:      Physical Exam:    VS:  BP (!) 160/80 (BP Location: Left Arm, Patient Position: Sitting, Cuff Size: Normal)   Pulse 76   Ht 5\' 4"  (1.626 m)   Wt 170 lb (77.1 kg)   LMP  (LMP Unknown)   SpO2 96%   BMI 29.18 kg/m     Wt Readings from Last 3 Encounters:  02/21/21 170 lb (77.1 kg)  11/29/20 163 lb (73.9 kg)  08/24/20 170 lb (77.1 kg)     GEN:  Well nourished, well developed in no acute distress HEENT: Normal NECK: No JVD; No carotid bruits LYMPHATICS: No lymphadenopathy CARDIAC: Irregularly irregular normal rate, no murmurs, rubs, gallops RESPIRATORY:  Clear to auscultation without rales, wheezing or rhonchi  ABDOMEN: Soft, non-tender, non-distended MUSCULOSKELETAL:  No edema; No deformity  SKIN: Warm and dry NEUROLOGIC:  Alert and oriented x 3 PSYCHIATRIC:  Normal affect   ASSESSMENT:    1. Longstanding persistent atrial fibrillation ([place])   2. Chronic anticoagulation   3. Primary hypertension   4. Hyperthyroidism   5. Diabetes mellitus with coincident hypertension ([place])     PLAN:    Longstanding  persistent atrial fibrillation (HCC) Overall asymptomatic.  Occasionally she may feel her heart "race ".  No chest pain or shortness of breath.  She is on metoprolol 100 mg twice a day.  On Eliquis.  Chronic anticoagulation On Eliquis 5 mg twice a day.  Hemoglobin 11.5 creatinine 0.7.  No bleeding.  Doing well.  Continue with current medical management.  Continue with monitoring.  Hypertension Usually at home blood pressure is better.  Has a degree of whitecoat hypertension.  Dr. Radene Ou follows closely.  Excellent.  Hyperthyroidism Dr. Buddy Duty.  Methimazole has been decreased.  Excellent.  Diabetes mellitus with coincident hypertension (HCC) Dr. Buddy Duty has been monitoring.  Prior hemoglobin A1c 5.2.  Excellent.  Sepsis ([place]) Recent infection in the hospital hand insect bite.  Thankfully she is doing much better.   6 month follow up   Medication Adjustments/Labs and Tests Ordered: Current medicines are reviewed at length with the patient today.  Concerns regarding medicines are outlined above.  No orders of the defined types were placed in this encounter.  No orders of the defined types were placed in this encounter.   Patient Instructions  Medication Instructions:  The current medical regimen is effective;  continue present plan and medications.  *If you need a refill on your cardiac medications before your next appointment, please call your pharmacy*  Follow-Up: At [facility], you and your health needs are our priority.  As part of our continuing mission to provide you with exceptional heart care, we have created designated Provider Care Teams.  These Care Teams include your primary Cardiologist (physician) and Advanced Practice Providers (APPs -  Physician Assistants and Nurse Practitioners) who all work together to provide you with the care you need, when you need it.  We recommend signing up for the patient portal called "MyChart".  Sign up information is provided on this  After Visit Summary.  MyChart is used to connect with patients for Virtual Visits (Telemedicine).  Patients are able to view lab/test results, encounter notes, upcoming appointments, etc.  Non-urgent messages can be sent to your provider as well.   To learn more about what you can do with MyChart, go  to [email or web address].    Your next appointment:   6 month(s)  The format for your next appointment:   In Person  Provider:   Melina Copa, PA-C, Cecilie Kicks, NP, Ermalinda Barrios, PA-C, Christen Bame, NP, or Richardson Dopp, PA-C     Then, Candee Furbish, MD will plan to see you again in 1 year(s).   Thank you for choosing [facility]!!     I,Mykaella Javier,acting as a scribe for Candee Furbish, MD.,have documented all relevant documentation on the behalf of Candee Furbish, MD,as directed by  Candee Furbish, MD while in the presence of Candee Furbish, MD.  I, Candee Furbish, MD, have reviewed all documentation for this visit. The documentation on 02/21/21 for the exam, diagnosis, procedures, and orders are all accurate and complete.   Signed, Candee Furbish, MD  02/21/2021 10:45 AM    [facility]

## 2021-02-21 NOTE — Assessment & Plan Note (Signed)
Overall asymptomatic.  Occasionally she may feel her heart "race ".  No chest pain or shortness of breath.  She is on metoprolol 100 mg twice a day.  On Eliquis.

## 2021-02-21 NOTE — Assessment & Plan Note (Signed)
Dr. Buddy Duty has been monitoring.  Prior hemoglobin A1c 5.2.  Excellent.

## 2021-02-21 NOTE — Assessment & Plan Note (Signed)
Dr. Buddy Duty.  Methimazole has been decreased.  Excellent.

## 2021-02-21 NOTE — Assessment & Plan Note (Signed)
Usually at home blood pressure is better.  Has a degree of whitecoat hypertension.  Dr. Radene Ou follows closely.  Excellent.

## 2021-02-21 NOTE — Assessment & Plan Note (Signed)
Recent infection in the hospital hand insect bite.  Thankfully she is doing much better.

## 2021-02-21 NOTE — Patient Instructions (Signed)
Medication Instructions:  The current medical regimen is effective;  continue present plan and medications.  *If you need a refill on your cardiac medications before your next appointment, please call your pharmacy*  Follow-Up: At [facility], you and your health needs are our priority.  As part of our continuing mission to provide you with exceptional heart care, we have created designated Provider Care Teams.  These Care Teams include your primary Cardiologist (physician) and Advanced Practice Providers (APPs -  Physician Assistants and Nurse Practitioners) who all work together to provide you with the care you need, when you need it.  We recommend signing up for the patient portal called "MyChart".  Sign up information is provided on this After Visit Summary.  MyChart is used to connect with patients for Virtual Visits (Telemedicine).  Patients are able to view lab/test results, encounter notes, upcoming appointments, etc.  Non-urgent messages can be sent to your provider as well.   To learn more about what you can do with MyChart, go to [email or web address].    Your next appointment:   6 month(s)  The format for your next appointment:   In Person  Provider:   Melina Copa, PA-C, Cecilie Kicks, NP, Ermalinda Barrios, PA-C, Christen Bame, NP, or Richardson Dopp, PA-C     Then, Candee Furbish, MD will plan to see you again in 1 year(s).   Thank you for choosing [facility]!!

## 2021-02-21 NOTE — Assessment & Plan Note (Signed)
On Eliquis 5 mg twice a day.  Hemoglobin 11.5 creatinine 0.7.  No bleeding.  Doing well.  Continue with current medical management.  Continue with monitoring.

## 2021-03-17 DIAGNOSIS — E0500 Thyrotoxicosis with diffuse goiter without thyrotoxic crisis or storm: Secondary | ICD-10-CM | POA: Diagnosis not present

## 2021-03-17 DIAGNOSIS — D649 Anemia, unspecified: Secondary | ICD-10-CM | POA: Diagnosis not present

## 2021-03-17 DIAGNOSIS — E0590 Thyrotoxicosis, unspecified without thyrotoxic crisis or storm: Secondary | ICD-10-CM | POA: Diagnosis not present

## 2021-03-17 DIAGNOSIS — M810 Age-related osteoporosis without current pathological fracture: Secondary | ICD-10-CM | POA: Diagnosis not present

## 2021-03-17 DIAGNOSIS — I4891 Unspecified atrial fibrillation: Secondary | ICD-10-CM | POA: Diagnosis not present

## 2021-05-23 DIAGNOSIS — R059 Cough, unspecified: Secondary | ICD-10-CM | POA: Diagnosis not present

## 2021-05-23 DIAGNOSIS — E7800 Pure hypercholesterolemia, unspecified: Secondary | ICD-10-CM | POA: Diagnosis not present

## 2021-05-23 DIAGNOSIS — I4891 Unspecified atrial fibrillation: Secondary | ICD-10-CM | POA: Diagnosis not present

## 2021-05-23 DIAGNOSIS — E0590 Thyrotoxicosis, unspecified without thyrotoxic crisis or storm: Secondary | ICD-10-CM | POA: Diagnosis not present

## 2021-05-23 DIAGNOSIS — E11319 Type 2 diabetes mellitus with unspecified diabetic retinopathy without macular edema: Secondary | ICD-10-CM | POA: Diagnosis not present

## 2021-05-23 DIAGNOSIS — I10 Essential (primary) hypertension: Secondary | ICD-10-CM | POA: Diagnosis not present

## 2021-05-23 DIAGNOSIS — M810 Age-related osteoporosis without current pathological fracture: Secondary | ICD-10-CM | POA: Diagnosis not present

## 2021-05-30 DIAGNOSIS — Z780 Asymptomatic menopausal state: Secondary | ICD-10-CM | POA: Diagnosis not present

## 2021-05-30 DIAGNOSIS — M85851 Other specified disorders of bone density and structure, right thigh: Secondary | ICD-10-CM | POA: Diagnosis not present

## 2021-05-30 DIAGNOSIS — M810 Age-related osteoporosis without current pathological fracture: Secondary | ICD-10-CM | POA: Diagnosis not present

## 2021-06-14 DIAGNOSIS — R945 Abnormal results of liver function studies: Secondary | ICD-10-CM | POA: Diagnosis not present

## 2021-07-06 DIAGNOSIS — N8111 Cystocele, midline: Secondary | ICD-10-CM | POA: Diagnosis not present

## 2021-07-06 DIAGNOSIS — N952 Postmenopausal atrophic vaginitis: Secondary | ICD-10-CM | POA: Diagnosis not present

## 2021-07-06 DIAGNOSIS — Z4689 Encounter for fitting and adjustment of other specified devices: Secondary | ICD-10-CM | POA: Diagnosis not present

## 2021-07-31 ENCOUNTER — Other Ambulatory Visit: Payer: Self-pay | Admitting: Cardiology

## 2021-07-31 DIAGNOSIS — I480 Paroxysmal atrial fibrillation: Secondary | ICD-10-CM

## 2021-08-01 NOTE — Telephone Encounter (Signed)
Eliquis '5mg'$  refill request received. Patient is 81 years old, weight-77.1kg, Crea-0.62 on 12/02/2020, Diagnosis-Afib, and last seen by Dr. Marlou Porch on 02/21/2021. Dose is appropriate based on dosing criteria. Will send in refill to requested pharmacy.   ?

## 2021-08-16 DIAGNOSIS — E785 Hyperlipidemia, unspecified: Secondary | ICD-10-CM | POA: Insufficient documentation

## 2021-08-16 NOTE — Progress Notes (Signed)
?Cardiology Office Note:   ? ?Date:  08/17/2021  ? ?ID:  Kayla Griffin, DOB 08/12/1940, MRN [id number] ? ?PCP:  [facility] [place]  ?[facility] Providers ?Cardiologist:  Candee Furbish, MD    ?Referring MD: Aretta Nip, MD  ? ?Chief Complaint:  Follow-up for atrial fibrillation ?  ? ?Patient Profile: ?Persistent Atrial fibrillation/flutter ?Diabetes mellitus  ?Hypertension  ?Hyperlipidemia  ?Hyperthyroidism  ? ?Prior CV Studies: ?ECHO COMPLETE WO IMAGING ENHANCING AGENT 08/10/2020 ?EF 55-60 no RWMA, normal RVSF, mildly elevated PASP, RVSP 39.4, mild to moderate BAE, trivial MR ?   ? ?History of Present Illness:   ?Kayla Griffin is a 81 y.o. female with the above problem list.  She was last seen by Dr. Marlou Porch in 02/2021.  She returns for f/u.  She is here alone.  Overall, she has been doing well.  She ambulates with a walker.  She does note occasional episodes of hematuria.  She has had previous bladder surgery and is followed closely by urologist.  She has not had any significant bleeding episodes.  This has been ongoing for quite some time.  She has not had chest pain, orthopnea, significant leg edema.  She does have dyspnea with certain activities that she relates more to chronic arthritis in her knees.     ?   ?Past Medical History:  ?Diagnosis Date  ? Ambulates with cane   ? Arthritis   ? Cancer [facility])   ? Endometrial - hysterectomy  ? Depression   ? Diabetes mellitus without complication ([place])   ? Type II  ? Dyspnea   ? currently with arm pain 08/22/19  ? Glaucoma   ? HLD (hyperlipidemia)   ? Hypertension   ? Wears dentures   ? ?Current Medications: ?Current Meds  ?Medication Sig  ? alendronate (FOSAMAX) 70 MG tablet Take 70 mg by mouth every Sunday. Take with a full glass of water on an empty stomach.  ? apixaban (ELIQUIS) 5 MG TABS tablet Take 1 tablet by mouth twice daily  ? atorvastatin (LIPITOR) 40 MG tablet Take 40 mg by mouth daily.   ? Calcium Carbonate-Vit D-Min  (CALCIUM 1200 PO) Take 1,200 mg by mouth daily.  ? dorzolamide (TRUSOPT) 2 % ophthalmic solution Place 1 drop into both eyes 2 (two) times daily.   ? hydrochlorothiazide (HYDRODIURIL) 25 MG tablet Take 25 mg by mouth daily.   ? insulin NPH-regular Human (NOVOLIN 70/30) (70-30) 100 UNIT/ML injection Inject 15 Units into the skin 2 (two) times daily with a meal.  ? latanoprost (XALATAN) 0.005 % ophthalmic solution Place 1 drop into both eyes at bedtime.   ? metFORMIN (GLUCOPHAGE) 500 MG tablet Take 500 mg by mouth 2 (two) times daily with a meal.  ? methimazole (TAPAZOLE) 10 MG tablet Take 10 mg by mouth daily.  ? metoprolol (LOPRESSOR) 100 MG tablet Take 100 mg by mouth 2 (two) times daily.  ? ramipril (ALTACE) 10 MG capsule Take 20 mg by mouth daily.   ?  ?Allergies:   Cephalexin and Pioglitazone  ? ?Social History  ? ?Tobacco Use  ? Smoking status: Never  ? Smokeless tobacco: Never  ?Vaping Use  ? Vaping Use: Never used  ?Substance Use Topics  ? Alcohol use: No  ? Drug use: No  ?  ?Family Hx: ?The patient's family history includes Arrhythmia in her sister; CVA in her mother and sister; Heart attack in her sister; Heart disease in her sister; Heart disease (age  of onset: 33) in her father. ? ?Review of Systems  ?Gastrointestinal:  Negative for hematochezia and melena.  ?Genitourinary:  Positive for hematuria.   ? ?EKGs/Labs/Other Test Reviewed:   ? ?EKG:  EKG is not ordered today.  The ekg ordered today demonstrates n/a ? ?Recent Labs: ?11/29/2020: ALT 20 ?11/30/2020: Hemoglobin 11.8; Platelets 220 ?12/02/2020: BUN 14; Creatinine, Ser 0.62; Potassium 3.3; Sodium 132  ? ?Recent Lipid Panel ?No results for input(s): CHOL, TRIG, HDL, VLDL, LDLCALC, LDLDIRECT in the last 8760 hours.  ? ?Risk Assessment/Calculations:   ? ?CHA2DS2-VASc Score = 5  ? This indicates a 7.2% annual risk of stroke. ?The patient's score is based upon: ?CHF History: 0 ?HTN History: 1 ?Diabetes History: 1 ?Stroke History: 0 ?Vascular Disease History:  0 ?Age Score: 2 ?Gender Score: 1 ?  ? ?    ?Physical Exam:   ? ?VS:  BP (!) 150/80   Pulse 64   Ht '5\' 4"'$  (1.626 m)   Wt 179 lb 6.4 oz (81.4 kg)   LMP  (LMP Unknown)   SpO2 95%   BMI 30.79 kg/m?    ?Vitals:  ? 08/17/21 1021 08/17/21 1101  ?BP: (!) 152/82 (!) 150/80  ?  ? ?Wt Readings from Last 3 Encounters:  ?08/17/21 179 lb 6.4 oz (81.4 kg)  ?02/21/21 170 lb (77.1 kg)  ?11/29/20 163 lb (73.9 kg)  ?  ?Constitutional:   ?   Appearance: Healthy appearance. Not in distress.  ?Neck:  ?   Vascular: No JVR. JVD normal.  ?Pulmonary:  ?   Effort: Pulmonary effort is normal.  ?   Breath sounds: No wheezing. No rales.  ?Cardiovascular:  ?   Normal rate. Irregularly irregular rhythm. Normal S1. Normal S2.   ?   Murmurs: There is no murmur.  ?Edema: ?   Peripheral edema absent.  ?Abdominal:  ?   Palpations: Abdomen is soft.  ?Skin: ?   General: Skin is warm and dry.  ?Neurological:  ?   Mental Status: Alert and oriented to person, place and time.  ?   Cranial Nerves: Cranial nerves are intact.  ?  ?    ?ASSESSMENT & PLAN:   ?Longstanding persistent atrial fibrillation ([place]) ?Rate appears to be well controlled.  She does have episodes of palpitations.  These are not frequent.  I did advise her to notify us if they become more frequent.  We can certainly have her wear a Zio patch monitor to make sure she has good heart rate control.  She is tolerating anticoagulation.  She does have occasional episodes of hematuria.  She is followed chronically by urologist.  This has been evaluated in the past.  Her weight is >60 kg and her creatinine has been <1.5.  She remains on Eliquis 5 mg twice daily and metoprolol tartrate 100 mg twice daily.  Obtain follow-up BMET, CBC today.  Follow-up in 6 months. ? ?Essential hypertension ?Blood pressure is above target.  Repeat blood pressure by me 150/80.  She notes that her blood pressures are always higher in clinic.  I have asked her to monitor her blood pressure for 2 weeks and mail the  readings for review.  Continue HCTZ 25 mg daily, metoprolol tartrate 100 mg twice daily, ramipril 20 mg daily. ? ?Secondary hypercoagulable state ([place]) ?CHA2DS2-VASc Score = 5 [CHF History: 0, HTN History: 1, Diabetes History: 1, Stroke History: 0, Vascular Disease History: 0, Age Score: 2, Gender Score: 1].  Therefore, the patient's annual risk of stroke  is 7.2 %.  Continue apixaban 5 mg twice daily.  Obtain BMET, CBC today. ?  ?     ?   ?Dispo:  Return in about 6 months (around 02/17/2022) for Routine Follow Up with Dr. Marlou Porch.  ? ?Medication Adjustments/Labs and Tests Ordered: ?Current medicines are reviewed at length with the patient today.  Concerns regarding medicines are outlined above.  ?Tests Ordered: ?Orders Placed This Encounter  ?Procedures  ? Basic metabolic panel  ? CBC  ? ?Medication Changes: ?No orders of the defined types were placed in this encounter. ? ?Signed, ?Richardson Dopp, PA-C  ?08/17/2021 11:11 AM    ?[facility] ?[address], [place], [place]  [address] ?Phone: [phone]; Fax: [phone]  ?

## 2021-08-17 ENCOUNTER — Ambulatory Visit: Payer: Medicare HMO | Admitting: Physician Assistant

## 2021-08-17 ENCOUNTER — Encounter: Payer: Self-pay | Admitting: Physician Assistant

## 2021-08-17 VITALS — BP 150/80 | HR 64 | Ht 64.0 in | Wt 179.4 lb

## 2021-08-17 DIAGNOSIS — E7800 Pure hypercholesterolemia, unspecified: Secondary | ICD-10-CM

## 2021-08-17 DIAGNOSIS — I10 Essential (primary) hypertension: Secondary | ICD-10-CM

## 2021-08-17 DIAGNOSIS — D6869 Other thrombophilia: Secondary | ICD-10-CM | POA: Diagnosis not present

## 2021-08-17 DIAGNOSIS — I4811 Longstanding persistent atrial fibrillation: Secondary | ICD-10-CM | POA: Diagnosis not present

## 2021-08-17 DIAGNOSIS — E0590 Thyrotoxicosis, unspecified without thyrotoxic crisis or storm: Secondary | ICD-10-CM

## 2021-08-17 LAB — CBC
Hematocrit: 38.9 % (ref 34.0–46.6)
Hemoglobin: 13.2 g/dL (ref 11.1–15.9)
MCH: 29.7 pg (ref 26.6–33.0)
MCHC: 33.9 g/dL (ref 31.5–35.7)
MCV: 87 fL (ref 79–97)
Platelets: 243 10*3/uL (ref 150–450)
RBC: 4.45 x10E6/uL (ref 3.77–5.28)
RDW: 13.8 % (ref 11.7–15.4)
WBC: 10.6 10*3/uL (ref 3.4–10.8)

## 2021-08-17 LAB — BASIC METABOLIC PANEL
BUN/Creatinine Ratio: 26 (ref 12–28)
BUN: 25 mg/dL (ref 8–27)
CO2: 24 mmol/L (ref 20–29)
Calcium: 10.3 mg/dL (ref 8.7–10.3)
Chloride: 98 mmol/L (ref 96–106)
Creatinine, Ser: 0.95 mg/dL (ref 0.57–1.00)
Glucose: 85 mg/dL (ref 70–99)
Potassium: 5 mmol/L (ref 3.5–5.2)
Sodium: 137 mmol/L (ref 134–144)
eGFR: 61 mL/min/{1.73_m2} (ref >59)

## 2021-08-17 NOTE — Assessment & Plan Note (Signed)
CHA2DS2-VASc Score = 5 [CHF History: 0, HTN History: 1, Diabetes History: 1, Stroke History: 0, Vascular Disease History: 0, Age Score: 2, Gender Score: 1].  Therefore, the patient's annual risk of stroke is 7.2 %.  Continue apixaban 5 mg twice daily.  Obtain BMET, CBC today. ?

## 2021-08-17 NOTE — Patient Instructions (Signed)
Medication Instructions:  ?Your physician recommends that you continue on your current medications as directed. Please refer to the Current Medication list given to you today. ? ?*If you need a refill on your cardiac medications before your next appointment, please call your pharmacy* ? ? ?Lab Work: ?TODAY:  BMET & CBC ? ?If you have labs (blood work) drawn today and your tests are completely normal, you will receive your results only by: ?MyChart Message (if you have MyChart) OR ?A paper copy in the mail ?If you have any lab test that is abnormal or we need to change your treatment, we will call you to review the results. ? ? ?Testing/Procedures: ?None ordered ? ? ?Follow-Up: ?At [facility], you and your health needs are our priority.  As part of our continuing mission to provide you with exceptional heart care, we have created designated Provider Care Teams.  These Care Teams include your primary Cardiologist (physician) and Advanced Practice Providers (APPs -  Physician Assistants and Nurse Practitioners) who all work together to provide you with the care you need, when you need it. ? ?We recommend signing up for the patient portal called "MyChart".  Sign up information is provided on this After Visit Summary.  MyChart is used to connect with patients for Virtual Visits (Telemedicine).  Patients are able to view lab/test results, encounter notes, upcoming appointments, etc.  Non-urgent messages can be sent to your provider as well.   ?To learn more about what you can do with MyChart, go to [email or web address].   ? ?Your next appointment:   ?6 month(s) ? ?The format for your next appointment:   ?In Person ? ?Provider:   ?Candee Furbish, MD   ? ? ?Other Instructions ?Your physician has requested that you regularly monitor and record your blood pressure readings at home. Please use the same machine at the same time of day to check your readings and record them to bring to your follow-up visit. ?  ?Please  monitor blood pressures and keep a log of your readings and call them into our office at [phone].  ?  ?Make sure to check 2 hours after your medications.  ?  ?AVOID these things for 30 minutes before checking your blood pressure: ?No Drinking caffeine. ?No Drinking alcohol. ?No Eating. ?No Smoking. ?No Exercising. ?  ?Five minutes before checking your blood pressure: ?Pee. ?Sit in a dining chair. Avoid sitting in a soft couch or armchair. ?Be quiet. Do not talk  ? ?Important Information About Sugar ? ? ? ? ?  ?

## 2021-08-17 NOTE — Assessment & Plan Note (Signed)
Rate appears to be well controlled.  She does have episodes of palpitations.  These are not frequent.  I did advise her to notify us if they become more frequent.  We can certainly have her wear a Zio patch monitor to make sure she has good heart rate control.  She is tolerating anticoagulation.  She does have occasional episodes of hematuria.  She is followed chronically by urologist.  This has been evaluated in the past.  Her weight is >60 kg and her creatinine has been <1.5.  She remains on Eliquis 5 mg twice daily and metoprolol tartrate 100 mg twice daily.  Obtain follow-up BMET, CBC today.  Follow-up in 6 months. ?

## 2021-08-17 NOTE — Assessment & Plan Note (Signed)
Blood pressure is above target.  Repeat blood pressure by me 150/80.  She notes that her blood pressures are always higher in clinic.  I have asked her to monitor her blood pressure for 2 weeks and mail the readings for review.  Continue HCTZ 25 mg daily, metoprolol tartrate 100 mg twice daily, ramipril 20 mg daily. ?

## 2021-08-18 ENCOUNTER — Telehealth: Payer: Self-pay | Admitting: Physician Assistant

## 2021-08-18 NOTE — Telephone Encounter (Signed)
Transferred to [facility] for lab results.  ?

## 2021-10-04 DIAGNOSIS — E0500 Thyrotoxicosis with diffuse goiter without thyrotoxic crisis or storm: Secondary | ICD-10-CM | POA: Diagnosis not present

## 2021-10-04 DIAGNOSIS — E0590 Thyrotoxicosis, unspecified without thyrotoxic crisis or storm: Secondary | ICD-10-CM | POA: Diagnosis not present

## 2021-10-04 DIAGNOSIS — D649 Anemia, unspecified: Secondary | ICD-10-CM | POA: Diagnosis not present

## 2021-10-04 DIAGNOSIS — I4891 Unspecified atrial fibrillation: Secondary | ICD-10-CM | POA: Diagnosis not present

## 2021-10-04 DIAGNOSIS — M810 Age-related osteoporosis without current pathological fracture: Secondary | ICD-10-CM | POA: Diagnosis not present

## 2021-11-23 IMAGING — CR DG CHEST 2V
2 series · 2 of 2 positions shown · non-contrast
Comparison: Remote radiograph 07/19/2005

CLINICAL DATA: Suspected sepsis.  Shortness of breath.

EXAM:
CHEST - 2 VIEW

[chest lat]
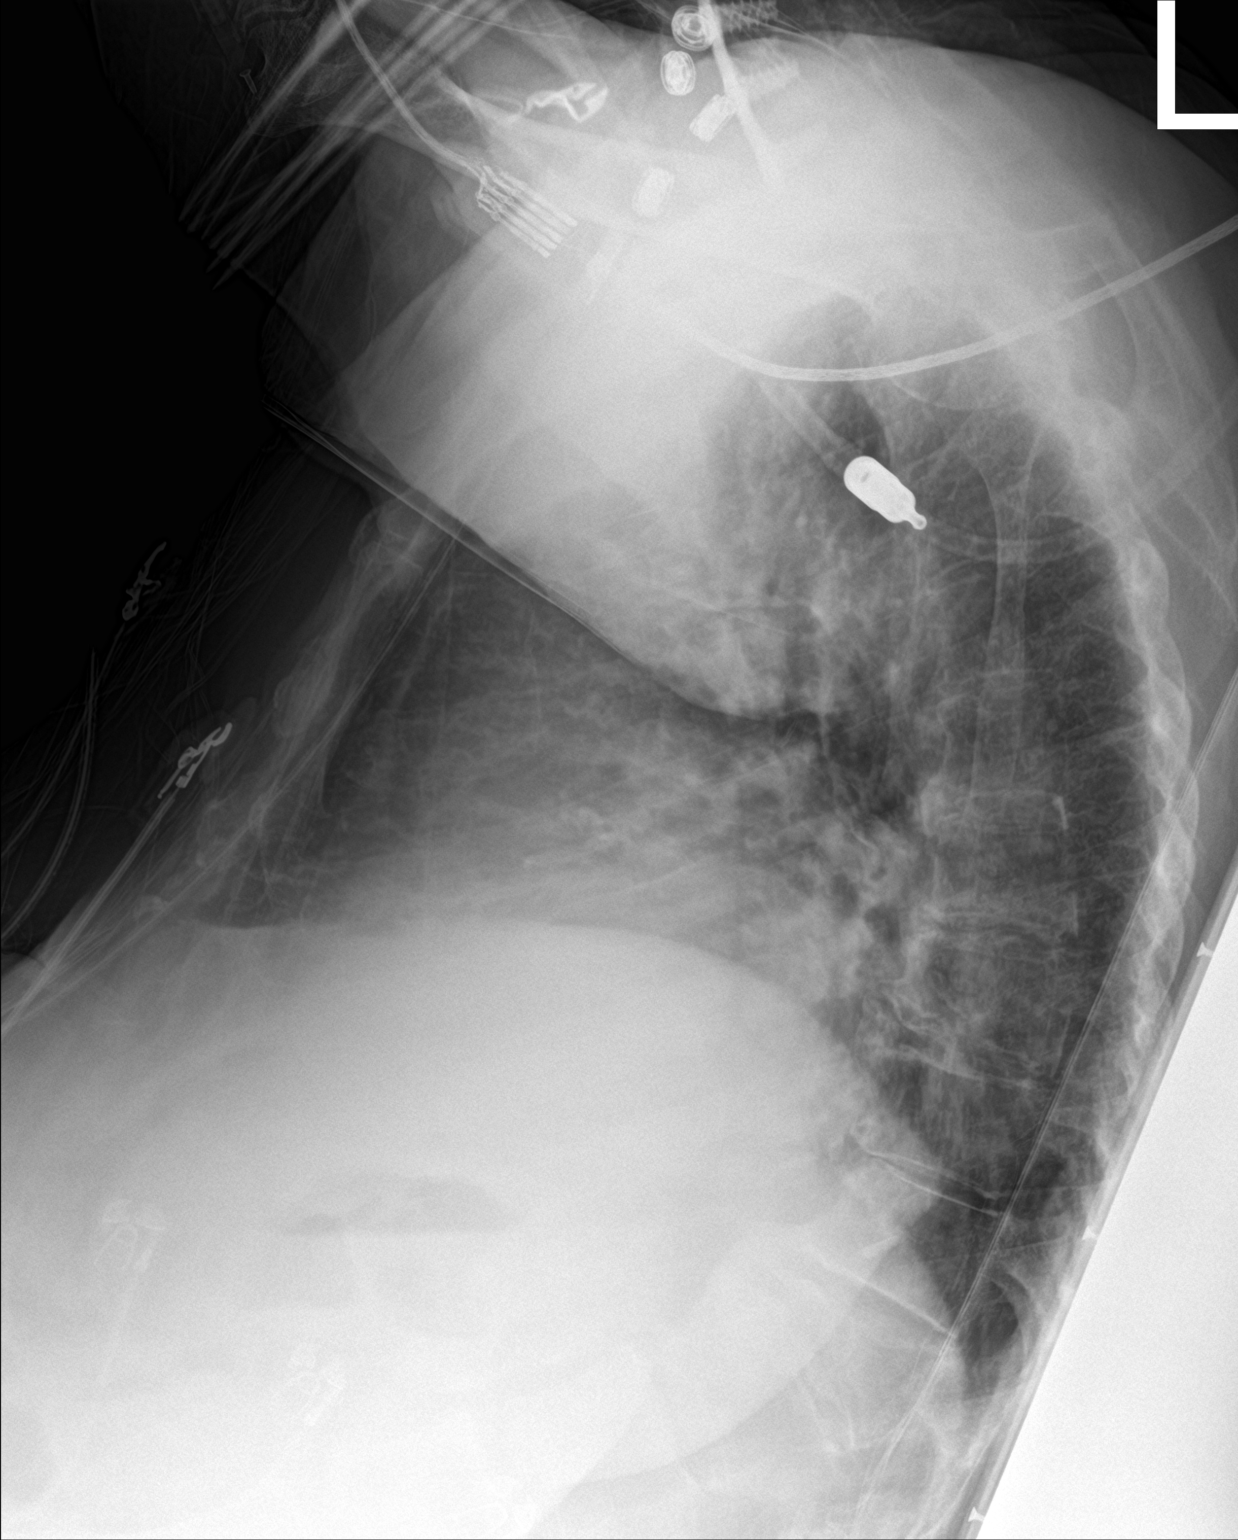

[chest ap]
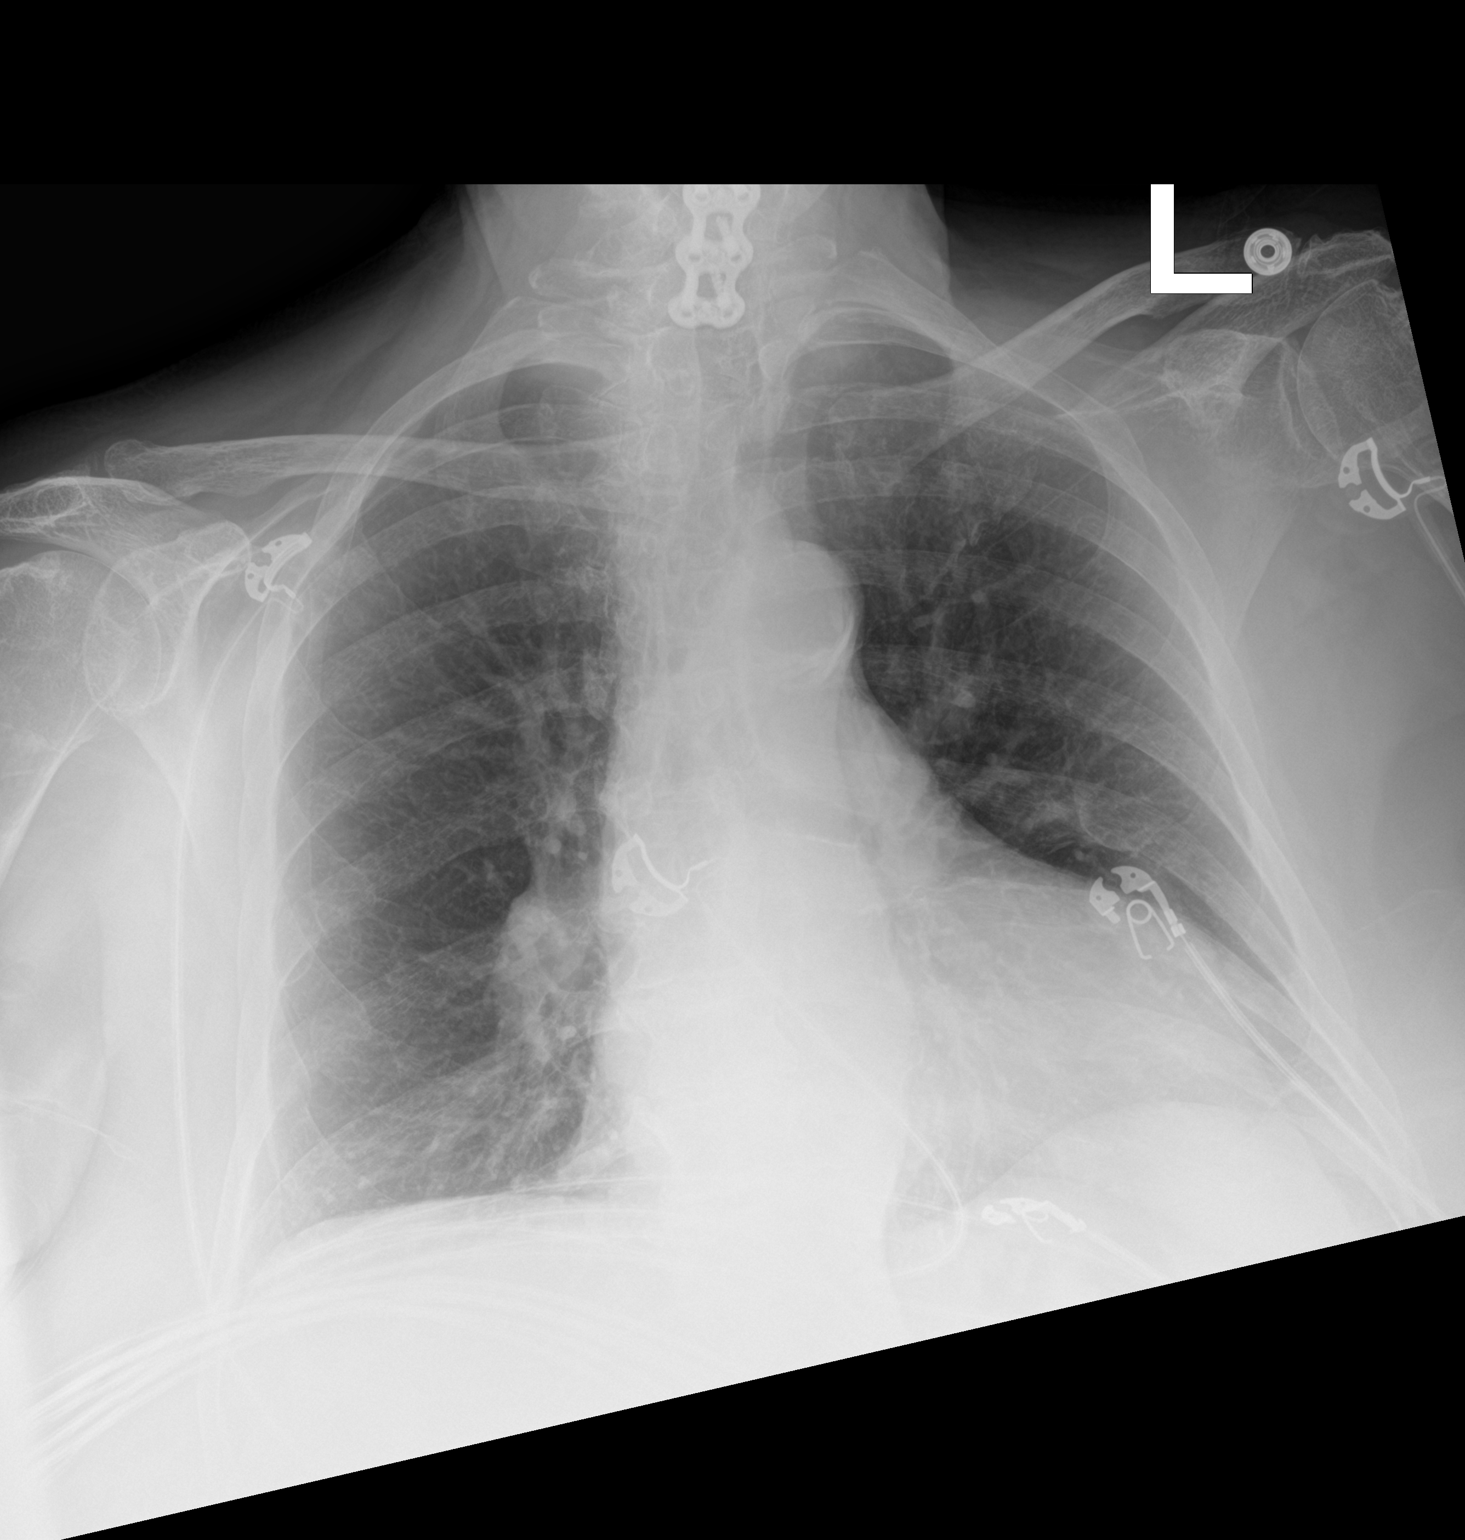

[2 of 2 positions shown; findings below may reference images not displayed]

FINDINGS: Mild cardiomegaly, stable. Unchanged mediastinal contours.
Atherosclerosis of the thoracic aorta. No confluent airspace
disease. No pleural fluid or pneumothorax. No pulmonary edema.
Surgical hardware in the lower cervical spine. Chronic compression
fracture in the lower thoracic spine, as seen on lumbar radiograph
02/17/2019.
IMPRESSION: 1. No acute chest findings.
2. Stable mild cardiomegaly.

Aortic Atherosclerosis ([id number]).

## 2021-12-08 DIAGNOSIS — H401232 Low-tension glaucoma, bilateral, moderate stage: Secondary | ICD-10-CM | POA: Diagnosis not present

## 2022-01-06 DIAGNOSIS — Z4689 Encounter for fitting and adjustment of other specified devices: Secondary | ICD-10-CM | POA: Diagnosis not present

## 2022-01-06 DIAGNOSIS — N8111 Cystocele, midline: Secondary | ICD-10-CM | POA: Diagnosis not present

## 2022-01-06 DIAGNOSIS — N952 Postmenopausal atrophic vaginitis: Secondary | ICD-10-CM | POA: Diagnosis not present

## 2022-01-19 DIAGNOSIS — E7800 Pure hypercholesterolemia, unspecified: Secondary | ICD-10-CM | POA: Diagnosis not present

## 2022-01-19 DIAGNOSIS — Z23 Encounter for immunization: Secondary | ICD-10-CM | POA: Diagnosis not present

## 2022-01-19 DIAGNOSIS — Z794 Long term (current) use of insulin: Secondary | ICD-10-CM | POA: Diagnosis not present

## 2022-01-19 DIAGNOSIS — E11319 Type 2 diabetes mellitus with unspecified diabetic retinopathy without macular edema: Secondary | ICD-10-CM | POA: Diagnosis not present

## 2022-01-19 DIAGNOSIS — I10 Essential (primary) hypertension: Secondary | ICD-10-CM | POA: Diagnosis not present

## 2022-01-25 ENCOUNTER — Encounter (INDEPENDENT_AMBULATORY_CARE_PROVIDER_SITE_OTHER): Payer: Medicare HMO | Admitting: Ophthalmology

## 2022-01-25 DIAGNOSIS — H401130 Primary open-angle glaucoma, bilateral, stage unspecified: Secondary | ICD-10-CM | POA: Diagnosis not present

## 2022-01-25 DIAGNOSIS — H353132 Nonexudative age-related macular degeneration, bilateral, intermediate dry stage: Secondary | ICD-10-CM | POA: Diagnosis not present

## 2022-01-30 ENCOUNTER — Other Ambulatory Visit: Payer: Self-pay | Admitting: Cardiology

## 2022-01-30 DIAGNOSIS — I480 Paroxysmal atrial fibrillation: Secondary | ICD-10-CM

## 2022-01-31 NOTE — Telephone Encounter (Signed)
Prescription refill request for Eliquis received. Indication:Afib Last office visit:5/23 Scr:0.9 Age: 81 Weight:81.4 kg  Prescription refilled

## 2022-03-06 ENCOUNTER — Encounter: Payer: Self-pay | Admitting: Cardiology

## 2022-03-06 ENCOUNTER — Ambulatory Visit: Payer: Medicare HMO | Attending: Cardiology | Admitting: Cardiology

## 2022-03-06 VITALS — BP 140/70 | HR 60 | Ht 64.0 in | Wt 188.4 lb

## 2022-03-06 DIAGNOSIS — I4891 Unspecified atrial fibrillation: Secondary | ICD-10-CM | POA: Diagnosis not present

## 2022-03-06 DIAGNOSIS — I10 Essential (primary) hypertension: Secondary | ICD-10-CM

## 2022-03-06 DIAGNOSIS — I4819 Other persistent atrial fibrillation: Secondary | ICD-10-CM

## 2022-03-06 MED ORDER — OLMESARTAN MEDOXOMIL 20 MG PO TABS: 20.0000 mg | ORAL_TABLET | Freq: Every day | ORAL | 3 refills | Status: DC

## 2022-03-06 NOTE — Progress Notes (Signed)
Cardiology Office Note:    Date:  03/06/2022   ID:  Kayla Griffin, DOB 1940/04/30, MRN [id number]  PCP:  [facility] [place] Prov[facility] Cardiologist:  Candee Furbish, MD     Referring MD: [facility]     History of Present Illness:    Kayla Griffin, Kayla Griffin, is a 81 y.o. female here for the follow-up of atrial fibrillation/flutter previously seen by Dr. Angelena Form on 07/07/2020, previously by me in May 2018.  Dr. Radene Ou referred her back over here at that time.  She was started on Eliquis 5 mg once a day.  She was seen in the hospital on 11/29/20 for sepsis. Her temperature was 100.4, heart rate of 94 bpm, and blood pressure [id number] systolic. Her left hand/wrist swelling was felt to be cellulitis. Chest x-ray showed pulmonary congestion.   At her last visit with me in 02/2021, she was doing fine. On 8/22, she was seen in the ED and stayed in the hospital for 4 days. She was bitten on 8/19 on her L hand which started swelling. When she presented to the ED, the infection had spread up to her elbow. On her final stay, reportedly, her nurse told her she had erratic PVCs. She also noted having racing heartbeat palpitations shortly after that hospitalization. She does take Metoprolol. Her thyroid medicine was changed to every other day.  Methimazole, Dr. Buddy Duty  Today, she states that she has been doing well overall. She notes occasional racing palpitations but mostly notices these at night when she is at rest. She is not bothered or limited by these.  She has had some difficulty managing her BP and has seen pressures into the 469G systolic. Her initial BP in clinic today was 144/74 and 144/70 on recheck. She was started on Amlodipine '5mg'$  daily in October and initially saw good improvement of her blood pressure to the 120s.  She has been on Ramipril '20mg'$  daily for many years. She is agreeable to d/c this and to start Olmesartan as long as it is  reasonably priced.  She reports occasional mild hematuria with pink discoloration of her urine. However, she is followed by urology for this. She denies any other bleeding problems on the Eliquis. She notes that she is currently in the insurance donut hole but would like to stay on Eliquis as it has done so well for her and she it return to $46 a month in January.  She has some balance issues with quick movements. She is using a walker to mitigate falls.  Past Medical History:  Diagnosis Date   Ambulates with cane    Arthritis    Cancer ([place])    Endometrial - hysterectomy   Depression    Diabetes mellitus without complication ([place])    Type II   Dyspnea    currently with arm pain 08/22/19   Glaucoma    HLD (hyperlipidemia)    Hypertension    Wears dentures     Past Surgical History:  Procedure Laterality Date   ABDOMINAL HYSTERECTOMY     CERVICAL FUSION  2007   EYE SURGERY Bilateral    cataracts   I & D EXTREMITY Right 09/15/2019   Procedure: IRRIGATION AND DEBRIDEMENT ELBOW;  Surgeon: Leandrew Koyanagi, MD;  Location: [facility];  Service: Orthopedics;  Laterality: Right;   IRRIGATION AND DEBRIDEMENT ELBOW Right 12/11/2019   Procedure: RIGHT ELBOW IRRIGATION AND DEBRIDEMENT;  Surgeon: Leandrew Koyanagi, MD;  Location: [place];  Service: Orthopedics;  Laterality: Right;   ORIF HUMERUS FRACTURE Right 08/25/2019   Procedure: OPEN REDUCTION INTERNAL FIXATION (ORIF) RIGHT SUPRACONDYLAR HUMERUS FRACTURE, NEUROLYSIS ULNAR NERVE;  Surgeon: Leandrew Koyanagi, MD;  Location: [facility];  Service: Orthopedics;  Laterality: Right;   ORIF PATELLA Right 07/07/2016   Procedure: Partial Patellectomy;  Surgeon: Leandrew Koyanagi, MD;  Location: [place] [facility];  Service: Orthopedics;  Laterality: Right;    Current Medications: Current Meds  Medication Sig   alendronate (FOSAMAX) 70 MG tablet Take 70 mg by mouth every Sunday. Take with a full glass of water on an empty stomach.   amLODipine (NORVASC) 5 MG tablet Take  5 mg by mouth daily.   apixaban (ELIQUIS) 5 MG TABS tablet Take 1 tablet by mouth twice daily   atorvastatin (LIPITOR) 40 MG tablet Take 40 mg by mouth daily.    Calcium Carbonate-Vit D-Min (CALCIUM 1200 PO) Take 1,200 mg by mouth daily.   dorzolamide (TRUSOPT) 2 % ophthalmic solution Place 1 drop into both eyes 2 (two) times daily.    hydrochlorothiazide (HYDRODIURIL) 25 MG tablet Take 25 mg by mouth daily.    insulin NPH-regular Human (NOVOLIN 70/30) (70-30) 100 UNIT/ML injection Inject 15 Units into the skin 2 (two) times daily with a meal.   latanoprost (XALATAN) 0.005 % ophthalmic solution Place 1 drop into both eyes at bedtime.    metFORMIN (GLUCOPHAGE) 500 MG tablet Take 500 mg by mouth 2 (two) times daily with a meal.   methimazole (TAPAZOLE) 10 MG tablet Take 10 mg by mouth daily.   metoprolol (LOPRESSOR) 100 MG tablet Take 100 mg by mouth 2 (two) times daily.   olmesartan (BENICAR) 20 MG tablet Take 1 tablet (20 mg total) by mouth daily.   [DISCONTINUED] ramipril (ALTACE) 10 MG capsule Take 20 mg by mouth daily.      Allergies:   Cephalexin and Pioglitazone   Social History   Socioeconomic History   Marital status: Single    Spouse name: Not on file   Number of children: 0   Years of education: Not on file   Highest education level: Not on file  Occupational History   Occupation: Retired from customer service  Tobacco Use   Smoking status: Never   Smokeless tobacco: Never  Vaping Use   Vaping Use: Never used  Substance and Sexual Activity   Alcohol use: No   Drug use: No   Sexual activity: Not Currently    Birth control/protection: Surgical    Comment: Hysterectomy  Other Topics Concern   Not on file  Social History Narrative   Not on file   Social Determinants of Health   Financial Resource Strain: Not on file  Food Insecurity: Not on file  Transportation Needs: Not on file  Physical Activity: Not on file  Stress: Not on file  Social Connections: Not on  file     Family History: The patient's family history includes Arrhythmia in her sister; CVA in her mother and sister; Heart attack in her sister; Heart disease in her sister; Heart disease (age of onset: 45) in her father.  ROS:   Please see the history of present illness.    (+) rare palpitations  All other systems reviewed and are negative.  EKGs/Labs/Other Studies Reviewed:    The following studies were reviewed today:  Echocardiogram 08/10/2020:  1. Left ventricular ejection fraction, by estimation, is 55 to 60%. The  left ventricle has normal function.  The left ventricle has no regional  wall motion abnormalities. Left ventricular diastolic function could not  be evaluated.   2. Right ventricular systolic function is normal. The right ventricular  size is mildly enlarged. There is mildly elevated pulmonary artery  systolic pressure. The estimated right ventricular systolic pressure is  24.0 mmHg.   3. Left atrial size was mild to moderately dilated.   4. Right atrial size was mild to moderately dilated.   5. The mitral valve is grossly normal. Trivial mitral valve  regurgitation. No evidence of mitral stenosis.   6. The aortic valve is tricuspid. Aortic valve regurgitation is not  visualized. No aortic stenosis is present.   7. The inferior vena cava is dilated in size with >50% respiratory  variability, suggesting right atrial pressure of 8 mmHg.  Comparison(s): Changes from prior study are noted. Afib is now present. No change in LV function.   EKG: EKG ordered today, 03/06/22.  The ekg ordered today demonstrates sinus rhythm with a rate of 60bpm, left axis deviation. 07/07/2020: atrial fibrillation heart rate 81 bpm   Recent Labs: 08/17/2021: BUN 25; Creatinine, Ser 0.95; Hemoglobin 13.2; Platelets 243; Potassium 5.0; Sodium 137  Recent Lipid Panel No results found for: "CHOL", "TRIG", "HDL", "CHOLHDL", "VLDL", "LDLCALC", "LDLDIRECT"   Risk Assessment/Calculations:       Physical Exam:    VS:  BP (!) 140/70 (BP Location: Left Arm, Patient Position: Sitting)   Pulse 60   Ht '5\' 4"'$  (1.626 m)   Wt 188 lb 6.4 oz (85.5 kg)   LMP  (LMP Unknown)   SpO2 98%   BMI 32.34 kg/m     Wt Readings from Last 3 Encounters:  03/06/22 188 lb 6.4 oz (85.5 kg)  08/17/21 179 lb 6.4 oz (81.4 kg)  02/21/21 170 lb (77.1 kg)     GEN:  Well nourished, well developed in no acute distress. Uses a walker. HEENT: Normal NECK: No JVD; No carotid bruits LYMPHATICS: No lymphadenopathy CARDIAC: Irregularly irregular normal rate, no murmurs, rubs, gallops RESPIRATORY:  Clear to auscultation without rales, wheezing or rhonchi  ABDOMEN: Soft, non-tender, non-distended MUSCULOSKELETAL:  No edema; No deformity  SKIN: Warm and dry NEUROLOGIC:  Alert and oriented x 3 PSYCHIATRIC:  Normal affect   ASSESSMENT:    1. Persistent atrial fibrillation ([place])   2. Essential hypertension   3. Atrial fibrillation, unspecified type ([place])      PLAN:     Persistent atrial fibrillation ([place]) She is currently in sinus rhythm.  Excellent.  Heart rate is 60.  Overall asymptomatic.  Occasionally she may feel her heart "race ".  No chest pain or shortness of breath.  She is on metoprolol 100 mg twice a day.  Heart rate is 60.  On Eliquis. No bleeding. Uses a walker.    Chronic anticoagulation On Eliquis 5 mg twice a day.  Hemoglobin 12.9 creatinine 0.8.  No bleeding.  Doing well.  Continue with current medical management.  Continue with monitoring.  Her sister takes Coumadin and this is quite a burden for her.   Hypertension Usually at home blood pressure is better.  Has a degree of whitecoat hypertension.  Dr. Radene Ou follows closely.  Excellent.  I will go ahead and change her off of the ramipril, hold her ACE inhibitor and place her on olmesartan 20 mg a day.  If blood pressure still remain high, she can always increase to 40 mg daily.  This is a generic medication.  Hyperthyroidism Dr. Buddy Duty.  Methimazole has been decreased.  She is now on every other day excellent.   Diabetes mellitus with coincident hypertension (HCC) Dr. Buddy Duty has been monitoring.  Prior hemoglobin A1c 6.8.  Excellent.   Follow up: 6 months with APP 1 year with me   Medication Adjustments/Labs and Tests Ordered: Current medicines are reviewed at length with the patient today.  Concerns regarding medicines are outlined above.  Orders Placed This Encounter  Procedures   EKG 12-Lead    Meds ordered this encounter  Medications   olmesartan (BENICAR) 20 MG tablet    Sig: Take 1 tablet (20 mg total) by mouth daily.    Dispense:  90 tablet    Refill:  3     Patient Instructions  Medication Instructions:  Stop taking ramipril/Altace. Start taking olmesartan 20 mg daily.  *If you need a refill on your cardiac medications before your next appointment, please call your pharmacy*   Lab Work: None. If you have labs (blood work) drawn today and your tests are completely normal, you will receive your results only by: [place] (if you have MyChart) OR A paper copy in the mail If you have any lab test that is abnormal or we need to change your treatment, we will call you to review the results.   Testing/Procedures: None.   Follow-Up: At [facility], you and your health needs are our priority.  As part of our continuing mission to provide you with exceptional heart care, we have created designated Provider Care Teams.  These Care Teams include your primary Cardiologist (physician) and Advanced Practice Providers (APPs -  Physician Assistants and Nurse Practitioners) who all work together to provide you with the care you need, when you need it.  We recommend signing up for the patient portal called "MyChart".  Sign up information is provided on this After Visit Summary.  MyChart is used to connect with patients for Virtual Visits (Telemedicine).  Patients are able  to view lab/test results, encounter notes, upcoming appointments, etc.  Non-urgent messages can be sent to your provider as well.   To learn more about what you can do with MyChart, go to [email or web address].    Your next appointment:   6 month(s)  The format for your next appointment:   In Person  Provider:   Richardson Dopp, PA-C     Then, Candee Furbish, MD will plan to see you again in 1 year(s).    Important Information About Sugar          Signed, Candee Furbish, MD  03/06/2022 10:29 AM    Kayla Griffin as a scribe for Candee Furbish, MD.,have documented all relevant documentation on the behalf of Candee Furbish, MD,as directed by  Candee Furbish, MD while in the presence of Candee Furbish, MD.  I, Candee Furbish, MD, have reviewed all documentation for this visit. The documentation on 03/06/22 for the exam, diagnosis, procedures, and orders are all accurate and complete.

## 2022-03-06 NOTE — Patient Instructions (Signed)
Medication Instructions:  Stop taking ramipril/Altace. Start taking olmesartan 20 mg daily.  *If you need a refill on your cardiac medications before your next appointment, please call your pharmacy*   Lab Work: None. If you have labs (blood work) drawn today and your tests are completely normal, you will receive your results only by: [place] (if you have MyChart) OR A paper copy in the mail If you have any lab test that is abnormal or we need to change your treatment, we will call you to review the results.   Testing/Procedures: None.   Follow-Up: At [facility], you and your health needs are our priority.  As part of our continuing mission to provide you with exceptional heart care, we have created designated Provider Care Teams.  These Care Teams include your primary Cardiologist (physician) and Advanced Practice Providers (APPs -  Physician Assistants and Nurse Practitioners) who all work together to provide you with the care you need, when you need it.  We recommend signing up for the patient portal called "MyChart".  Sign up information is provided on this After Visit Summary.  MyChart is used to connect with patients for Virtual Visits (Telemedicine).  Patients are able to view lab/test results, encounter notes, upcoming appointments, etc.  Non-urgent messages can be sent to your provider as well.   To learn more about what you can do with MyChart, go to [email or web address].    Your next appointment:   6 month(s)  The format for your next appointment:   In Person  Provider:   Richardson Dopp, PA-C     Then, Candee Furbish, MD will plan to see you again in 1 year(s).    Important Information About Sugar

## 2022-04-06 DIAGNOSIS — E0500 Thyrotoxicosis with diffuse goiter without thyrotoxic crisis or storm: Secondary | ICD-10-CM | POA: Diagnosis not present

## 2022-04-06 DIAGNOSIS — M810 Age-related osteoporosis without current pathological fracture: Secondary | ICD-10-CM | POA: Diagnosis not present

## 2022-04-06 DIAGNOSIS — E0590 Thyrotoxicosis, unspecified without thyrotoxic crisis or storm: Secondary | ICD-10-CM | POA: Diagnosis not present

## 2022-04-12 DIAGNOSIS — E0500 Thyrotoxicosis with diffuse goiter without thyrotoxic crisis or storm: Secondary | ICD-10-CM | POA: Diagnosis not present

## 2022-04-12 DIAGNOSIS — M810 Age-related osteoporosis without current pathological fracture: Secondary | ICD-10-CM | POA: Diagnosis not present

## 2022-04-12 DIAGNOSIS — E0590 Thyrotoxicosis, unspecified without thyrotoxic crisis or storm: Secondary | ICD-10-CM | POA: Diagnosis not present

## 2022-04-12 DIAGNOSIS — E113293 Type 2 diabetes mellitus with mild nonproliferative diabetic retinopathy without macular edema, bilateral: Secondary | ICD-10-CM | POA: Diagnosis not present

## 2022-04-12 DIAGNOSIS — I10 Essential (primary) hypertension: Secondary | ICD-10-CM | POA: Diagnosis not present

## 2022-04-12 DIAGNOSIS — E7800 Pure hypercholesterolemia, unspecified: Secondary | ICD-10-CM | POA: Diagnosis not present

## 2022-04-12 DIAGNOSIS — I4891 Unspecified atrial fibrillation: Secondary | ICD-10-CM | POA: Diagnosis not present

## 2022-04-20 DIAGNOSIS — I10 Essential (primary) hypertension: Secondary | ICD-10-CM | POA: Diagnosis not present

## 2022-05-11 DIAGNOSIS — Z961 Presence of intraocular lens: Secondary | ICD-10-CM | POA: Diagnosis not present

## 2022-05-11 DIAGNOSIS — H524 Presbyopia: Secondary | ICD-10-CM | POA: Diagnosis not present

## 2022-05-11 DIAGNOSIS — E119 Type 2 diabetes mellitus without complications: Secondary | ICD-10-CM | POA: Diagnosis not present

## 2022-05-11 DIAGNOSIS — H401132 Primary open-angle glaucoma, bilateral, moderate stage: Secondary | ICD-10-CM | POA: Diagnosis not present

## 2022-05-11 DIAGNOSIS — H353132 Nonexudative age-related macular degeneration, bilateral, intermediate dry stage: Secondary | ICD-10-CM | POA: Diagnosis not present

## 2022-05-15 DIAGNOSIS — H524 Presbyopia: Secondary | ICD-10-CM | POA: Diagnosis not present

## 2022-05-15 DIAGNOSIS — H52209 Unspecified astigmatism, unspecified eye: Secondary | ICD-10-CM | POA: Diagnosis not present

## 2022-05-15 DIAGNOSIS — H5213 Myopia, bilateral: Secondary | ICD-10-CM | POA: Diagnosis not present

## 2022-07-21 ENCOUNTER — Other Ambulatory Visit: Payer: Self-pay | Admitting: Cardiology

## 2022-07-21 DIAGNOSIS — I480 Paroxysmal atrial fibrillation: Secondary | ICD-10-CM

## 2022-07-21 NOTE — Telephone Encounter (Signed)
Prescription refill request for Eliquis received. Indication: Afib  Last office visit: 03/06/22 Anne Fu)  Scr: 0.95 (08/17/21)  Age: 82 Weight: 85.5kg  Appropriate dose. Refill sent.

## 2022-07-24 DIAGNOSIS — H353132 Nonexudative age-related macular degeneration, bilateral, intermediate dry stage: Secondary | ICD-10-CM | POA: Diagnosis not present

## 2022-07-24 DIAGNOSIS — E113293 Type 2 diabetes mellitus with mild nonproliferative diabetic retinopathy without macular edema, bilateral: Secondary | ICD-10-CM | POA: Diagnosis not present

## 2022-07-24 DIAGNOSIS — H33331 Multiple defects of retina without detachment, right eye: Secondary | ICD-10-CM | POA: Diagnosis not present

## 2022-07-24 DIAGNOSIS — H43813 Vitreous degeneration, bilateral: Secondary | ICD-10-CM | POA: Diagnosis not present

## 2022-08-03 DIAGNOSIS — Z4689 Encounter for fitting and adjustment of other specified devices: Secondary | ICD-10-CM | POA: Diagnosis not present

## 2022-08-16 DIAGNOSIS — H353132 Nonexudative age-related macular degeneration, bilateral, intermediate dry stage: Secondary | ICD-10-CM | POA: Diagnosis not present

## 2022-08-16 DIAGNOSIS — H401132 Primary open-angle glaucoma, bilateral, moderate stage: Secondary | ICD-10-CM | POA: Diagnosis not present

## 2022-09-04 NOTE — Progress Notes (Unsigned)
Cardiology Office Note:    Date:  09/05/2022  ID:  [name], DOB [birth year], MRN [id number] [facility]: [facility] Providers Cardiologist:  Donato Schultz, MD          Patient Profile:   Persistent Atrial fibrillation/flutter TTE 08/10/20: EF 55-60 no RWMA, normal RVSF, mildly elevated PASP, RVSP 39.4, mild to moderate BAE, trivial MR  Diabetes mellitus  Hypertension  Hyperlipidemia  Hyperthyroidism       History of Present Illness:   Kayla Griffin is a 82 y.o. female who returns for ongoing management of atrial fibrillation. She was last seen by Dr. Anne Fu 03/06/22.  She is here alone.  She ambulates with a walker.  She notes over the past couple of months she has been short of breath with moderate activities.  She sleeps on an incline chronically without significant change.  She has not had lower extremity edema, coughing, wheezing.  She has not had chest discomfort.  She has not had syncope.  Review of Systems  Constitutional: Negative for fever.  Respiratory:  Negative for cough and wheezing.   Gastrointestinal:  Negative for hematochezia and melena.  Genitourinary:  Negative for hematuria.    See HPI    Studies Reviewed:    EKG: NSR, HR 64, left axis deviation, LVH, IVCD, inferior and anterolateral Q waves, nonspecific ST-T wave changes, QTc 435, no change since prior tracing dated 03/06/2022  Risk Assessment/Calculations:    CHA2DS2-VASc Score = 5   This indicates a 7.2% annual risk of stroke. The patient's score is based upon: CHF History: 0 HTN History: 1 Diabetes History: 1 Stroke History: 0 Vascular Disease History: 0 Age Score: 2 Gender Score: 1            Physical Exam:   VS:  BP 138/80   Pulse 64   Ht 5\' 4"  (1.626 m)   Wt 196 lb 3.2 oz (89 kg)   LMP  (LMP Unknown)   SpO2 96%   BMI 33.68 kg/m    Wt Readings from Last 3 Encounters:  09/05/22 196 lb 3.2 oz (89 kg)  03/06/22 188 lb 6.4 oz (85.5 kg)   08/17/21 179 lb 6.4 oz (81.4 kg)    Constitutional:      Appearance: Healthy appearance. Not in distress.  Neck:     Vascular: No JVR. JVD normal.  Pulmonary:     Breath sounds: Normal breath sounds. No wheezing. No rales.  Cardiovascular:     Normal rate. Regular rhythm.     Murmurs: There is no murmur.  Edema:    Peripheral edema absent.  Abdominal:     Palpations: Abdomen is soft.       ASSESSMENT AND PLAN:   SOB (shortness of breath) She notes dyspnea exertion over the past 2 to 3 months.  She is NYHA II-IIb.  Volume status appears stable on exam.  She has no rales on exam and her neck veins are flat.  She has no evidence of edema.  She does not have a history of heart failure.  She has not had chest discomfort.  Electrocardiogram today demonstrates no significant change since last tracing. Obtain 2D echocardiogram BMET, CBC, BNP Start furosemide if BNP significantly elevated Obtain chest x-ray Follow-up 3 months. If shortness of breath continues or worsens, consider stress testing.  Longstanding persistent atrial fibrillation (HCC) She remains in normal sinus rhythm.  She has occasional palpitations but these are short-lived (seconds).  She is tolerating anticoagulation.  Her weight is greater than 60 kg and her creatinine has been <1.5.  Continue Eliquis 5 mg twice daily, metoprolol tartrate 100 mg twice daily.  Obtain follow-up BMET, CBC today as noted.  Essential hypertension She stopped taking HCTZ about a year ago due to increased urination.  Blood pressure above target initially.  Repeat blood pressure is much better.  She notes better blood pressures at home.  Her PCP recently increased her olmesartan.  Continue amlodipine 5 mg daily, metoprolol tartrate 100 mg twice daily, olmesartan 40 mg daily.    Dispo:  Return in about 3 months (around 12/06/2022) for Follow up after testing with Dr. Anne Fu, or Tereso Newcomer, PA-C.  Signed, Tereso Newcomer, PA-C

## 2022-09-05 ENCOUNTER — Ambulatory Visit: Payer: Medicare HMO | Attending: Physician Assistant | Admitting: Physician Assistant

## 2022-09-05 ENCOUNTER — Ambulatory Visit
Admission: RE | Admit: 2022-09-05 | Discharge: 2022-09-05 | Disposition: A | Discharge status: Home / Self Care | Payer: Medicare HMO | Source: Ambulatory Visit | Attending: Physician Assistant | Admitting: Physician Assistant

## 2022-09-05 ENCOUNTER — Encounter: Payer: Self-pay | Admitting: Physician Assistant

## 2022-09-05 VITALS — BP 138/80 | HR 64 | Ht 64.0 in | Wt 196.2 lb

## 2022-09-05 DIAGNOSIS — R0602 Shortness of breath: Secondary | ICD-10-CM

## 2022-09-05 DIAGNOSIS — I4811 Longstanding persistent atrial fibrillation: Secondary | ICD-10-CM

## 2022-09-05 DIAGNOSIS — I10 Essential (primary) hypertension: Secondary | ICD-10-CM | POA: Diagnosis not present

## 2022-09-05 NOTE — Assessment & Plan Note (Addendum)
She remains in normal sinus rhythm.  She has occasional palpitations but these are short-lived (seconds).  She is tolerating anticoagulation.  Her weight is greater than 60 kg and her creatinine has been <1.5.  Continue Eliquis 5 mg twice daily, metoprolol tartrate 100 mg twice daily.  Obtain follow-up BMET, CBC today as noted.

## 2022-09-05 NOTE — Assessment & Plan Note (Addendum)
She notes dyspnea exertion over the past 2 to 3 months.  She is NYHA II-IIb.  Volume status appears stable on exam.  She has no rales on exam and her neck veins are flat.  She has no evidence of edema.  She does not have a history of heart failure.  She has not had chest discomfort.  Electrocardiogram today demonstrates no significant change since last tracing. Obtain 2D echocardiogram BMET, CBC, BNP Start furosemide if BNP significantly elevated Obtain chest x-ray Follow-up 3 months. If shortness of breath continues or worsens, consider stress testing.

## 2022-09-05 NOTE — Patient Instructions (Addendum)
Medication Instructions:  Your physician recommends that you continue on your current medications as directed. Please refer to the Current Medication list given to you today.  *If you need a refill on your cardiac medications before your next appointment, please call your pharmacy*   Lab Work: TODAY:  BMET, CBC, & PRO BNP  If you have labs (blood work) drawn today and your tests are completely normal, you will receive your results only by: MyChart Message (if you have MyChart) OR A paper copy in the mail If you have any lab test that is abnormal or we need to change your treatment, we will call you to review the results.   Testing/Procedures: Your physician has requested that you have an echocardiogram. Echocardiography is a painless test that uses sound waves to create images of your heart. It provides your doctor with information about the size and shape of your heart and how well your heart's chambers and valves are working. This procedure takes approximately one hour. There are no restrictions for this procedure. Please do NOT wear cologne, perfume, aftershave, or lotions (deodorant is allowed). Please arrive 15 minutes prior to your appointment time.  A chest x-ray takes a picture of the organs and structures inside the chest, including the heart, lungs, and blood vessels. This test can show several things, including, whether the heart is enlarges; whether fluid is building up in the lungs; and whether pacemaker / defibrillator leads are still in place. GO TO [facility] TODAY BEFORE 4:30 [address]  [phone]    Follow-Up: At [facility], you and your health needs are our priority.  As part of our continuing mission to provide you with exceptional heart care, we have created designated Provider Care Teams.  These Care Teams include your primary Cardiologist (physician) and Advanced Practice Providers (APPs -  Physician Assistants and Nurse  Practitioners) who all work together to provide you with the care you need, when you need it.  We recommend signing up for the patient portal called "MyChart".  Sign up information is provided on this After Visit Summary.  MyChart is used to connect with patients for Virtual Visits (Telemedicine).  Patients are able to view lab/test results, encounter notes, upcoming appointments, etc.  Non-urgent messages can be sent to your provider as well.   To learn more about what you can do with MyChart, go to [email or web address].    Your next appointment:   3 month(s)    12/27/22 ARRIVE AT 11:00  Provider:   Donato Schultz, MD     Other Instructions

## 2022-09-05 NOTE — Assessment & Plan Note (Signed)
She stopped taking HCTZ about a year ago due to increased urination.  Blood pressure above target initially.  Repeat blood pressure is much better.  She notes better blood pressures at home.  Her PCP recently increased her olmesartan.  Continue amlodipine 5 mg daily, metoprolol tartrate 100 mg twice daily, olmesartan 40 mg daily.

## 2022-09-06 LAB — BASIC METABOLIC PANEL
BUN/Creatinine Ratio: 24 (ref 12–28)
BUN: 22 mg/dL (ref 8–27)
CO2: 21 mmol/L (ref 20–29)
Calcium: 10.2 mg/dL (ref 8.7–10.3)
Chloride: 99 mmol/L (ref 96–106)
Creatinine, Ser: 0.92 mg/dL (ref 0.57–1.00)
Glucose: 159 mg/dL — ABNORMAL HIGH (ref 70–99)
Potassium: 4.7 mmol/L (ref 3.5–5.2)
Sodium: 136 mmol/L (ref 134–144)
eGFR: 63 mL/min/{1.73_m2} (ref >59)

## 2022-09-06 LAB — CBC
Hematocrit: 42.7 % (ref 34.0–46.6)
Hemoglobin: 14.1 g/dL (ref 11.1–15.9)
MCH: 29.7 pg (ref 26.6–33.0)
MCHC: 33 g/dL (ref 31.5–35.7)
MCV: 90 fL (ref 79–97)
Platelets: 225 10*3/uL (ref 150–450)
RBC: 4.75 x10E6/uL (ref 3.77–5.28)
RDW: 13.5 % (ref 11.7–15.4)
WBC: 9.9 10*3/uL (ref 3.4–10.8)

## 2022-09-06 LAB — PRO B NATRIURETIC PEPTIDE: NT-Pro BNP: 678 pg/mL (ref 0–738)

## 2022-09-07 NOTE — Progress Notes (Signed)
Pt has been made aware of normal result and verbalized understanding.  jw

## 2022-10-05 DIAGNOSIS — M810 Age-related osteoporosis without current pathological fracture: Secondary | ICD-10-CM | POA: Diagnosis not present

## 2022-10-05 DIAGNOSIS — E0590 Thyrotoxicosis, unspecified without thyrotoxic crisis or storm: Secondary | ICD-10-CM | POA: Diagnosis not present

## 2022-10-05 DIAGNOSIS — E0500 Thyrotoxicosis with diffuse goiter without thyrotoxic crisis or storm: Secondary | ICD-10-CM | POA: Diagnosis not present

## 2022-10-09 ENCOUNTER — Encounter: Payer: Self-pay | Admitting: Physician Assistant

## 2022-10-09 ENCOUNTER — Ambulatory Visit (HOSPITAL_COMMUNITY): Payer: Medicare HMO | Attending: Physician Assistant

## 2022-10-09 DIAGNOSIS — I4811 Longstanding persistent atrial fibrillation: Secondary | ICD-10-CM | POA: Insufficient documentation

## 2022-10-09 DIAGNOSIS — I340 Nonrheumatic mitral (valve) insufficiency: Secondary | ICD-10-CM | POA: Insufficient documentation

## 2022-10-09 DIAGNOSIS — R0602 Shortness of breath: Secondary | ICD-10-CM | POA: Diagnosis not present

## 2022-10-09 HISTORY — DX: Nonrheumatic mitral (valve) insufficiency: I34.0

## 2022-10-09 LAB — ECHOCARDIOGRAM COMPLETE: S' Lateral: 3.6 cm

## 2022-10-13 ENCOUNTER — Telehealth: Payer: Self-pay | Admitting: Physician Assistant

## 2022-10-13 NOTE — Telephone Encounter (Signed)
Follow Up:       Patient says she have been returning a call to [facility] from 10-10-22, concerning her results.

## 2022-10-13 NOTE — Telephone Encounter (Signed)
Beatrice Lecher, PA-C 10/09/2022  5:03 PM EDT     Echo shows normal EF.  There is mild mitral regurgitation.  This is not significant and not contributing to any symptoms.  We can follow this with a repeat echo in 2 to 3 years. I will send to PCP as FYI PLAN: - Continue current medications/treatment plan and follow up as scheduled. Tereso Newcomer, PA-C   10/09/2022 5:00 PM     Returned call to patient and provided above information. No further questions/concerns.

## 2022-10-19 DIAGNOSIS — I700 Atherosclerosis of aorta: Secondary | ICD-10-CM | POA: Diagnosis not present

## 2022-10-19 DIAGNOSIS — E0500 Thyrotoxicosis with diffuse goiter without thyrotoxic crisis or storm: Secondary | ICD-10-CM | POA: Diagnosis not present

## 2022-10-19 DIAGNOSIS — D6869 Other thrombophilia: Secondary | ICD-10-CM | POA: Diagnosis not present

## 2022-10-19 DIAGNOSIS — Z0000 Encounter for general adult medical examination without abnormal findings: Secondary | ICD-10-CM | POA: Diagnosis not present

## 2022-10-19 DIAGNOSIS — Z794 Long term (current) use of insulin: Secondary | ICD-10-CM | POA: Diagnosis not present

## 2022-10-19 DIAGNOSIS — E7800 Pure hypercholesterolemia, unspecified: Secondary | ICD-10-CM | POA: Diagnosis not present

## 2022-10-19 DIAGNOSIS — Z6833 Body mass index (BMI) 33.0-33.9, adult: Secondary | ICD-10-CM | POA: Diagnosis not present

## 2022-10-19 DIAGNOSIS — E11319 Type 2 diabetes mellitus with unspecified diabetic retinopathy without macular edema: Secondary | ICD-10-CM | POA: Diagnosis not present

## 2022-10-19 DIAGNOSIS — M810 Age-related osteoporosis without current pathological fracture: Secondary | ICD-10-CM | POA: Diagnosis not present

## 2022-10-19 DIAGNOSIS — I10 Essential (primary) hypertension: Secondary | ICD-10-CM | POA: Diagnosis not present

## 2022-10-19 DIAGNOSIS — Z23 Encounter for immunization: Secondary | ICD-10-CM | POA: Diagnosis not present

## 2022-11-02 DIAGNOSIS — R351 Nocturia: Secondary | ICD-10-CM | POA: Diagnosis not present

## 2022-11-02 DIAGNOSIS — Z4689 Encounter for fitting and adjustment of other specified devices: Secondary | ICD-10-CM | POA: Diagnosis not present

## 2022-11-02 DIAGNOSIS — N8111 Cystocele, midline: Secondary | ICD-10-CM | POA: Diagnosis not present

## 2022-11-21 DIAGNOSIS — I10 Essential (primary) hypertension: Secondary | ICD-10-CM | POA: Diagnosis not present

## 2022-11-21 DIAGNOSIS — E7800 Pure hypercholesterolemia, unspecified: Secondary | ICD-10-CM | POA: Diagnosis not present

## 2022-11-21 DIAGNOSIS — Z794 Long term (current) use of insulin: Secondary | ICD-10-CM | POA: Diagnosis not present

## 2022-11-21 DIAGNOSIS — E11319 Type 2 diabetes mellitus with unspecified diabetic retinopathy without macular edema: Secondary | ICD-10-CM | POA: Diagnosis not present

## 2022-12-27 ENCOUNTER — Ambulatory Visit: Payer: Medicare HMO | Attending: Cardiology | Admitting: Cardiology

## 2022-12-27 ENCOUNTER — Ambulatory Visit: Payer: Medicare HMO | Admitting: Cardiology

## 2022-12-27 ENCOUNTER — Encounter: Payer: Self-pay | Admitting: Cardiology

## 2022-12-27 VITALS — BP 154/78 | HR 77 | Ht 64.0 in | Wt 177.0 lb

## 2022-12-27 DIAGNOSIS — R0602 Shortness of breath: Secondary | ICD-10-CM

## 2022-12-27 DIAGNOSIS — I4819 Other persistent atrial fibrillation: Secondary | ICD-10-CM

## 2022-12-27 MED ORDER — FUROSEMIDE 20 MG PO TABS: 20.0000 mg | ORAL_TABLET | Freq: Every day | ORAL | 3 refills | Status: DC

## 2022-12-27 MED ORDER — SPIRONOLACTONE 25 MG PO TABS: 12.5000 mg | ORAL_TABLET | Freq: Every day | ORAL | 3 refills | Status: DC

## 2022-12-27 NOTE — Progress Notes (Signed)
Cardiology Office Note:  .   Date:  12/27/2022  ID:  Kayla Griffin, DOB [birth year], MRN [id number] PCP: [facility] Providers Cardiologist:  Donato Schultz, MD    History of Present Illness: Marland Kitchen   Kayla Griffin is a 82 y.o. female here with significant shortness of breath.  NYHA class III/IV type symptoms.  Echocardiogram normal ejection fraction, mild MR  Discussed the use of AI scribe software .  History of Present Illness   The patient, with a history of atrial fibrillation, presents with progressive dyspnea on exertion. She reports being 'winded' with minimal exertion, such as walking from the kitchen to the living room, cleaning, and grocery shopping. The dyspnea is so severe that she needs to rest for 'at least five or more minutes' to catch her breath after carrying groceries into the house. This is a significant change from a year ago when she only experienced dyspnea when climbing stairs. She denies a history of smoking and has no known lung disease.  The patient also has a history of diabetes, which is well controlled with monitoring. She has been having trouble sleeping due to frequent urination, but this has improved with a new medication, allowing her to sleep for four to five hours at night without needing to urinate.       ROS: No CP  Studies Reviewed: Marland Kitchen        LABS Cr: 0.92 (08/2022) Pro BNP: 678 (08/2022) Hb: 14.1 (08/2022) HbA1c: 6.7 (08/2022) LDL: 44 (08/2022)  RADIOLOGY Chest X-ray: Normal (09/05/2022)  DIAGNOSTIC Echocardiogram: Normal pump function, mild mitral valve regurgitation (10/09/2022) Risk Assessment/Calculations:    CHA2DS2-VASc Score = 5   This indicates a 7.2% annual risk of stroke. The patient's score is based upon: CHF History: 0 HTN History: 1 Diabetes History: 1 Stroke History: 0 Vascular Disease History: 0 Age Score: 2 Gender Score: 1           Physical Exam:   VS:  BP (!)  154/78   Pulse 77   Ht 5\' 4"  (1.626 m)   Wt 177 lb (80.3 kg)   LMP  (LMP Unknown)   SpO2 97%   BMI 30.38 kg/m    Wt Readings from Last 3 Encounters:  12/27/22 177 lb (80.3 kg)  09/05/22 196 lb 3.2 oz (89 kg)  03/06/22 188 lb 6.4 oz (85.5 kg)    GEN: Well nourished, well developed in no acute distress. Looks winded NECK: No JVD; No carotid bruits CARDIAC: IRRR, no murmurs, rubs, gallops RESPIRATORY:  Clear to auscultation without rales, wheezing or rhonchi  ABDOMEN: Soft, non-tender, non-distended EXTREMITIES:  No edema; No deformity   ASSESSMENT AND PLAN: .    Assessment and Plan    Exertional Dyspnea Significant dyspnea on exertion, out of proportion to atrial fibrillation. Normal echocardiogram and chest x-ray. Mildly elevated pro-BNP suggesting possible fluid overload. No history of smoking. -Start Furosemide 20mg  daily and Spironolactone 12.5mg  daily in the morning to reduce fluid overload. -Check renal function and electrolytes after starting diuretics. (2 weeks follow up BMET) -Can not afford Jardiace or Entresto  Atrial Fibrillation Perm Stable on current regimen. -Continue current medications. Metoprolol  Mild Diabetes Hemoglobin A1c 6.7, indicating good control. -Continue current management and monitoring.  Follow-up Significant dyspnea on exertion and initiation of new medications necessitate close follow-up. -Schedule follow-up appointment soon to assess response to diuretics and check labs.  Signed, Donato Schultz, MD

## 2022-12-27 NOTE — Patient Instructions (Signed)
Medication Instructions:  Please start Furosemide 20 mg a day. Start Spironolactone 25 mg - take 1/2 tablet daily. Continue all other medications as listed.  *If you need a refill on your cardiac medications before your next appointment, please call your pharmacy*  Follow-Up: At [facility], you and your health needs are our priority.  As part of our continuing mission to provide you with exceptional heart care, we have created designated Provider Care Teams.  These Care Teams include your primary Cardiologist (physician) and Advanced Practice Providers (APPs -  Physician Assistants and Nurse Practitioners) who all work together to provide you with the care you need, when you need it.  We recommend signing up for the patient portal called "MyChart".  Sign up information is provided on this After Visit Summary.  MyChart is used to connect with patients for Virtual Visits (Telemedicine).  Patients are able to view lab/test results, encounter notes, upcoming appointments, etc.  Non-urgent messages can be sent to your provider as well.   To learn more about what you can do with MyChart, go to [email or web address].    Your next appointment:   2 week(s)  Provider:   Donato Schultz, MD

## 2023-01-11 ENCOUNTER — Telehealth: Payer: Self-pay | Admitting: Cardiology

## 2023-01-11 ENCOUNTER — Ambulatory Visit: Payer: Medicare HMO | Admitting: Cardiology

## 2023-01-11 NOTE — Telephone Encounter (Signed)
No answer and no voicemail - will attempt to contact pt again

## 2023-01-11 NOTE — Telephone Encounter (Signed)
Patient stated she had to reschedule visit today as she is having car trouble today and the water pill is not working.  Patient noted her sister is in hospice.

## 2023-01-11 NOTE — Telephone Encounter (Signed)
Spoke with pt who reports she is still short of breath.  She states it may be a little bit better but not much improvement at all. Reports getting tired frequently.  She is also under a lot of stress with her sister being in Hospice currently.   Reassurance given regarding her sadness and understandable grief.  Reschedule her appt to 10/9 at 8:40 am.  Pt does state she may need to reschedule again based on her current situation with her sister. Advised this is completely understandable but we do need to make sure she is taken care of as well.  She will call back prior to her appt should symptoms worsen.  Dr Anne Fu is aware.

## 2023-01-12 ENCOUNTER — Other Ambulatory Visit: Payer: Self-pay | Admitting: Cardiology

## 2023-01-12 DIAGNOSIS — I480 Paroxysmal atrial fibrillation: Secondary | ICD-10-CM

## 2023-01-12 NOTE — Telephone Encounter (Signed)
Prescription refill request for Eliquis received. Indication:afib Last office visit:9/24 Scr:0.92  5/24 Age: 82 Weight:80.3  kg  Prescription refilled

## 2023-01-17 ENCOUNTER — Ambulatory Visit: Payer: Medicare HMO | Attending: Cardiology | Admitting: Cardiology

## 2023-01-17 ENCOUNTER — Encounter: Payer: Self-pay | Admitting: Cardiology

## 2023-01-17 VITALS — BP 148/80 | HR 64 | Wt 186.0 lb

## 2023-01-17 DIAGNOSIS — R0602 Shortness of breath: Secondary | ICD-10-CM

## 2023-01-17 DIAGNOSIS — E119 Type 2 diabetes mellitus without complications: Secondary | ICD-10-CM | POA: Diagnosis not present

## 2023-01-17 DIAGNOSIS — Z7901 Long term (current) use of anticoagulants: Secondary | ICD-10-CM | POA: Diagnosis not present

## 2023-01-17 DIAGNOSIS — I10 Essential (primary) hypertension: Secondary | ICD-10-CM | POA: Diagnosis not present

## 2023-01-17 DIAGNOSIS — I4811 Longstanding persistent atrial fibrillation: Secondary | ICD-10-CM

## 2023-01-17 NOTE — Patient Instructions (Signed)
Medication Instructions:   *If you need a refill on your cardiac medications before your next appointment, please call your pharmacy*   Lab Work: BMET AND CBC TODAY  If you have labs (blood work) drawn today and your tests are completely normal, you will receive your results only by: MyChart Message (if you have MyChart) OR A paper copy in the mail If you have any lab test that is abnormal or we need to change your treatment, we will call you to review the results.   Testing/Procedures:    Follow-Up: At [facility], you and your health needs are our priority.  As part of our continuing mission to provide you with exceptional heart care, we have created designated Provider Care Teams.  These Care Teams include your primary Cardiologist (physician) and Advanced Practice Providers (APPs -  Physician Assistants and Nurse Practitioners) who all work together to provide you with the care you need, when you need it.  We recommend signing up for the patient portal called "MyChart".  Sign up information is provided on this After Visit Summary.  MyChart is used to connect with patients for Virtual Visits (Telemedicine).  Patients are able to view lab/test results, encounter notes, upcoming appointments, etc.  Non-urgent messages can be sent to your provider as well.   To learn more about what you can do with MyChart, go to [email or web address].    Your next appointment:   6 month(s)  Provider:  WITH ANY APP     Other Instructions

## 2023-01-17 NOTE — Progress Notes (Signed)
Cardiology Office Note:  .   Date:  01/17/2023  ID:  Kayla Griffin, DOB 07-03-1940, MRN [id number] PCP: [facility] @ [facility] Providers Cardiologist:  Donato Schultz, MD     History of Present Illness: Marland Kitchen   Kayla Griffin is a 82 y.o. female Discussed with the use of AI scribe software   History of Present Illness   An 82 year old patient with a history of persistent atrial fibrillation, mild mitral regurgitation, essential hypertension, and diabetes presents for a follow-up visit. The patient is on a regimen of Eliquis, atorvastatin, methimazole, metoprolol, olmesartan, spironolactone, furosemide, and amlodipine. An echocardiogram performed earlier in the year showed a normal ejection fraction and mild mitral regurgitation, with a severely dilated left atrium.  The patient reports significant shortness of breath, experiencing NYHA class 3/4 symptoms and becoming winded with minimal exertion, such as walking from the kitchen to the living room. The patient's diabetes is well-controlled, with a recent hemoglobin A1c of 6.7.  The patient reported continued shortness of breath during a phone call earlier in the month, with only slight improvement. The patient is also dealing with significant grief due to the recent passing of her sister. Despite these challenges, the patient's atrial fibrillation is well-controlled with metoprolol, and her diabetes is under good control. The patient's blood pressure was noted to be low the previous night, but has since returned to normal.          ROS:   Studies Reviewed: .        Results LABS Pro BNP: 678 HbA1c: 7.3  RADIOLOGY Chest x-ray: Negative for acute cardiopulmonary disease (09/05/2022)  DIAGNOSTIC Echocardiogram: Normal ejection fraction, mild mitral regurgitation, severely dilated left atrium (10/09/2022)  Risk Assessment/Calculations:           Physical Exam:   VS:  BP (!) 148/80 (BP  Location: Right Arm, Patient Position: Sitting, Cuff Size: Normal)   Pulse 64   Wt 186 lb (84.4 kg)   LMP  (LMP Unknown)   SpO2 97%   BMI 31.93 kg/m    Wt Readings from Last 3 Encounters:  01/17/23 186 lb (84.4 kg)  12/27/22 177 lb (80.3 kg)  09/05/22 196 lb 3.2 oz (89 kg)    GEN: Well nourished, well developed in no acute distress uses walker NECK: No JVD; No carotid bruits CARDIAC: RRR, no murmurs, no rubs, no gallops RESPIRATORY:  Clear to auscultation without rales, wheezing or rhonchi  ABDOMEN: Soft, non-tender, non-distended EXTREMITIES:  No edema; No deformity   ASSESSMENT AND PLAN: .    Assessment and Plan    Atrial Fibrillation Long-standing persistent atrial fibrillation with severe left atrial dilation. Rate controlled with Metoprolol 100mg  BID. Anticoagulated with Eliquis 5mg  BID. -Continue current management.  Mild Mitral Regurgitation Not contributing to symptoms. Echocardiogram on 10/09/2022 showed normal ejection fraction. -Repeat echocardiogram in 2-3 years.  Heart Failure with Preserved Ejection Fraction (HFpEF) NYHA class 3/4 symptoms with shortness of breath on minimal exertion. Mildly elevated pro-BNP. Started on Furosemide 20mg  daily and Spironolactone 12.5mg  daily at last visit. -Check basic metabolic profile and CBC today to assess kidney function and electrolytes. -Continue current management.  Hypertension Managed with Olmesartan 40mg  daily, Spironolactone 12.5mg  daily, Furosemide 20mg  daily, and Amlodipine 5mg  daily. -Continue current management.  Hyperlipidemia Managed with Atorvastatin 40mg  daily. -Continue current management.  Hyperthyroidism Managed with Methimazole 10mg  daily. -Continue current management.  Type 2 Diabetes Mellitus Well-controlled with a recent HbA1c of 6.7. -Continue current management.  Brief reaction -Just lost her sister.  Hospice. she stopped dialysis.  She was DNR.  Had had brief CPR prior to that broken rib  etc.  Follow-up in 6 months with APP.               Signed, Donato Schultz, MD

## 2023-01-18 LAB — BASIC METABOLIC PANEL
BUN/Creatinine Ratio: 23 (ref 12–28)
BUN: 25 mg/dL (ref 8–27)
CO2: 20 mmol/L (ref 20–29)
Calcium: 9.7 mg/dL (ref 8.7–10.3)
Chloride: 96 mmol/L (ref 96–106)
Creatinine, Ser: 1.1 mg/dL — ABNORMAL HIGH (ref 0.57–1.00)
Glucose: 212 mg/dL — ABNORMAL HIGH (ref 70–99)
Potassium: 5.5 mmol/L — ABNORMAL HIGH (ref 3.5–5.2)
Sodium: 132 mmol/L — ABNORMAL LOW (ref 134–144)
eGFR: 50 mL/min/{1.73_m2} — ABNORMAL LOW (ref >59)

## 2023-01-18 LAB — CBC
Hematocrit: 42.5 % (ref 34.0–46.6)
Hemoglobin: 13.4 g/dL (ref 11.1–15.9)
MCH: 29.5 pg (ref 26.6–33.0)
MCHC: 31.5 g/dL (ref 31.5–35.7)
MCV: 94 fL (ref 79–97)
Platelets: 216 10*3/uL (ref 150–450)
RBC: 4.54 x10E6/uL (ref 3.77–5.28)
RDW: 13 % (ref 11.7–15.4)
WBC: 10.9 10*3/uL — ABNORMAL HIGH (ref 3.4–10.8)

## 2023-01-19 DIAGNOSIS — I10 Essential (primary) hypertension: Secondary | ICD-10-CM | POA: Diagnosis not present

## 2023-01-19 DIAGNOSIS — Z23 Encounter for immunization: Secondary | ICD-10-CM | POA: Diagnosis not present

## 2023-01-19 DIAGNOSIS — Z794 Long term (current) use of insulin: Secondary | ICD-10-CM | POA: Diagnosis not present

## 2023-01-19 DIAGNOSIS — E11319 Type 2 diabetes mellitus with unspecified diabetic retinopathy without macular edema: Secondary | ICD-10-CM | POA: Diagnosis not present

## 2023-02-08 IMAGING — DX DG WRIST COMPLETE 3+V*L*
4 series · 4 of 4 positions shown · non-contrast
Comparison: None.

CLINICAL DATA: Hand and wrist pain, swelling, and redness, no known
injury

EXAM:
LEFT HAND - COMPLETE 3+ VIEW; LEFT WRIST - COMPLETE 3+ VIEW

[wrist pa]
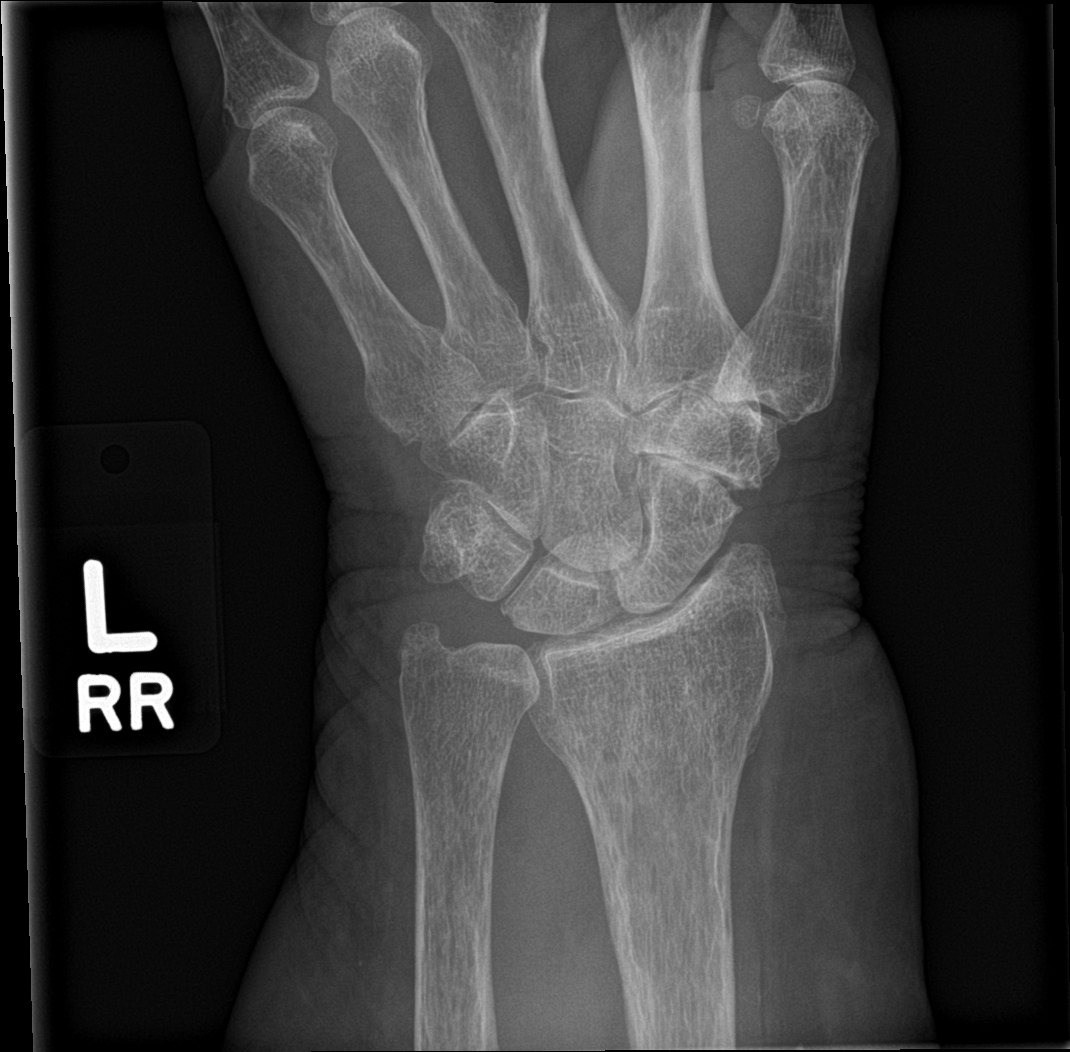

[wrist obl]
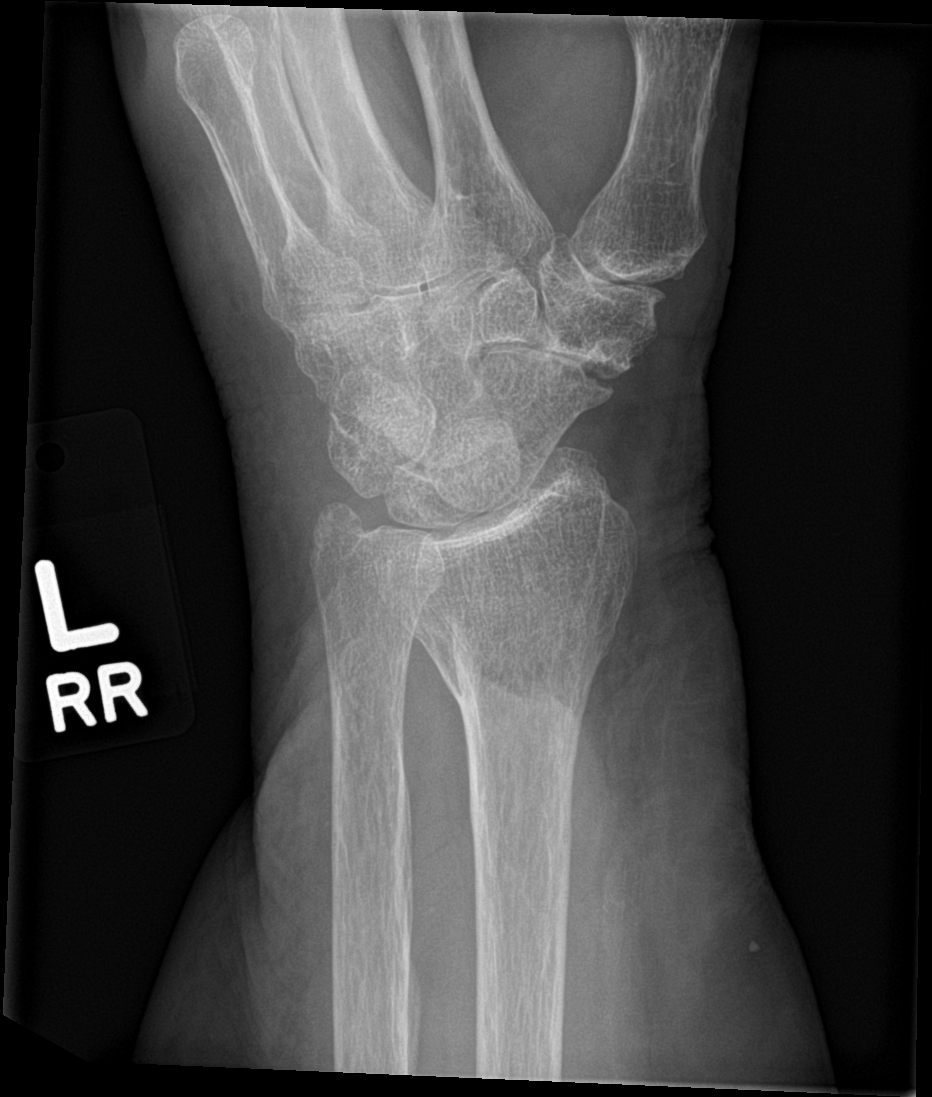

[wrist lat]
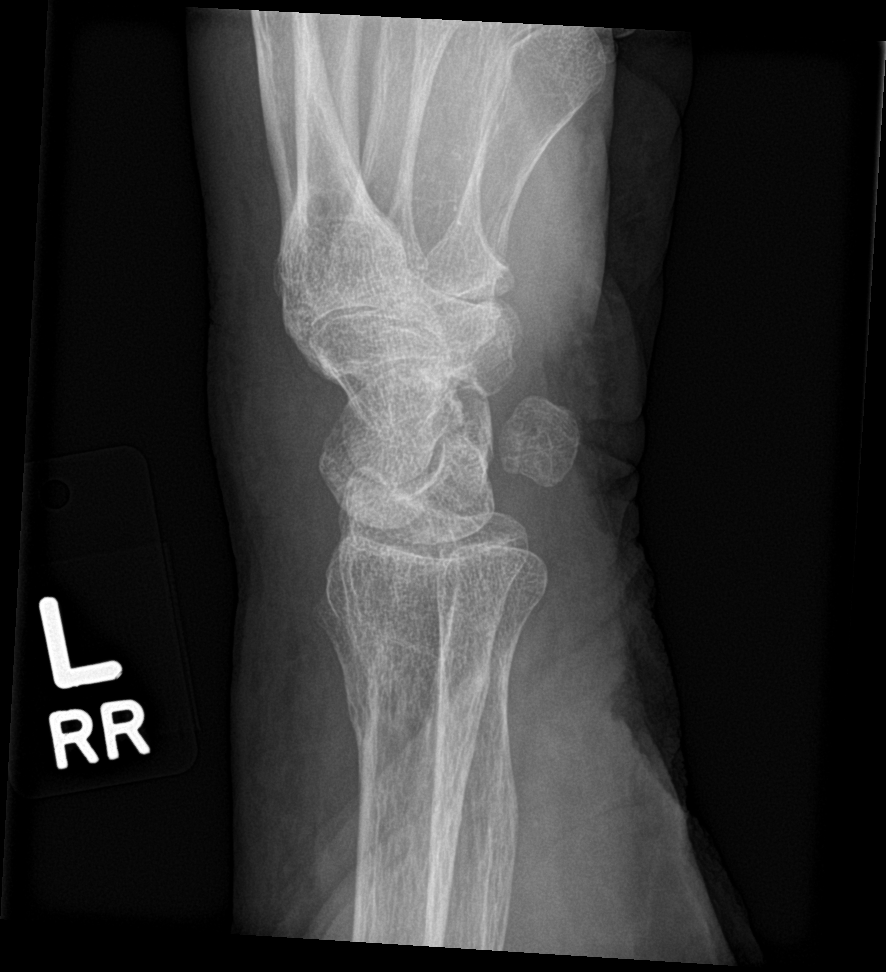

[wrist navicular]
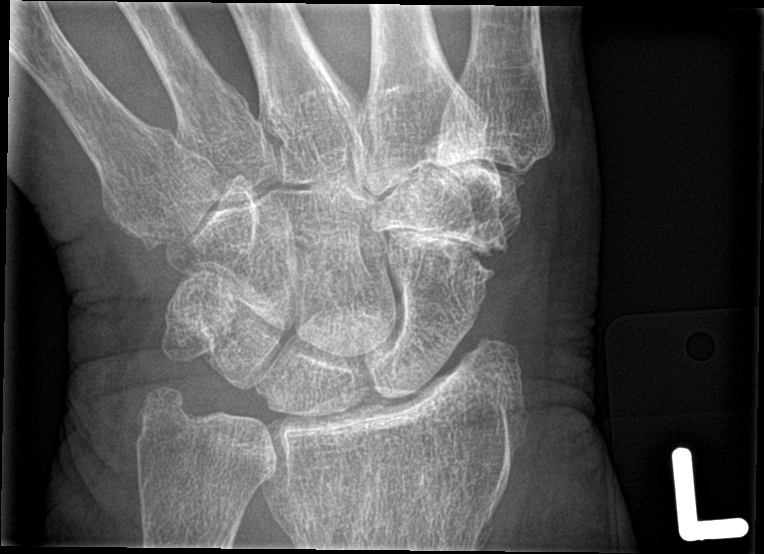

[4 of 4 positions shown; findings below may reference images not displayed]

FINDINGS: Osteopenia. There is no evidence of acute fracture or dislocation.
Probable chronic, callused fracture deformity of the distal left
radial metadiaphysis. Generally mild osteoarthritic pattern
arthrosis about the hand and wrist. Diffuse soft tissue edema about
the hand and wrist.
IMPRESSION: 1. No evidence of acute fracture or dislocation. Probable chronic,
callused fracture deformity of the distal left radial metadiaphysis.
2. Diffuse soft tissue edema about the hand and wrist.
3. Mild osteoarthritic pattern arthrosis.
4. Osteopenia.

## 2023-02-26 ENCOUNTER — Ambulatory Visit: Payer: Medicare HMO | Admitting: Cardiology

## 2023-03-30 DIAGNOSIS — E0590 Thyrotoxicosis, unspecified without thyrotoxic crisis or storm: Secondary | ICD-10-CM | POA: Diagnosis not present

## 2023-03-30 DIAGNOSIS — M810 Age-related osteoporosis without current pathological fracture: Secondary | ICD-10-CM | POA: Diagnosis not present

## 2023-03-30 DIAGNOSIS — Z794 Long term (current) use of insulin: Secondary | ICD-10-CM | POA: Diagnosis not present

## 2023-03-30 DIAGNOSIS — E0500 Thyrotoxicosis with diffuse goiter without thyrotoxic crisis or storm: Secondary | ICD-10-CM | POA: Diagnosis not present

## 2023-03-30 DIAGNOSIS — E119 Type 2 diabetes mellitus without complications: Secondary | ICD-10-CM | POA: Diagnosis not present

## 2023-04-27 DIAGNOSIS — R5383 Other fatigue: Secondary | ICD-10-CM | POA: Diagnosis not present

## 2023-04-27 DIAGNOSIS — Z6831 Body mass index (BMI) 31.0-31.9, adult: Secondary | ICD-10-CM | POA: Diagnosis not present

## 2023-04-27 DIAGNOSIS — J069 Acute upper respiratory infection, unspecified: Secondary | ICD-10-CM | POA: Diagnosis not present

## 2023-05-16 DIAGNOSIS — Z03818 Encounter for observation for suspected exposure to other biological agents ruled out: Secondary | ICD-10-CM | POA: Diagnosis not present

## 2023-05-16 DIAGNOSIS — R52 Pain, unspecified: Secondary | ICD-10-CM | POA: Diagnosis not present

## 2023-05-16 DIAGNOSIS — J101 Influenza due to other identified influenza virus with other respiratory manifestations: Secondary | ICD-10-CM | POA: Diagnosis not present

## 2023-06-07 DIAGNOSIS — Z4689 Encounter for fitting and adjustment of other specified devices: Secondary | ICD-10-CM | POA: Diagnosis not present

## 2023-06-13 ENCOUNTER — Encounter: Payer: Self-pay | Admitting: Podiatry

## 2023-06-13 ENCOUNTER — Ambulatory Visit (INDEPENDENT_AMBULATORY_CARE_PROVIDER_SITE_OTHER): Admitting: Podiatry

## 2023-06-13 DIAGNOSIS — M79675 Pain in left toe(s): Secondary | ICD-10-CM

## 2023-06-13 DIAGNOSIS — E119 Type 2 diabetes mellitus without complications: Secondary | ICD-10-CM | POA: Diagnosis not present

## 2023-06-13 DIAGNOSIS — M79674 Pain in right toe(s): Secondary | ICD-10-CM | POA: Diagnosis not present

## 2023-06-13 DIAGNOSIS — B351 Tinea unguium: Secondary | ICD-10-CM | POA: Diagnosis not present

## 2023-06-13 DIAGNOSIS — I10 Essential (primary) hypertension: Secondary | ICD-10-CM

## 2023-06-13 NOTE — Addendum Note (Signed)
 Addended by: Helane Gunther on: 06/13/2023 01:42 PM   Modules accepted: Level of Service

## 2023-06-13 NOTE — Progress Notes (Addendum)
 This patient presents to the office with chief complaint of long thick nails and diabetic feet.  This patient  says there  is  no pain and discomfort in her feet.  This patient says there are long thick painful nails.  These nails are painful walking and wearing shoes.  Patient has no history of infection or drainage from both feet.  Patient is unable to  self treat his own nails . This patient presents  to the office today for treatment of the  long nails and a foot evaluation due to history of  diabetes. Chronic anticoagulation due to eliquis.  General Appearance  Alert, conversant and in no acute stress.  Vascular  Dorsalis pedis and posterior tibial  pulses are  weakly palpable  bilaterally.  Capillary return is within normal limits  bilaterally. Cold feet  B/L.bilaterally.  Neurologic  Senn-Weinstein monofilament wire test within normal limits  bilaterally. Muscle power within normal limits bilaterally.  Nails Thick disfigured discolored nails with subungual debris  from hallux to fifth toes bilaterally. No evidence of bacterial infection or drainage bilaterally.  Orthopedic  No limitations of motion of motion feet .  No crepitus or effusions noted.  No bony pathology or digital deformities noted.  Skin  normotropic skin with no porokeratosis noted bilaterally.  No signs of infections or ulcers noted.     Onychomycosis  Diabetes with no foot complications  IE  Debride nails x 10.  A diabetic foot exam was performed and there is evidence of any vascular  pathology.  LOPS  WNL.  RTC 3 months.   Helane Gunther DPM

## 2023-06-14 DIAGNOSIS — E0590 Thyrotoxicosis, unspecified without thyrotoxic crisis or storm: Secondary | ICD-10-CM | POA: Diagnosis not present

## 2023-06-14 DIAGNOSIS — E7800 Pure hypercholesterolemia, unspecified: Secondary | ICD-10-CM | POA: Diagnosis not present

## 2023-06-14 DIAGNOSIS — I10 Essential (primary) hypertension: Secondary | ICD-10-CM | POA: Diagnosis not present

## 2023-06-14 DIAGNOSIS — E11319 Type 2 diabetes mellitus with unspecified diabetic retinopathy without macular edema: Secondary | ICD-10-CM | POA: Diagnosis not present

## 2023-06-14 DIAGNOSIS — I4891 Unspecified atrial fibrillation: Secondary | ICD-10-CM | POA: Diagnosis not present

## 2023-07-04 DIAGNOSIS — H353132 Nonexudative age-related macular degeneration, bilateral, intermediate dry stage: Secondary | ICD-10-CM | POA: Diagnosis not present

## 2023-07-04 DIAGNOSIS — H401132 Primary open-angle glaucoma, bilateral, moderate stage: Secondary | ICD-10-CM | POA: Diagnosis not present

## 2023-07-04 DIAGNOSIS — E119 Type 2 diabetes mellitus without complications: Secondary | ICD-10-CM | POA: Diagnosis not present

## 2023-07-04 DIAGNOSIS — H524 Presbyopia: Secondary | ICD-10-CM | POA: Diagnosis not present

## 2023-07-06 ENCOUNTER — Other Ambulatory Visit: Payer: Self-pay | Admitting: Cardiology

## 2023-07-06 DIAGNOSIS — I480 Paroxysmal atrial fibrillation: Secondary | ICD-10-CM

## 2023-07-06 NOTE — Telephone Encounter (Signed)
 Eliquis 5mg  refill request received. Patient is 83 years old, weight-84.4kg, Crea-1.10 on 01/17/23, Diagnosis-Afib, and last seen by Dr. Anne Fu on 01/17/23. Dose is appropriate based on dosing criteria. Will send in refill to requested pharmacy.

## 2023-07-15 DIAGNOSIS — I5030 Unspecified diastolic (congestive) heart failure: Secondary | ICD-10-CM | POA: Insufficient documentation

## 2023-07-15 NOTE — Progress Notes (Signed)
 Cardiology Office Note:    Date:  07/16/2023  ID:  Dub Amis, DOB [birth year], MRN [id number] PCP: [facility] Providers Cardiologist:  Donato Schultz, MD       Patient Profile:      Persistent Atrial fibrillation/flutter TTE 08/10/20: EF 55-60 no RWMA, normal RVSF, mildly elevated PASP, RVSP 39.4, mild to moderate BAE, trivial MR  TTE 10/09/22: EF 60-65, no RWMA, normal RVSF, severe LAE, mild MR, AV sclerosis (HFpEF) heart failure with preserved ejection fraction  Mild mitral regurgitation Diabetes mellitus  Hypertension  Hyperlipidemia  Hyperthyroidism         Discussed the use of AI scribe software for clinical note transcription with the patient, who gave verbal consent to proceed.  History of Present Illness Kayla Griffin is a 83 y.o. female who returns for follow up of AFib, CHF. She was last seen by Dr. Anne Fu in 01/2023.   She is here alone. She experiences occasional episodes of atrial fibrillation/flutter, which are infrequent and last only a few minutes. There have been no prolonged episodes of heart racing. She experiences shortness of breath, which is improving with weight loss, as it 'isn't taking her as long to catch her breath as it used to.' She does not lay flat on her back when sleeping, preferring to sleep at an angle. This is unchanged. She has not had swelling in her legs. She has not had chest heaviness, tightness, or discomfort.    Review of Systems  Gastrointestinal:  Negative for hematochezia and melena.  Genitourinary:  Negative for hematuria.  -See HPI     Studies Reviewed:   EKG Interpretation Date/Time:  Monday July 16 2023 10:17:03 EDT Ventricular Rate:  67 PR Interval:  198 QRS Duration:  116 QT Interval:  428 QTC Calculation: 452 R Axis:   -88  Text Interpretation: Normal sinus rhythm Left axis deviation Inferior infarct , age undetermined Anterolateral infarct , age undetermined No  significant change since last tracing Confirmed by Tereso Newcomer [phone]) on 07/16/2023 10:40:45 AM   Results Labs - Chart Review 09/05/22: NT Pro BNP 678 01/17/23: Na 132, K 5.5, SCr 1.1, Hgb 13.4,     Risk Assessment/Calculations:    CHA2DS2-VASc Score = 5   This indicates a 7.2% annual risk of stroke. The patient's score is based upon: CHF History: 0 HTN History: 1 Diabetes History: 1 Stroke History: 0 Vascular Disease History: 0 Age Score: 2 Gender Score: 1            Physical Exam:   VS:  BP 122/68   Pulse 67   Ht 5\' 4"  (1.626 m)   Wt 179 lb (81.2 kg)   LMP  (LMP Unknown)   SpO2 94%   BMI 30.73 kg/m    Wt Readings from Last 3 Encounters:  07/16/23 179 lb (81.2 kg)  01/17/23 186 lb (84.4 kg)  12/27/22 177 lb (80.3 kg)    Constitutional:      Appearance: Healthy appearance. Not in distress.  Neck:     Vascular: JVD normal.  Pulmonary:     Breath sounds: Normal breath sounds. No wheezing. No rales.  Cardiovascular:     Normal rate. Regular rhythm.     Murmurs: There is no murmur.  Edema:    Peripheral edema absent.  Abdominal:     Palpations: Abdomen is soft.   v     Assessment and Plan:   Assessment & Plan Longstanding  persistent atrial fibrillation (HCC) She is maintaining sinus rhythm and tolerates anticoagulation.  She experiences occasional brief episodes of palpitations,, which resolve quickly. No prolonged palpitations or racing heart. No bleeding or melena reported.  - Continue Eliquis 5 mg twice daily - Continue Metoprolol tartrate 100 mg twice daily - Follow up in 6 months Chronic heart failure with preserved ejection fraction (HCC) Volume status is stable.  She is NYHA class II. Reports persistent dyspnea, slightly improved with weight loss. No orthopnea or paroxysmal nocturnal dyspnea. Sleeps with head elevated. No peripheral edema. Spironolactone was to be discontinued in October due to hyperkalemia.  It is still on her medication list. -  Continue Lasix 20 mg daily - Patient will ensure that she has discontinued spironolactone Essential hypertension Blood pressure is well controlled with current regimen.  - Continue Amlodipine 5 mg daily - Continue Olmesartan 40 mg daily - Continue Metoprolol tartrate 100 mg twice daily Mild mitral regurgitation Mild mitral regurgitation noted on echocardiogram in July 2024.   - Consider repeat echocardiogram in 2 to 3 years      Dispo:  Return in about 6 months (around 01/15/2024) for Routine Follow Up, w/ Dr. Anne Fu.  Signed, Tereso Newcomer, PA-C

## 2023-07-16 ENCOUNTER — Ambulatory Visit: Payer: Medicare HMO | Attending: Physician Assistant | Admitting: Physician Assistant

## 2023-07-16 ENCOUNTER — Encounter: Payer: Self-pay | Admitting: Physician Assistant

## 2023-07-16 VITALS — BP 122/68 | HR 67 | Ht 64.0 in | Wt 179.0 lb

## 2023-07-16 DIAGNOSIS — I4811 Longstanding persistent atrial fibrillation: Secondary | ICD-10-CM | POA: Diagnosis not present

## 2023-07-16 DIAGNOSIS — I340 Nonrheumatic mitral (valve) insufficiency: Secondary | ICD-10-CM | POA: Diagnosis not present

## 2023-07-16 DIAGNOSIS — I5032 Chronic diastolic (congestive) heart failure: Secondary | ICD-10-CM | POA: Diagnosis not present

## 2023-07-16 DIAGNOSIS — I10 Essential (primary) hypertension: Secondary | ICD-10-CM

## 2023-07-16 NOTE — Addendum Note (Signed)
 Addended by: Burnetta Sabin on: 07/16/2023 11:37 AM   Modules accepted: Orders

## 2023-07-16 NOTE — Assessment & Plan Note (Signed)
 She is maintaining sinus rhythm and tolerates anticoagulation.  She experiences occasional brief episodes of palpitations,, which resolve quickly. No prolonged palpitations or racing heart. No bleeding or melena reported.  - Continue Eliquis 5 mg twice daily - Continue Metoprolol tartrate 100 mg twice daily - Follow up in 6 months

## 2023-07-16 NOTE — Assessment & Plan Note (Signed)
 Blood pressure is well controlled with current regimen.  - Continue Amlodipine 5 mg daily - Continue Olmesartan 40 mg daily - Continue Metoprolol tartrate 100 mg twice daily

## 2023-07-16 NOTE — Patient Instructions (Signed)
 Medication Instructions:  Your physician recommends that you continue on your current medications as directed. Please refer to the Current Medication list given to you today.  *If you need a refill on your cardiac medications before your next appointment, please call your pharmacy*  Lab Work: None ordered  If you have labs (blood work) drawn today and your tests are completely normal, you will receive your results only by: MyChart Message (if you have MyChart) OR A paper copy in the mail If you have any lab test that is abnormal or we need to change your treatment, we will call you to review the results.  Testing/Procedures: None ordered  Follow-Up: At [facility], you and your health needs are our priority.  As part of our continuing mission to provide you with exceptional heart care, our providers are all part of one team.  This team includes your primary Cardiologist (physician) and Advanced Practice Providers or APPs (Physician Assistants and Nurse Practitioners) who all work together to provide you with the care you need, when you need it.  Your next appointment:   6 month(s)  Provider:   Donato Schultz, MD  or Tereso Newcomer, PA-C         We recommend signing up for the patient portal called "MyChart".  Sign up information is provided on this After Visit Summary.  MyChart is used to connect with patients for Virtual Visits (Telemedicine).  Patients are able to view lab/test results, encounter notes, upcoming appointments, etc.  Non-urgent messages can be sent to your provider as well.   To learn more about what you can do with MyChart, go to [email or web address].   Other Instructions       1st Floor: - Lobby - Registration  - Pharmacy  - Lab - Cafe  2nd Floor: - PV Lab - Diagnostic Testing (echo, CT, nuclear med)  3rd Floor: - Vacant  4th Floor: - TCTS (cardiothoracic surgery) - AFib Clinic - Structural Heart Clinic - Vascular Surgery  - Vascular  Ultrasound  5th Floor: - HeartCare Cardiology (general and EP) - Clinical Pharmacy for coumadin, hypertension, lipid, weight-loss medications, and med management appointments    Valet parking services will be available as well.

## 2023-07-16 NOTE — Assessment & Plan Note (Signed)
 Volume status is stable.  She is NYHA class II. Reports persistent dyspnea, slightly improved with weight loss. No orthopnea or paroxysmal nocturnal dyspnea. Sleeps with head elevated. No peripheral edema. Spironolactone was to be discontinued in October due to hyperkalemia.  It is still on her medication list. - Continue Lasix 20 mg daily - Patient will ensure that she has discontinued spironolactone

## 2023-07-16 NOTE — Assessment & Plan Note (Signed)
 Mild mitral regurgitation noted on echocardiogram in July 2024.   - Consider repeat echocardiogram in 2 to 3 years

## 2023-07-24 DIAGNOSIS — H353132 Nonexudative age-related macular degeneration, bilateral, intermediate dry stage: Secondary | ICD-10-CM | POA: Diagnosis not present

## 2023-07-24 DIAGNOSIS — H33331 Multiple defects of retina without detachment, right eye: Secondary | ICD-10-CM | POA: Diagnosis not present

## 2023-07-24 DIAGNOSIS — H401130 Primary open-angle glaucoma, bilateral, stage unspecified: Secondary | ICD-10-CM | POA: Diagnosis not present

## 2023-07-24 DIAGNOSIS — E113293 Type 2 diabetes mellitus with mild nonproliferative diabetic retinopathy without macular edema, bilateral: Secondary | ICD-10-CM | POA: Diagnosis not present

## 2023-07-24 DIAGNOSIS — H43813 Vitreous degeneration, bilateral: Secondary | ICD-10-CM | POA: Diagnosis not present

## 2023-08-30 DIAGNOSIS — H524 Presbyopia: Secondary | ICD-10-CM | POA: Diagnosis not present

## 2023-08-30 DIAGNOSIS — H5213 Myopia, bilateral: Secondary | ICD-10-CM | POA: Diagnosis not present

## 2023-08-30 DIAGNOSIS — H52209 Unspecified astigmatism, unspecified eye: Secondary | ICD-10-CM | POA: Diagnosis not present

## 2023-09-13 ENCOUNTER — Ambulatory Visit (INDEPENDENT_AMBULATORY_CARE_PROVIDER_SITE_OTHER): Admitting: Podiatry

## 2023-09-13 ENCOUNTER — Encounter: Payer: Self-pay | Admitting: Podiatry

## 2023-09-13 DIAGNOSIS — E119 Type 2 diabetes mellitus without complications: Secondary | ICD-10-CM | POA: Diagnosis not present

## 2023-09-13 DIAGNOSIS — M79674 Pain in right toe(s): Secondary | ICD-10-CM | POA: Diagnosis not present

## 2023-09-13 DIAGNOSIS — B351 Tinea unguium: Secondary | ICD-10-CM | POA: Diagnosis not present

## 2023-09-13 DIAGNOSIS — I10 Essential (primary) hypertension: Secondary | ICD-10-CM | POA: Diagnosis not present

## 2023-09-13 DIAGNOSIS — M79675 Pain in left toe(s): Secondary | ICD-10-CM | POA: Diagnosis not present

## 2023-09-13 NOTE — Progress Notes (Signed)
This patient returns to my office for at risk foot care.  This patient requires this care by a professional since this patient will be at risk due to having diabetes.This patient is unable to cut nails herself since the patient cannot reach her nails.These nails are painful walking and wearing shoes.  This patient presents for at risk foot care today.  General Appearance  Alert, conversant and in no acute stress.  Vascular  Dorsalis pedis and posterior tibial  pulses are  weakly palpable  bilaterally.  Capillary return is within normal limits  bilaterally. Temperature is within normal limits  bilaterally.  Neurologic  Senn-Weinstein monofilament wire test within normal limits  bilaterally. Muscle power within normal limits bilaterally.  Nails Thick disfigured discolored nails with subungual debris  from hallux to fifth toes bilaterally. No evidence of bacterial infection or drainage bilaterally.  Orthopedic  No limitations of motion  feet .  No crepitus or effusions noted.  No bony pathology or digital deformities noted.  Skin  normotropic skin with no porokeratosis noted bilaterally.  No signs of infections or ulcers noted.     Onychomycosis  Pain in right toes  Pain in left toes  Consent was obtained for treatment procedures.   Mechanical debridement of nails 1-5  bilaterally performed with a nail nipper.  Filed with dremel without incident.    Return office visit   3 months                   Told patient to return for periodic foot care and evaluation due to potential at risk complications.   Gracelin Weisberg DPM  

## 2023-10-24 DIAGNOSIS — I4891 Unspecified atrial fibrillation: Secondary | ICD-10-CM | POA: Diagnosis not present

## 2023-10-24 DIAGNOSIS — Z0000 Encounter for general adult medical examination without abnormal findings: Secondary | ICD-10-CM | POA: Diagnosis not present

## 2023-10-24 DIAGNOSIS — I10 Essential (primary) hypertension: Secondary | ICD-10-CM | POA: Diagnosis not present

## 2023-10-24 DIAGNOSIS — E7800 Pure hypercholesterolemia, unspecified: Secondary | ICD-10-CM | POA: Diagnosis not present

## 2023-10-24 DIAGNOSIS — M810 Age-related osteoporosis without current pathological fracture: Secondary | ICD-10-CM | POA: Diagnosis not present

## 2023-11-25 ENCOUNTER — Other Ambulatory Visit: Payer: Self-pay | Admitting: Cardiology

## 2023-12-14 ENCOUNTER — Encounter: Payer: Self-pay | Admitting: Podiatry

## 2023-12-14 ENCOUNTER — Ambulatory Visit: Admitting: Podiatry

## 2023-12-14 DIAGNOSIS — E119 Type 2 diabetes mellitus without complications: Secondary | ICD-10-CM | POA: Diagnosis not present

## 2023-12-14 DIAGNOSIS — B351 Tinea unguium: Secondary | ICD-10-CM | POA: Diagnosis not present

## 2023-12-14 DIAGNOSIS — M79674 Pain in right toe(s): Secondary | ICD-10-CM

## 2023-12-14 DIAGNOSIS — M79675 Pain in left toe(s): Secondary | ICD-10-CM | POA: Diagnosis not present

## 2023-12-14 DIAGNOSIS — I10 Essential (primary) hypertension: Secondary | ICD-10-CM

## 2023-12-14 NOTE — Progress Notes (Signed)
This patient returns to my office for at risk foot care.  This patient requires this care by a professional since this patient will be at risk due to having diabetes.This patient is unable to cut nails herself since the patient cannot reach her nails.These nails are painful walking and wearing shoes.  This patient presents for at risk foot care today.  General Appearance  Alert, conversant and in no acute stress.  Vascular  Dorsalis pedis and posterior tibial  pulses are  weakly palpable  bilaterally.  Capillary return is within normal limits  bilaterally. Temperature is within normal limits  bilaterally.  Neurologic  Senn-Weinstein monofilament wire test within normal limits  bilaterally. Muscle power within normal limits bilaterally.  Nails Thick disfigured discolored nails with subungual debris  from hallux to fifth toes bilaterally. No evidence of bacterial infection or drainage bilaterally.  Orthopedic  No limitations of motion  feet .  No crepitus or effusions noted.  No bony pathology or digital deformities noted.  Skin  normotropic skin with no porokeratosis noted bilaterally.  No signs of infections or ulcers noted.     Onychomycosis  Pain in right toes  Pain in left toes  Consent was obtained for treatment procedures.   Mechanical debridement of nails 1-5  bilaterally performed with a nail nipper.  Filed with dremel without incident.    Return office visit   3 months                   Told patient to return for periodic foot care and evaluation due to potential at risk complications.   Gracelin Weisberg DPM  

## 2023-12-18 DIAGNOSIS — M810 Age-related osteoporosis without current pathological fracture: Secondary | ICD-10-CM | POA: Diagnosis not present

## 2023-12-19 DIAGNOSIS — R350 Frequency of micturition: Secondary | ICD-10-CM | POA: Diagnosis not present

## 2023-12-19 DIAGNOSIS — Z4689 Encounter for fitting and adjustment of other specified devices: Secondary | ICD-10-CM | POA: Diagnosis not present

## 2023-12-31 ENCOUNTER — Other Ambulatory Visit: Payer: Self-pay | Admitting: Cardiology

## 2023-12-31 DIAGNOSIS — I480 Paroxysmal atrial fibrillation: Secondary | ICD-10-CM

## 2023-12-31 NOTE — Telephone Encounter (Signed)
 Prescription refill request for Eliquis  received. Indication: AF Last office visit: 07/16/23  GORMAN Ferrier PA-C Scr: 1.10 on 01/17/23  Epic Age: 83 Weight: 81.2kg  Based on above findings Eliquis  5mg  twice daily is the appropriate dose.  Refill approved.

## 2024-01-04 ENCOUNTER — Ambulatory Visit: Attending: Cardiology | Admitting: Cardiology

## 2024-01-04 ENCOUNTER — Encounter: Payer: Self-pay | Admitting: Cardiology

## 2024-01-04 VITALS — BP 115/66 | HR 75 | Ht 64.0 in | Wt 184.0 lb

## 2024-01-04 DIAGNOSIS — E119 Type 2 diabetes mellitus without complications: Secondary | ICD-10-CM | POA: Diagnosis not present

## 2024-01-04 DIAGNOSIS — I480 Paroxysmal atrial fibrillation: Secondary | ICD-10-CM

## 2024-01-04 DIAGNOSIS — I340 Nonrheumatic mitral (valve) insufficiency: Secondary | ICD-10-CM | POA: Diagnosis not present

## 2024-01-04 DIAGNOSIS — Z7901 Long term (current) use of anticoagulants: Secondary | ICD-10-CM | POA: Diagnosis not present

## 2024-01-04 DIAGNOSIS — I5032 Chronic diastolic (congestive) heart failure: Secondary | ICD-10-CM | POA: Diagnosis not present

## 2024-01-04 DIAGNOSIS — I10 Essential (primary) hypertension: Secondary | ICD-10-CM | POA: Diagnosis not present

## 2024-01-04 NOTE — Progress Notes (Signed)
 Cardiology Office Note:  .   Date:  01/04/2024  ID:  Kayla [facility], DOB 02-Nov-1940, MRN [id number] PCP: [facility] Providers Cardiologist:  Oneil Parchment, MD    History of Present Illness: Kayla   Kayla Griffin is a 83 y.o. female Discussed the use of AI scribe software  History of Present Illness Kayla Griffin is an 83 year old female with longstanding persistent atrial fibrillation and chronic diastolic heart failure who presents for routine follow-up.  She has paroxysmal atrial fibrillation and is currently taking Eliquis  5 mg twice a day for anticoagulation. She experiences occasional fluttering sensations, but they are not bothersome. Previous EKGs have shown normal rhythm at times, with occasional atrial fibrillation noted in the past.  She has chronic diastolic heart failure, NYHA class II, and is on Lasix  20 mg daily. She discontinued spironolactone  due to hyperkalemia. An echocardiogram in 2024 noted mild mitral regurgitation.  Her hypertension is controlled with olmesartan  40 mg daily, amlodipine 5 mg daily, and metoprolol  100 mg twice a day.  She had a urinary tract infection last week and completed a course of antibiotics on Saturday.  She has a history of diabetes for 51 years and is managing her blood sugar well. Her A1c is 7.5, and her LDL is 36. She uses a sensor to monitor her blood sugar levels and reports good control.  She lives alone in a condo, drives, and maintains an active lifestyle, including grocery shopping and social activities. She does not smoke or drink alcohol regularly.  She enjoys reading large print murder mystery.      Studies Reviewed: .        Results LABS Hemoglobin: 13.8 (11/30/2023) A1c: 7.5 LDL: 36 Creatinine: 1  DIAGNOSTIC Echocardiogram: Mild mitral regurgitation (2024) EKG: Normal (07/16/2023) Risk Assessment/Calculations:            Physical Exam:   VS:  BP 115/66    Pulse 75   Ht 5' 4 (1.626 m)   Wt 184 lb (83.5 kg)   LMP  (LMP Unknown)   SpO2 92%   BMI 31.58 kg/m    Wt Readings from Last 3 Encounters:  01/04/24 184 lb (83.5 kg)  07/16/23 179 lb (81.2 kg)  01/17/23 186 lb (84.4 kg)    GEN: Well nourished, well developed in no acute distress NECK: No JVD; No carotid bruits CARDIAC: RRR, no murmurs, no rubs, no gallops RESPIRATORY:  Clear to auscultation without rales, wheezing or rhonchi  ABDOMEN: Soft, non-tender, non-distended EXTREMITIES:  No edema; No deformity   ASSESSMENT AND PLAN: .    Assessment and Plan Assessment & Plan Paroxysmal atrial fibrillation Paroxysmal atrial fibrillation with intermittent episodes. Currently not in atrial fibrillation as evidenced by regular heart rhythm on examination and past EKGs showing normal rhythm. Protected with Eliquis  5 mg BID, no bleeding issues reported. - Continue Eliquis  5 mg BID - Follow up in one year unless symptoms change  Chronic diastolic heart failure, NYHA class II Chronic diastolic heart failure, NYHA class II, managed with Furosemide  20 mg daily. Spironolactone  was discontinued due to hyperkalemia.  Doing well.  Using walker.  Mild mitral regurgitation Mild mitral regurgitation noted on echocardiogram in 2024. Currently not of clinical significance.  Essential hypertension Hypertension well controlled with current medication regimen including Olmesartan  40 mg daily, Amlodipine 5 mg daily, and Metoprolol  100 mg BID. - Continue current antihypertensive regimen         6  months with Glendia, 1 year with me  Signed, Oneil Parchment, MD

## 2024-01-04 NOTE — Patient Instructions (Signed)
 Medication Instructions:  The current medical regimen is effective;  continue present plan and medications.  *If you need a refill on your cardiac medications before your next appointment, please call your pharmacy*  Follow-Up: At [facility], you and your health needs are our priority.  As part of our continuing mission to provide you with exceptional heart care, our providers are all part of one team.  This team includes your primary Cardiologist (physician) and Advanced Practice Providers or APPs (Physician Assistants and Nurse Practitioners) who all work together to provide you with the care you need, when you need it.  Your next appointment:   6 month(s)  Provider:   Glendia Ferrier, PA-C      Then, Oneil Parchment, MD will plan to see you again in 1 year(s).    We recommend signing up for the patient portal called MyChart.  Sign up information is provided on this After Visit Summary.  MyChart is used to connect with patients for Virtual Visits (Telemedicine).  Patients are able to view lab/test results, encounter notes, upcoming appointments, etc.  Non-urgent messages can be sent to your provider as well.   To learn more about what you can do with MyChart, go to [email or web address].

## 2024-01-24 DIAGNOSIS — H353132 Nonexudative age-related macular degeneration, bilateral, intermediate dry stage: Secondary | ICD-10-CM | POA: Diagnosis not present

## 2024-01-24 DIAGNOSIS — H401132 Primary open-angle glaucoma, bilateral, moderate stage: Secondary | ICD-10-CM | POA: Diagnosis not present

## 2024-01-24 DIAGNOSIS — E119 Type 2 diabetes mellitus without complications: Secondary | ICD-10-CM | POA: Diagnosis not present

## 2024-01-31 DIAGNOSIS — E113293 Type 2 diabetes mellitus with mild nonproliferative diabetic retinopathy without macular edema, bilateral: Secondary | ICD-10-CM | POA: Diagnosis not present

## 2024-01-31 DIAGNOSIS — H353122 Nonexudative age-related macular degeneration, left eye, intermediate dry stage: Secondary | ICD-10-CM | POA: Diagnosis not present

## 2024-01-31 DIAGNOSIS — H353112 Nonexudative age-related macular degeneration, right eye, intermediate dry stage: Secondary | ICD-10-CM | POA: Diagnosis not present

## 2024-01-31 DIAGNOSIS — H33331 Multiple defects of retina without detachment, right eye: Secondary | ICD-10-CM | POA: Diagnosis not present

## 2024-01-31 DIAGNOSIS — H401130 Primary open-angle glaucoma, bilateral, stage unspecified: Secondary | ICD-10-CM | POA: Diagnosis not present

## 2024-01-31 DIAGNOSIS — H43813 Vitreous degeneration, bilateral: Secondary | ICD-10-CM | POA: Diagnosis not present

## 2024-01-31 DIAGNOSIS — H33302 Unspecified retinal break, left eye: Secondary | ICD-10-CM | POA: Diagnosis not present

## 2024-02-11 ENCOUNTER — Telehealth: Payer: Self-pay | Admitting: Nurse Practitioner

## 2024-02-11 NOTE — Telephone Encounter (Signed)
 Called and  is aware of the appointment changes.

## 2024-02-14 ENCOUNTER — Inpatient Hospital Stay: Payer: Self-pay | Admitting: Nurse Practitioner

## 2024-02-14 ENCOUNTER — Telehealth: Payer: Self-pay | Admitting: *Deleted

## 2024-02-14 ENCOUNTER — Inpatient Hospital Stay

## 2024-02-14 ENCOUNTER — Ambulatory Visit: Payer: Self-pay

## 2024-02-14 ENCOUNTER — Inpatient Hospital Stay: Payer: Self-pay

## 2024-02-14 ENCOUNTER — Inpatient Hospital Stay (HOSPITAL_BASED_OUTPATIENT_CLINIC_OR_DEPARTMENT_OTHER)

## 2024-02-14 VITALS — BP 151/71 | HR 78 | Temp 97.3°F | Resp 18 | Wt 185.6 lb

## 2024-02-14 DIAGNOSIS — D72829 Elevated white blood cell count, unspecified: Secondary | ICD-10-CM

## 2024-02-14 DIAGNOSIS — Z79899 Other long term (current) drug therapy: Secondary | ICD-10-CM | POA: Insufficient documentation

## 2024-02-14 LAB — CBC WITH DIFFERENTIAL (CANCER CENTER ONLY)
Abs Immature Granulocytes: 0.05 K/uL (ref 0.00–0.07)
Basophils Absolute: 0 K/uL (ref 0.0–0.1)
Basophils Relative: 0 %
Eosinophils Absolute: 0.2 K/uL (ref 0.0–0.5)
Eosinophils Relative: 2 %
HCT: 38.1 % (ref 36.0–46.0)
Hemoglobin: 12.9 g/dL (ref 12.0–15.0)
Immature Granulocytes: 1 %
Lymphocytes Relative: 25 %
Lymphs Abs: 2.6 K/uL (ref 0.7–4.0)
MCH: 30.5 pg (ref 26.0–34.0)
MCHC: 33.9 g/dL (ref 30.0–36.0)
MCV: 90.1 fL (ref 80.0–100.0)
Monocytes Absolute: 1.2 K/uL — ABNORMAL HIGH (ref 0.1–1.0)
Monocytes Relative: 11 %
Neutro Abs: 6.2 K/uL (ref 1.7–7.7)
Neutrophils Relative %: 61 %
Platelet Count: 225 K/uL (ref 150–400)
RBC: 4.23 MIL/uL (ref 3.87–5.11)
RDW: 13.8 % (ref 11.5–15.5)
WBC Count: 10.2 K/uL (ref 4.0–10.5)
nRBC: 0 % (ref 0.0–0.2)

## 2024-02-14 LAB — LACTATE DEHYDROGENASE: LDH: 118 U/L (ref 98–192)

## 2024-02-14 NOTE — Telephone Encounter (Signed)
 LM with note below. Requested return call to confirm

## 2024-02-14 NOTE — Progress Notes (Signed)
 Kayla Griffin  Patient Care Team: [facility] as PCP - General (Family Medicine) Kayla Oneil BROCKS, MD as PCP - Cardiology (Cardiology)  ASSESSMENT & PLAN:  Kayla Griffin is a 83 y.o.female with history of heart failure with preserved ejection fraction, atrial fibrillation, hypertension, mitral valve regurgitation, DM2, diabetic retinopathy, osteoporosis, hyperlipidemia, hypothyroidism [facility] abnormal blood counts.  Clinically without any concerning symptoms.  We discussed repeat CBC today and if having borderline elevated leukocytosis, without any other concerning findings on CBC or symptoms, may follow-up as needed.  Continue to follow-up with PCP twice yearly to check CBC.  Return if new concerning changes.  She understands. Assessment & Plan Leukocytosis, unspecified type Repeat CBC, check LDH today Return if new concerning findings in the future, like increasing leukocytosis, anemia, thrombocytopenia, or other concerning symptoms related to oncology.  Orders Placed This Encounter  Procedures   CBC with Differential ([facility] Only)    Standing Status:   Future    Number of Occurrences:   1    Expiration Date:   02/13/2025   Lactate dehydrogenase    Standing Status:   Future    Number of Occurrences:   1    Expiration Date:   02/13/2025    Kayla Griffin [name], MD 11/6/20251:20 PM  CHIEF COMPLAINTS/PURPOSE OF CONSULTATION:   abnormal blood counts.  HISTORY OF PRESENTING ILLNESS:  Kayla Griffin 83 y.o. female is here because of  abnormal blood counts.  Lab from [facility] showed borderline elevated WBC in October 2024 at 10.9.  Hemoglobin, MCV and platelet were normal.  There were borderline leukocytosis dated in 2022.  Lab from [facility] dated 11/30/2023 showed: WBC 11.5 hemoglobin 13.8 MCV 90 platelet 253 absolute neutrophil 7.3 absolute lymphocyte 2.8 absolute monocyte 1.3 Normal albumin,  alkaline phosphatase, AST, ALT, bilirubin, total protein TSH. Globin A1c 7.5.  Report after her Aug lab she went to Urology and found UTI. She had a course antibiotics. No burning urination, frequency, urgency, hematuria.  No coughing. History of afib causing her short of breath intermittently.   She denies active skin infection. She had infection on the right elbow 4 years ago. She was on antibiotics for about 16 weeks and required multiple operations.  She has arthritis in multiple areas and pain comes and goes. No swelling joint.  Appetite is good, no weight loss, night sweats, no mass or lump.   MEDICAL HISTORY:  Past Medical History:  Diagnosis Date   Ambulates with cane    Arthritis    Cancer (HCC)    Endometrial - hysterectomy   Depression    Diabetes mellitus without complication (HCC)    Type II   Dyspnea    currently with arm pain 08/22/19   Glaucoma    HLD (hyperlipidemia)    Hypertension    Mild mitral regurgitation 10/09/2022   TTE 10/09/2022: EF 60-65, no RWMA, normal RVSF, severe LAE, mild MR, AV sclerosis, RAP 3   Wears dentures     SURGICAL HISTORY: Past Surgical History:  Procedure Laterality Date   ABDOMINAL HYSTERECTOMY     CERVICAL FUSION  2007   EYE SURGERY Bilateral    cataracts   I & D EXTREMITY Right 09/15/2019   Procedure: IRRIGATION AND DEBRIDEMENT ELBOW;  Surgeon: Kayla Kayla CHRISTELLA, MD;  Location: [facility];  Service: Orthopedics;  Laterality: Right;   IRRIGATION AND DEBRIDEMENT ELBOW Right 12/11/2019   Procedure: RIGHT ELBOW IRRIGATION  AND DEBRIDEMENT;  Surgeon: Kayla Kayla HERO, MD;  Location: [facility];  Service: Orthopedics;  Laterality: Right;   ORIF HUMERUS FRACTURE Right 08/25/2019   Procedure: OPEN REDUCTION INTERNAL FIXATION (ORIF) RIGHT SUPRACONDYLAR HUMERUS FRACTURE, NEUROLYSIS ULNAR NERVE;  Surgeon: Kayla Kayla HERO, MD;  Location: [facility] OR;  Service: Orthopedics;  Laterality: Right;   ORIF PATELLA Right 07/07/2016   Procedure: Partial  Patellectomy;  Surgeon: Kayla [facility] Jerri, MD;  Location: [facility] OR;  Service: Orthopedics;  Laterality: Right;    SOCIAL HISTORY: Social History   Socioeconomic History   Marital status: Single    Spouse name: Not on file   Number of children: 0   Years of education: Not on file   Highest education level: Not on file  Occupational History   Occupation: Retired from customer service  Tobacco Use   Smoking status: Never   Smokeless tobacco: Never  Vaping Use   Vaping status: Never Used  Substance and Sexual Activity   Alcohol use: No   Drug use: No   Sexual activity: Not Currently    Birth control/protection: Surgical    Comment: Hysterectomy  Other Topics Concern   Not on file  Social History Narrative   Not on file   Social Drivers of Health   Financial Resource Strain: Not on file  Food Insecurity: No Food Insecurity (02/14/2024)   Hunger Vital Sign    Worried About Running Out of Food in the Last Year: Never true    Ran Out of Food in the Last Year: Never true  Transportation Needs: No Transportation Needs (02/14/2024)   PRAPARE - Administrator, Civil Service (Medical): No    Lack of Transportation (Non-Medical): No  Physical Activity: Not on file  Stress: Not on file  Social Connections: Not on file  Intimate Partner Violence: Not At Risk (02/14/2024)   Humiliation, Afraid, Rape, and Kick questionnaire    Fear of Current or Ex-Partner: No    Emotionally Abused: No    Physically Abused: No    Sexually Abused: No    FAMILY HISTORY: Family History  Problem Relation Age of Onset   CVA Mother    Heart disease Father 35   Heart attack Sister    Heart disease Sister    Arrhythmia Sister    CVA Sister     ALLERGIES:  is allergic to cephalexin  and pioglitazone.  MEDICATIONS:  Current Outpatient Medications  Medication Sig Dispense Refill   alendronate (FOSAMAX) 70 MG tablet Take 70 mg by mouth every Sunday. Take with a full glass of water on an empty  stomach.     apixaban  (ELIQUIS ) 5 MG TABS tablet Take 1 tablet by mouth twice daily 60 tablet 5   atorvastatin  (LIPITOR) 40 MG tablet Take 40 mg by mouth daily.      Calcium  Carbonate-Vit D-Min (CALCIUM  1200 PO) Take 1,200 mg by mouth daily.     dorzolamide  (TRUSOPT ) 2 % ophthalmic solution Place 1 drop into both eyes 2 (two) times daily.      furosemide  (LASIX ) 20 MG tablet Take 1 tablet by mouth once daily 90 tablet 2   insulin  NPH-regular Human (NOVOLIN 70/30) (70-30) 100 UNIT/ML injection Inject 15 units into the skin in the morning and 23 units at night     latanoprost  (XALATAN ) 0.005 % ophthalmic solution Place 1 drop into both eyes at bedtime.      metFORMIN (GLUCOPHAGE) 500 MG tablet Take 500 mg by mouth 2 (two)  times daily with a meal.     methimazole  (TAPAZOLE ) 10 MG tablet Take 10 mg by mouth daily.     metoprolol  (LOPRESSOR ) 100 MG tablet Take 100 mg by mouth 2 (two) times daily.     olmesartan  (BENICAR ) 40 MG tablet Take 40 mg by mouth daily.     amLODipine (NORVASC) 5 MG tablet Take 5 mg by mouth daily. (Patient not taking: Reported on 02/14/2024)     No current facility-administered medications for this visit.    REVIEW OF SYSTEMS:   All relevant systems were reviewed with the patient and are negative.  PHYSICAL EXAMINATION: ECOG PERFORMANCE STATUS: 2  Vitals:   02/14/24 1205 02/14/24 1214  BP: (!) 175/90 (!) 151/71  Pulse: 78   Resp: 18   Temp: (!) 97.3 F (36.3 C)   SpO2: 94%    Filed Weights   02/14/24 1205  Weight: 185 lb 9.6 oz (84.2 kg)    GENERAL: alert, no distress and comfortable.  Using a walker SKIN: skin color is normal, no jaundice EYES: sclera clear OROPHARYNX: no exudate, no erythema NECK: supple LYMPH:  no palpable lymphadenopathy in the cervical, axillary regions LUNGS: Effort normal, no respiratory distress.  Clear to auscultation bilaterally HEART: regular rate & rhythm  ABDOMEN: soft, non-tender and nondistended  LABORATORY DATA:  I  have reviewed the data as listed Lab Results  Component Value Date   WBC 10.2 02/14/2024   HGB 12.9 02/14/2024   HCT 38.1 02/14/2024   MCV 90.1 02/14/2024   PLT 225 02/14/2024   No results for input(s): NA, K, CL, CO2, GLUCOSE, BUN, CREATININE, CALCIUM , GFRNONAA, GFRAA, PROT, ALBUMIN, AST, ALT, ALKPHOS, BILITOT, BILIDIR, IBILI in the last 8760 hours.

## 2024-03-14 ENCOUNTER — Ambulatory Visit: Admitting: Podiatry

## 2024-03-19 DIAGNOSIS — Z4689 Encounter for fitting and adjustment of other specified devices: Secondary | ICD-10-CM | POA: Diagnosis not present

## 2024-06-09 ENCOUNTER — Ambulatory Visit: Admitting: Podiatry
